# Patient Record
Sex: Female | Born: 1950 | Race: Black or African American | Hispanic: No | Marital: Married | Smoking: Former smoker
Health system: Southern US, Community
[De-identification: ages and names within clinical notes are randomized; demographics above are authoritative.]

## PROBLEM LIST (undated history)

## (undated) DIAGNOSIS — T7840XA Allergy, unspecified, initial encounter: Secondary | ICD-10-CM

## (undated) DIAGNOSIS — R7309 Other abnormal glucose: Secondary | ICD-10-CM

## (undated) DIAGNOSIS — R112 Nausea with vomiting, unspecified: Secondary | ICD-10-CM

## (undated) DIAGNOSIS — C801 Malignant (primary) neoplasm, unspecified: Secondary | ICD-10-CM

## (undated) DIAGNOSIS — J069 Acute upper respiratory infection, unspecified: Secondary | ICD-10-CM

## (undated) DIAGNOSIS — Z8041 Family history of malignant neoplasm of ovary: Secondary | ICD-10-CM

## (undated) DIAGNOSIS — D649 Anemia, unspecified: Secondary | ICD-10-CM

## (undated) DIAGNOSIS — I10 Essential (primary) hypertension: Secondary | ICD-10-CM

## (undated) DIAGNOSIS — M8580 Other specified disorders of bone density and structure, unspecified site: Secondary | ICD-10-CM

## (undated) DIAGNOSIS — Z9889 Other specified postprocedural states: Secondary | ICD-10-CM

## (undated) DIAGNOSIS — M1990 Unspecified osteoarthritis, unspecified site: Secondary | ICD-10-CM

## (undated) DIAGNOSIS — Z803 Family history of malignant neoplasm of breast: Secondary | ICD-10-CM

## (undated) DIAGNOSIS — M25559 Pain in unspecified hip: Secondary | ICD-10-CM

## (undated) HISTORY — DX: Other abnormal glucose: R73.09

## (undated) HISTORY — PX: COLONOSCOPY: SHX174

## (undated) HISTORY — DX: Other specified disorders of bone density and structure, unspecified site: M85.80

## (undated) HISTORY — DX: Essential (primary) hypertension: I10

## (undated) HISTORY — PX: COLONOSCOPY: SHX5424

## (undated) HISTORY — DX: Anemia, unspecified: D64.9

## (undated) HISTORY — DX: Family history of malignant neoplasm of breast: Z80.3

## (undated) HISTORY — DX: Other specified postprocedural states: R11.2

## (undated) HISTORY — DX: Family history of malignant neoplasm of ovary: Z80.41

## (undated) HISTORY — DX: Acute upper respiratory infection, unspecified: J06.9

## (undated) HISTORY — DX: Unspecified osteoarthritis, unspecified site: M19.90

## (undated) HISTORY — DX: Pain in unspecified hip: M25.559

## (undated) HISTORY — PX: WISDOM TOOTH EXTRACTION: SHX21

## (undated) HISTORY — DX: Allergy, unspecified, initial encounter: T78.40XA

## (undated) HISTORY — DX: Other specified postprocedural states: Z98.890

## (undated) HISTORY — PX: POLYPECTOMY: SHX149

---

## 1999-12-03 ENCOUNTER — Emergency Department (HOSPITAL_COMMUNITY): Admission: EM | Admit: 1999-12-03 | Discharge: 1999-12-04 | Payer: Self-pay | Admitting: Emergency Medicine

## 1999-12-04 ENCOUNTER — Encounter: Payer: Self-pay | Admitting: Emergency Medicine

## 2000-01-14 ENCOUNTER — Other Ambulatory Visit: Admission: RE | Admit: 2000-01-14 | Discharge: 2000-01-14 | Payer: Self-pay | Admitting: Internal Medicine

## 2002-12-17 ENCOUNTER — Other Ambulatory Visit: Admission: RE | Admit: 2002-12-17 | Discharge: 2002-12-17 | Payer: Self-pay | Admitting: Internal Medicine

## 2002-12-27 ENCOUNTER — Encounter: Admission: RE | Admit: 2002-12-27 | Discharge: 2002-12-27 | Payer: Self-pay | Admitting: Internal Medicine

## 2002-12-31 ENCOUNTER — Encounter: Payer: Self-pay | Admitting: Gastroenterology

## 2004-09-22 ENCOUNTER — Ambulatory Visit: Payer: Self-pay | Admitting: Internal Medicine

## 2005-01-12 ENCOUNTER — Ambulatory Visit: Payer: Self-pay | Admitting: Internal Medicine

## 2005-07-13 ENCOUNTER — Ambulatory Visit: Payer: Self-pay | Admitting: Internal Medicine

## 2005-10-19 ENCOUNTER — Encounter: Admission: RE | Admit: 2005-10-19 | Discharge: 2005-10-19 | Payer: Self-pay | Admitting: Internal Medicine

## 2006-08-17 ENCOUNTER — Ambulatory Visit: Payer: Self-pay | Admitting: Internal Medicine

## 2006-08-17 LAB — CONVERTED CEMR LAB
ALT: 17 units/L (ref 0–40)
AST: 22 units/L (ref 0–37)
Albumin: 3.7 g/dL (ref 3.5–5.2)
Alkaline Phosphatase: 73 units/L (ref 39–117)
BUN: 12 mg/dL (ref 6–23)
Basophils Absolute: 0 10*3/uL (ref 0.0–0.1)
Basophils Relative: 0.7 % (ref 0.0–1.0)
Bilirubin Urine: NEGATIVE
Bilirubin, Direct: 0.1 mg/dL (ref 0.0–0.3)
CO2: 29 meq/L (ref 19–32)
Calcium: 9 mg/dL (ref 8.4–10.5)
Chloride: 111 meq/L (ref 96–112)
Cholesterol: 203 mg/dL (ref 0–200)
Creatinine, Ser: 0.8 mg/dL (ref 0.4–1.2)
Direct LDL: 85.1 mg/dL
Eosinophils Absolute: 0.1 10*3/uL (ref 0.0–0.6)
Eosinophils Relative: 2.4 % (ref 0.0–5.0)
GFR calc Af Amer: 96 mL/min
GFR calc non Af Amer: 79 mL/min
Glucose, Bld: 88 mg/dL (ref 70–99)
HCT: 34.9 % — ABNORMAL LOW (ref 36.0–46.0)
HDL: 87 mg/dL (ref >39.0)
Hemoglobin, Urine: NEGATIVE
Hemoglobin: 11.9 g/dL — ABNORMAL LOW (ref 12.0–15.0)
Ketones, ur: NEGATIVE mg/dL
Leukocytes, UA: NEGATIVE
Lymphocytes Relative: 32.1 % (ref 12.0–46.0)
MCHC: 34.1 g/dL (ref 30.0–36.0)
MCV: 83.9 fL (ref 78.0–100.0)
Monocytes Absolute: 0.4 10*3/uL (ref 0.2–0.7)
Monocytes Relative: 7.4 % (ref 3.0–11.0)
Neutro Abs: 3 10*3/uL (ref 1.4–7.7)
Neutrophils Relative %: 57.4 % (ref 43.0–77.0)
Nitrite: NEGATIVE
Platelets: 270 10*3/uL (ref 150–400)
Potassium: 4.1 meq/L (ref 3.5–5.1)
RBC: 4.16 M/uL (ref 3.87–5.11)
RDW: 12.9 % (ref 11.5–14.6)
Sodium: 141 meq/L (ref 135–145)
Specific Gravity, Urine: >=1.03 (ref 1.000–1.03)
TSH: 1.75 microintl units/mL (ref 0.35–5.50)
Total Bilirubin: 0.2 mg/dL — ABNORMAL LOW (ref 0.3–1.2)
Total CHOL/HDL Ratio: 2.3
Total Protein, Urine: NEGATIVE mg/dL
Total Protein: 6.6 g/dL (ref 6.0–8.3)
Triglycerides: 47 mg/dL (ref 0–149)
Urine Glucose: NEGATIVE mg/dL
Urobilinogen, UA: 0.2 (ref 0.0–1.0)
VLDL: 9 mg/dL (ref 0–40)
WBC: 5.2 10*3/uL (ref 4.5–10.5)
pH: 5 (ref 5.0–8.0)

## 2006-08-23 ENCOUNTER — Ambulatory Visit: Payer: Self-pay | Admitting: Internal Medicine

## 2007-10-31 ENCOUNTER — Ambulatory Visit: Payer: Self-pay | Admitting: Internal Medicine

## 2007-10-31 LAB — CONVERTED CEMR LAB
ALT: 19 units/L (ref 0–35)
AST: 22 units/L (ref 0–37)
Albumin: 3.5 g/dL (ref 3.5–5.2)
Alkaline Phosphatase: 83 units/L (ref 39–117)
BUN: 12 mg/dL (ref 6–23)
Basophils Absolute: 0 10*3/uL (ref 0.0–0.1)
Basophils Relative: 0.9 % (ref 0.0–3.0)
Bilirubin Urine: NEGATIVE
Bilirubin, Direct: 0.1 mg/dL (ref 0.0–0.3)
CO2: 32 meq/L (ref 19–32)
Calcium: 8.9 mg/dL (ref 8.4–10.5)
Chloride: 110 meq/L (ref 96–112)
Cholesterol: 182 mg/dL (ref 0–200)
Creatinine, Ser: 0.7 mg/dL (ref 0.4–1.2)
Eosinophils Absolute: 0.3 10*3/uL (ref 0.0–0.7)
Eosinophils Relative: 5.4 % — ABNORMAL HIGH (ref 0.0–5.0)
GFR calc Af Amer: 111 mL/min
GFR calc non Af Amer: 92 mL/min
Glucose, Bld: 100 mg/dL — ABNORMAL HIGH (ref 70–99)
HCT: 35 % — ABNORMAL LOW (ref 36.0–46.0)
HDL: 60 mg/dL (ref >39.0)
Hemoglobin: 11.9 g/dL — ABNORMAL LOW (ref 12.0–15.0)
Ketones, ur: NEGATIVE mg/dL
LDL Cholesterol: 114 mg/dL — ABNORMAL HIGH (ref 0–99)
Leukocytes, UA: NEGATIVE
Lymphocytes Relative: 29.2 % (ref 12.0–46.0)
MCHC: 34.1 g/dL (ref 30.0–36.0)
MCV: 85.5 fL (ref 78.0–100.0)
Monocytes Absolute: 0.4 10*3/uL (ref 0.1–1.0)
Monocytes Relative: 8.5 % (ref 3.0–12.0)
Neutro Abs: 2.6 10*3/uL (ref 1.4–7.7)
Neutrophils Relative %: 56 % (ref 43.0–77.0)
Nitrite: NEGATIVE
Platelets: 286 10*3/uL (ref 150–400)
Potassium: 4.4 meq/L (ref 3.5–5.1)
RBC: 4.09 M/uL (ref 3.87–5.11)
RDW: 13.6 % (ref 11.5–14.6)
Sodium: 143 meq/L (ref 135–145)
Specific Gravity, Urine: 1.025 (ref 1.000–1.03)
TSH: 1.82 microintl units/mL (ref 0.35–5.50)
Total Bilirubin: 0.6 mg/dL (ref 0.3–1.2)
Total CHOL/HDL Ratio: 3
Total Protein, Urine: NEGATIVE mg/dL
Total Protein: 6.4 g/dL (ref 6.0–8.3)
Triglycerides: 39 mg/dL (ref 0–149)
Urine Glucose: NEGATIVE mg/dL
Urobilinogen, UA: 0.2 (ref 0.0–1.0)
VLDL: 8 mg/dL (ref 0–40)
WBC: 4.7 10*3/uL (ref 4.5–10.5)
pH: 6 (ref 5.0–8.0)

## 2007-11-07 ENCOUNTER — Ambulatory Visit: Payer: Self-pay | Admitting: Internal Medicine

## 2007-11-07 DIAGNOSIS — I10 Essential (primary) hypertension: Secondary | ICD-10-CM | POA: Insufficient documentation

## 2007-11-07 DIAGNOSIS — M1990 Unspecified osteoarthritis, unspecified site: Secondary | ICD-10-CM

## 2007-11-07 DIAGNOSIS — D649 Anemia, unspecified: Secondary | ICD-10-CM

## 2007-11-07 HISTORY — DX: Anemia, unspecified: D64.9

## 2007-11-07 HISTORY — DX: Essential (primary) hypertension: I10

## 2007-11-07 HISTORY — DX: Unspecified osteoarthritis, unspecified site: M19.90

## 2008-01-02 ENCOUNTER — Ambulatory Visit: Payer: Self-pay | Admitting: Gastroenterology

## 2008-01-08 ENCOUNTER — Ambulatory Visit: Payer: Self-pay | Admitting: Gastroenterology

## 2008-01-08 LAB — HM COLONOSCOPY

## 2008-03-12 HISTORY — PX: VAGINAL HYSTERECTOMY: SUR661

## 2008-03-19 ENCOUNTER — Ambulatory Visit (HOSPITAL_COMMUNITY): Admission: RE | Admit: 2008-03-19 | Discharge: 2008-03-20 | Payer: Self-pay | Admitting: Obstetrics and Gynecology

## 2008-03-19 ENCOUNTER — Encounter (INDEPENDENT_AMBULATORY_CARE_PROVIDER_SITE_OTHER): Payer: Self-pay | Admitting: Obstetrics and Gynecology

## 2008-12-24 ENCOUNTER — Ambulatory Visit: Payer: Self-pay | Admitting: Internal Medicine

## 2008-12-24 LAB — CONVERTED CEMR LAB
ALT: 14 units/L (ref 0–35)
AST: 18 units/L (ref 0–37)
Albumin: 3.9 g/dL (ref 3.5–5.2)
Alkaline Phosphatase: 104 units/L (ref 39–117)
BUN: 20 mg/dL (ref 6–23)
Basophils Absolute: 0 10*3/uL (ref 0.0–0.1)
Basophils Relative: 0 % (ref 0.0–3.0)
Bilirubin Urine: NEGATIVE
Bilirubin, Direct: 0.1 mg/dL (ref 0.0–0.3)
CO2: 28 meq/L (ref 19–32)
Calcium: 8.9 mg/dL (ref 8.4–10.5)
Chloride: 107 meq/L (ref 96–112)
Cholesterol: 186 mg/dL (ref 0–200)
Creatinine, Ser: 0.3 mg/dL — ABNORMAL LOW (ref 0.4–1.2)
Eosinophils Absolute: 0.3 10*3/uL (ref 0.0–0.7)
Eosinophils Relative: 4.5 % (ref 0.0–5.0)
GFR calc non Af Amer: 294.17 mL/min (ref >60)
Glucose, Bld: 87 mg/dL (ref 70–99)
HCT: 35.6 % — ABNORMAL LOW (ref 36.0–46.0)
HDL: 79.4 mg/dL (ref >39.00)
Hemoglobin: 11.9 g/dL — ABNORMAL LOW (ref 12.0–15.0)
Ketones, ur: NEGATIVE mg/dL
LDL Cholesterol: 100 mg/dL — ABNORMAL HIGH (ref 0–99)
Leukocytes, UA: NEGATIVE
Lymphocytes Relative: 28.9 % (ref 12.0–46.0)
Lymphs Abs: 1.8 10*3/uL (ref 0.7–4.0)
MCHC: 33.4 g/dL (ref 30.0–36.0)
MCV: 85.7 fL (ref 78.0–100.0)
Monocytes Absolute: 0.7 10*3/uL (ref 0.1–1.0)
Monocytes Relative: 10.5 % (ref 3.0–12.0)
Neutro Abs: 3.5 10*3/uL (ref 1.4–7.7)
Neutrophils Relative %: 56.1 % (ref 43.0–77.0)
Nitrite: NEGATIVE
Platelets: 243 10*3/uL (ref 150.0–400.0)
Potassium: 4 meq/L (ref 3.5–5.1)
RBC: 4.15 M/uL (ref 3.87–5.11)
RDW: 13.1 % (ref 11.5–14.6)
Sodium: 143 meq/L (ref 135–145)
Specific Gravity, Urine: >=1.03 (ref 1.000–1.030)
TSH: 1.31 microintl units/mL (ref 0.35–5.50)
Total Bilirubin: 0.6 mg/dL (ref 0.3–1.2)
Total CHOL/HDL Ratio: 2
Total Protein, Urine: NEGATIVE mg/dL
Total Protein: 6.8 g/dL (ref 6.0–8.3)
Triglycerides: 34 mg/dL (ref 0.0–149.0)
Urine Glucose: NEGATIVE mg/dL
Urobilinogen, UA: 0.2 (ref 0.0–1.0)
VLDL: 6.8 mg/dL (ref 0.0–40.0)
WBC: 6.3 10*3/uL (ref 4.5–10.5)
pH: 5 (ref 5.0–8.0)

## 2008-12-30 ENCOUNTER — Ambulatory Visit: Payer: Self-pay | Admitting: Internal Medicine

## 2009-04-01 LAB — HM MAMMOGRAPHY: HM Mammogram: NORMAL

## 2009-08-08 ENCOUNTER — Ambulatory Visit: Payer: Self-pay | Admitting: Endocrinology

## 2009-08-08 DIAGNOSIS — J069 Acute upper respiratory infection, unspecified: Secondary | ICD-10-CM | POA: Insufficient documentation

## 2009-08-08 HISTORY — DX: Acute upper respiratory infection, unspecified: J06.9

## 2010-02-02 ENCOUNTER — Ambulatory Visit: Payer: Self-pay | Admitting: Internal Medicine

## 2010-02-02 LAB — CONVERTED CEMR LAB
ALT: 14 units/L (ref 0–35)
AST: 17 units/L (ref 0–37)
Albumin: 4 g/dL (ref 3.5–5.2)
Alkaline Phosphatase: 102 units/L (ref 39–117)
BUN: 14 mg/dL (ref 6–23)
Basophils Absolute: 0 10*3/uL (ref 0.0–0.1)
Basophils Relative: 0.5 % (ref 0.0–3.0)
Bilirubin Urine: NEGATIVE
Bilirubin, Direct: 0.1 mg/dL (ref 0.0–0.3)
CO2: 30 meq/L (ref 19–32)
Calcium: 9.4 mg/dL (ref 8.4–10.5)
Chloride: 100 meq/L (ref 96–112)
Cholesterol: 199 mg/dL (ref 0–200)
Creatinine, Ser: 0.7 mg/dL (ref 0.4–1.2)
Eosinophils Absolute: 0.2 10*3/uL (ref 0.0–0.7)
Eosinophils Relative: 2.3 % (ref 0.0–5.0)
GFR calc non Af Amer: 108.44 mL/min (ref >30.00)
Glucose, Bld: 133 mg/dL — ABNORMAL HIGH (ref 70–99)
HCT: 35.8 % — ABNORMAL LOW (ref 36.0–46.0)
HDL: 83.1 mg/dL (ref >39.00)
Hemoglobin: 12.1 g/dL (ref 12.0–15.0)
Ketones, ur: NEGATIVE mg/dL
LDL Cholesterol: 105 mg/dL — ABNORMAL HIGH (ref 0–99)
Leukocytes, UA: NEGATIVE
Lymphocytes Relative: 26.3 % (ref 12.0–46.0)
Lymphs Abs: 1.7 10*3/uL (ref 0.7–4.0)
MCHC: 33.9 g/dL (ref 30.0–36.0)
MCV: 84.2 fL (ref 78.0–100.0)
Monocytes Absolute: 0.6 10*3/uL (ref 0.1–1.0)
Monocytes Relative: 8.6 % (ref 3.0–12.0)
Neutro Abs: 4.1 10*3/uL (ref 1.4–7.7)
Neutrophils Relative %: 62.3 % (ref 43.0–77.0)
Nitrite: NEGATIVE
Platelets: 265 10*3/uL (ref 150.0–400.0)
Potassium: 4 meq/L (ref 3.5–5.1)
RBC: 4.25 M/uL (ref 3.87–5.11)
RDW: 14 % (ref 11.5–14.6)
Sodium: 138 meq/L (ref 135–145)
Specific Gravity, Urine: 1.02 (ref 1.000–1.030)
TSH: 1.37 microintl units/mL (ref 0.35–5.50)
Total Bilirubin: 0.6 mg/dL (ref 0.3–1.2)
Total CHOL/HDL Ratio: 2
Total Protein, Urine: NEGATIVE mg/dL
Total Protein: 6.6 g/dL (ref 6.0–8.3)
Triglycerides: 55 mg/dL (ref 0.0–149.0)
Urine Glucose: NEGATIVE mg/dL
Urobilinogen, UA: 0.2 (ref 0.0–1.0)
VLDL: 11 mg/dL (ref 0.0–40.0)
WBC: 6.6 10*3/uL (ref 4.5–10.5)
pH: 6 (ref 5.0–8.0)

## 2010-02-06 ENCOUNTER — Encounter: Payer: Self-pay | Admitting: Internal Medicine

## 2010-02-06 ENCOUNTER — Ambulatory Visit: Payer: Self-pay | Admitting: Internal Medicine

## 2010-02-06 DIAGNOSIS — M25559 Pain in unspecified hip: Secondary | ICD-10-CM

## 2010-02-06 DIAGNOSIS — R7309 Other abnormal glucose: Secondary | ICD-10-CM

## 2010-02-06 HISTORY — DX: Pain in unspecified hip: M25.559

## 2010-02-06 HISTORY — DX: Other abnormal glucose: R73.09

## 2010-03-31 NOTE — Assessment & Plan Note (Signed)
Summary: DR PLOTNIKOV PT/NO SLOT COUGH- SORE THROAT STC   Vital Signs:  Patient profile:   60 year old female Height:      64 inches Weight:      137.50 pounds BMI:     23.69 O2 Sat:      96 % on Room air Temp:     99.1 degrees F oral Pulse rate:   100 / minute BP sitting:   124 / 86  (left arm) Cuff size:   regular  Vitals Entered By: Lucious Groves (August 08, 2009 4:08 PM)  O2 Flow:  Room air CC: C/O cold/sore throat x6 days./kb Is Patient Diabetic? No Pain Assessment Patient in pain? no      Comments Patient notes that she has been having fever, and yellow mucous producing cough./kb   CC:  C/O cold/sore throat x6 days./kb.  History of Present Illness: pt states 1 week of headache, chills, sore throat, prod cough, chills, nasal congestion.  fever is resolved.  Current Medications (verified): 1)  Norvasc 2.5 Mg  Tabs (Amlodipine Besylate) .... Once Daily 2)  Vitamin D3 1000 Unit  Tabs (Cholecalciferol) .Marland Kitchen.. 1 By Mouth Daily  Allergies (verified): No Known Drug Allergies  Past History:  Past Medical History: Last updated: 11/07/2007 Hypertension Osteoarthritis, hips B troch bursitis GYN DR Huntley Dec GI Dr Russella Dar Anemia-NOS,mild very long time  Social History: Reviewed history from 11/07/2007 and no changes required. 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  Physical Exam  General:  normal appearance.   Head:  head: no deformity eyes: no periorbital swelling, no proptosis external nose and ears are normal mouth: no lesion seen Neck:  Supple without thyroid enlargement or tenderness.  Lungs:  Clear to auscultation bilaterally. Normal respiratory effort.    Impression & Recommendations:  Problem # 1:  URI (ICD-465.9) Assessment New  Medications Added to Medication List This Visit: 1)  Estrace 0.1 Mg/gm Crea (Estradiol) .... Twice a week 2)  Azithromycin 500  Mg Tabs (Azithromycin) .Marland Kitchen.. 1 once daily 3)  Promethazine-codeine 6.25-10 Mg/41ml Syrp (Promethazine-codeine) .Marland Kitchen.. 1-2 teaspoon every 4 hrs as needed for cough  Other Orders: Est. Patient Level III (24401)  Patient Instructions: 1)  azithromycin 500 mg once daily 2)  loratadine-d (non-prescription) as needed for congestion. 3)  promethazine-codeine syrup as needed for cough. Prescriptions: PROMETHAZINE-CODEINE 6.25-10 MG/5ML SYRP (PROMETHAZINE-CODEINE) 1-2 teaspoon every 4 hrs as needed for cough  #8 oz x 1   Entered and Authorized by:   Minus Breeding MD   Signed by:   Minus Breeding MD on 08/08/2009   Method used:   Print then Give to Patient   RxID:   [id number] AZITHROMYCIN 500 MG TABS (AZITHROMYCIN) 1 once daily  #6 x 0   Entered and Authorized by:   Minus Breeding MD   Signed by:   Minus Breeding MD on 08/08/2009   Method used:   Electronically to        [facility]  [address] [phone]* (retail)       [address]       [place], [place]  [id number]       Ph: [phone] or [phone]       Fax: [phone]   RxID:   [id number]

## 2010-04-02 NOTE — Assessment & Plan Note (Signed)
Summary: CPX-LB   Vital Signs:  Patient profile:   60 year old female Height:      64 inches Weight:      141 pounds Temp:     98.1 degrees F oral Pulse rate:   80 / minute Pulse rhythm:   regular Resp:     16 per minute BP sitting:   120 / 70  Vitals Entered By: Lanier Prude, CMA(AAMA) (February 06, 2010 1:59 PM)  History of Present Illness: The patient presents for a preventive health examination  B hips are sore  Allergies: No Known Drug Allergies  Past History:  Past Medical History: Last updated: 11/07/2007 Hypertension Osteoarthritis, hips B troch bursitis GYN DR Huntley Dec GI Dr Russella Dar Anemia-NOS,mild very long time  Past Surgical History: Last updated: 12/30/2008  Hysterectomy, complete 2010 Dr Mirian Capuchin  Family History: Last updated: 11/07/2007 Family History Hypertension No CAD  Social History: Last updated: 11/07/2007 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  The patient denies anorexia, fever, weight loss, weight gain, vision loss, decreased hearing, hoarseness, chest pain, syncope, dyspnea on exertion, peripheral edema, prolonged cough, headaches, hemoptysis, abdominal pain, melena, hematochezia, severe indigestion/heartburn, hematuria, incontinence, genital sores, muscle weakness, suspicious skin lesions, transient blindness, depression, unusual weight change, abnormal bleeding, enlarged lymph nodes, angioedema, and breast masses.    Physical Exam  General:  Well-developed,well-nourished,in no acute distress; alert,appropriate and cooperative throughout examination Head:  Normocephalic and atraumatic without obvious abnormalities. No apparent alopecia or balding. Eyes:  No corneal or conjunctival inflammation noted. EOMI. Perrla. l. Ears:  External ear exam shows no significant lesions or deformities.  Otoscopic examination reveals  clear canals, tympanic membranes are intact bilaterally without bulging, retraction, inflammation or discharge. Hearing is grossly normal bilaterally. Nose:  External nasal examination shows no deformity or inflammation. Nasal mucosa are pink and moist without lesions or exudates. Mouth:  Oral mucosa and oropharynx without lesions or exudates.  Teeth in good repair. Neck:  No deformities, masses, or tenderness noted. Lungs:  Normal respiratory effort, chest expands symmetrically. Lungs are clear to auscultation, no crackles or wheezes. Heart:  Normal rate and regular rhythm. S1 and S2 normal without gallop, murmur, click, rub or other extra sounds. Abdomen:  Bowel sounds positive,abdomen soft and non-tender without masses, organomegaly or hernias noted. Msk:  No deformity or scoliosis noted of thoracic or lumbar spine.  B lat hips are tender Pulses:  R and L carotid,radial,femoral,dorsalis pedis and posterior tibial pulses are full and equal bilaterally Extremities:  No clubbing, cyanosis, edema, or deformity noted with normal full range of motion of all joints.   Neurologic:  No cranial nerve deficits noted. Station and gait are normal. Plantar reflexes are down-going bilaterally. DTRs are symmetrical throughout. Sensory, motor and coordinative functions appear intact. Skin:  Intact without suspicious lesions or rashes Cervical Nodes:  No lymphadenopathy noted Inguinal Nodes:  No significant adenopathy Psych:  Cognition and judgment appear intact. Alert and cooperative with normal attention span and concentration. No apparent delusions, illusions, hallucinations   Impression & Recommendations:  Problem # 1:  WELL ADULT EXAM (ICD-V70.0) Assessment New She had a BDS Orders: EKG w/ Interpretation (93000) ok The labs were reviewed with the patient.  Health and age related issues were discussed. Available screening tests and vaccinations were discussed as well. Healthy life style including good  diet and exercise was discussed.   Problem #  2:  HYPERTENSION (ICD-401.9) Assessment: Unchanged  Her updated medication list for this problem includes:    Norvasc 2.5 Mg Tabs (Amlodipine besylate) ..... Once daily  Problem # 3:  HIP PAIN (ICD-719.45) B - troch bursitis Assessment: Deteriorated Inject if worse See "Patient Instructions".  Her updated medication list for this problem includes:    Ibuprofen 600 Mg Tabs (Ibuprofen) .Marland Kitchen... 1 by mouth bid  pc x 1 wk then as needed for  pain    Tramadol Hcl 50 Mg Tabs (Tramadol hcl) .Marland Kitchen... 1-2 tabs by mouth two times a day as needed pain  Problem # 4:  HYPERGLYCEMIA (ICD-790.29)  Complete Medication List: 1)  Norvasc 2.5 Mg Tabs (Amlodipine besylate) .... Once daily 2)  Vitamin D3 1000 Unit Tabs (Cholecalciferol) .Marland Kitchen.. 1 by mouth daily 3)  Estrace 0.1 Mg/gm Crea (Estradiol) .... Twice a week 4)  Ibuprofen 600 Mg Tabs (Ibuprofen) .Marland Kitchen.. 1 by mouth bid  pc x 1 wk then as needed for  pain 5)  Tramadol Hcl 50 Mg Tabs (Tramadol hcl) .Marland Kitchen.. 1-2 tabs by mouth two times a day as needed pain  Patient Instructions: 1)  Please schedule a follow-up appointment in 6 months. 2)  BMP prior to visit, ICD-9: 3)  HbgA1C prior to visit, ICD-9:790.29 4)  Go on Youtube (www.youtube.com) and look up , "IT band stretch" and "gluteus stretch". See the anatomy and learn the symptoms.  .Do the stretches - it may help!  Prescriptions: TRAMADOL HCL 50 MG TABS (TRAMADOL HCL) 1-2 tabs by mouth two times a day as needed pain  #60 x 3   Entered and Authorized by:   Tresa Garter MD   Signed by:   Tresa Garter MD on 02/06/2010   Method used:   Electronically to        [facility] # [id number]* (retail)       [address]       [place], [place]  [address]       Ph: [phone]       Fax: [phone]   RxID:   [id number] IBUPROFEN 600 MG TABS (IBUPROFEN) 1 by mouth bid  pc x 1 wk then as needed for  pain  #60 x 3   Entered and Authorized by:    Tresa Garter MD   Signed by:   Tresa Garter MD on 02/06/2010   Method used:   Electronically to        [facility] # [id number]* (retail)       [address]       [place], [place]  [address]       Ph: [phone]       Fax: [phone]   RxID:   [id number] NORVASC 2.5 MG  TABS (AMLODIPINE BESYLATE) once daily  #30 Tablet x 11   Entered and Authorized by:   Tresa Garter MD   Signed by:   Tresa Garter MD on 02/06/2010   Method used:   Electronically to        [facility] # [id number]* (retail)       [address]       [place], [place]  [address]       Ph: [phone]       Fax: [phone]   RxID:   [id number]    Orders Added: 1)  EKG w/ Interpretation [93000] 2)  Est. Patient age 12-64 [14]   Immunization History:  Influenza Immunization History:  Influenza:  historical (12/10/2009)   Immunization History:  Influenza Immunization History:    Influenza:  Historical (12/10/2009)   Preventive Care Screening  Mammogram:    Date:  04/01/2009    Results:  normal   Pap Smear:    Date:  04/01/2009    Results:  normal

## 2010-06-15 LAB — COMPREHENSIVE METABOLIC PANEL
BUN: 15 mg/dL (ref 6–23)
CO2: 27 mEq/L (ref 19–32)
Calcium: 9.1 mg/dL (ref 8.4–10.5)
Creatinine, Ser: 0.61 mg/dL (ref 0.4–1.2)
GFR calc non Af Amer: >60 mL/min (ref >60)
Glucose, Bld: 95 mg/dL (ref 70–99)
Total Bilirubin: 0.5 mg/dL (ref 0.3–1.2)

## 2010-06-15 LAB — CBC
HCT: 31.1 % — ABNORMAL LOW (ref 36.0–46.0)
HCT: 37.9 % (ref 36.0–46.0)
Hemoglobin: 10.5 g/dL — ABNORMAL LOW (ref 12.0–15.0)
Hemoglobin: 12.6 g/dL (ref 12.0–15.0)
MCHC: 33.2 g/dL (ref 30.0–36.0)
MCV: 86.1 fL (ref 78.0–100.0)
RBC: 4.4 MIL/uL (ref 3.87–5.11)
RDW: 14.1 % (ref 11.5–15.5)
WBC: 8.2 10*3/uL (ref 4.0–10.5)

## 2010-07-14 NOTE — Op Note (Signed)
[name], [name]             ACCOUNT NO.:  [id number]   MEDICAL RECORD NO.:  [id number]          PATIENT TYPE:  OIB   LOCATION:  9309                          FACILITY:  [facility]   PHYSICIAN:  Guy Sandifer. Henderson Cloud, M.D. DATE OF BIRTH:  1950-06-24   DATE OF PROCEDURE:  03/19/2008  DATE OF DISCHARGE:                               OPERATIVE REPORT   PREOPERATIVE DIAGNOSIS:  Uterine leiomyomata.   POSTOPERATIVE DIAGNOSIS:  Uterine leiomyomata.   PROCEDURE:  Laparoscopically-assisted vaginal hysterectomy with  bilateral salpingo-oophorectomy.   SURGEON:  Guy Sandifer. Henderson Cloud, MD   ASSISTANT:  Freddy Finner, MD   ANESTHESIA:  General with endotracheal intubation.   SPECIMENS:  Uterus, bilateral tubes, and ovaries to pathology.   ESTIMATED BLOOD LOSS:  100 mL.   INDICATIONS AND CONSENT:  This patient is a 60 year old married black  female G4, P3, who has uterine leiomyomata.  There has been a suspicion  of growth.  After discussion of options, she is being admitted for  laparoscopically-assisted vaginal hysterectomy and bilateral salpingo-  oophorectomy.  Potential risks and complications were discussed with the  patient preoperatively including, but limited to infection, organ  damage, bleeding requiring transfusion of blood products with HIV and  hepatitis acquisition, DVT, PE, pneumonia, fistula formation,  laparotomy, and postoperative pain.  All questions have been answered  and consent was signed on the chart.   FINDINGS:  Upper abdomen is grossly normal.  Uterus is smooth in contour  with approximately 6-cm subserosal fibroid on the posterior uterine  wall.  Tubes and ovaries were normal bilaterally.  Anterior and  posterior cul-de-sacs were normal.   PROCEDURE IN DETAIL:  The patient was taken to the operating room where  she was identified, placed in dorsal supine position, and general  anesthesia was induced via endotracheal intubation.  She was then placed  in dorsal  lithotomy position where she was prepped abdominally and  vaginally, bladder straight catheterized.  Hulka tenaculum was placed to  the uterus as a manipulator and she was draped in a sterile fashion.  The infraumbilical and suprapubic areas were injected in the midline  with 1% plain Xylocaine.  A small infraumbilical incision was made and a  disposable Veress needle was placed on the first attempt without  difficulty.  A normal syringe and drop test are noted.  2 liters of gas  were then insufflated under low pressure with good tympany in the right  upper quadrant.  Veress needle was removed.  The disposable 12-mm trocar  sleeve was then placed using direct visualization with the diagnostic  laparoscope.  After placement, the operative laparoscope was used.  The  balloon on the trocar sleeve is insufflated.  A small suprapubic  incision is made in the midline and a 5-mm disposable trocar sleeve was  placed under direct visualization without difficulty.  The balloon on  this was also inflated out.  The above findings were noted.  Then, using  the EnSeal bipolar cautery cutting instrument, the right uterosacral  ligaments are taken down across the mesosalpinx across the round  ligament down to the level of  vesicouterine peritoneum.  Similar  procedure is carried out on the left.  Vesicouterine peritoneum was  taken down cephalad laterally.  Good hemostasis is maintained.  Instruments are removed and attention is turned to the vagina.  Posterior cul-de-sac was entered sharply and the cervix was  circumscribed with unipolar cautery.  Mucosa was advanced sharply and  bluntly.  Then, using the gyrus bipolar cautery instrument, the  uterosacral ligaments followed by the bladder pillars, round ligaments,  and uterine vessels were taken bilaterally.  The pedicles are  essentially completely taken from below.  Due to the large size of the  posterior fibroid, the fundus is delivered by rolling it  forward as  opposed to posteriorly.  This delivers the uterus.  The tubes and  ovaries were also attached.  The uterosacral ligaments were then  plicated with vaginal cuff bilaterally with separate sutures of 0  Monocryl.  All suture will be 0 Monocryl unless otherwise designated.  Uterosacral ligaments were then plicated in the midline with a third  suture.  Cuff was closed with figure-of-eights.  A reduce size Foley  catheter was used due to the narrow urethral meatus.  Clear urine is  noted.  Attention is returned to the abdomen.  Careful inspection and  irrigation reveal minor oozing at peritoneal edges, which is controlled  with bipolar cautery.  Inspection under reduced pneumoperitoneum reveals  continued hemostasis.  The excess fluid is removed.  10 mL of 1% plain  Xylocaine were then instilled into the peritoneal cavity.  Instruments  were removed after the pneumoperitoneum is reduced and the trocar  sleeves were removed.  The umbilical incision was closed first with a 0  Vicryl suture in the deeper underlying layers with good visualization.  Skin is closed with subcuticular 3-0 Vicryl suture on both incisions.  Dermabond was placed on both incisions as well as dressings.  All counts  were correct.  The patient was awakened and taken to recovery room in  stable condition.      Guy Sandifer Henderson Cloud, M.D.  Electronically Signed     JET/MEDQ  D:  03/19/2008  T:  03/19/2008  Job:  [id number]

## 2010-07-14 NOTE — H&P (Signed)
[name], [name]             ACCOUNT NO.:  [id number]   MEDICAL RECORD NO.:  [id number]          PATIENT TYPE:  AMB   LOCATION:  SDC                           [facility]ILITY:  [facility]   PHYSICIAN:  Guy Sandifer. Henderson Cloud, M.D. DATE OF BIRTH:  Oct 30, 1950   DATE OF ADMISSION:  03/19/2008  DATE OF DISCHARGE:                              HISTORY & PHYSICAL   CHIEF COMPLAINT:  Fibroid.   HISTORY OF PRESENT ILLNESS:  The patient is a 60 year old married black  female G4, P3, who on exam and ultrasound has uterine leiomyomata.  The  leiomyomata was not palpated in 2008.  On December 26, 2007, ultrasound  revealed a uterus measuring 7.0 x 4.1 x 7.0 cm with a 6-cm subserosal  fibroid as well as smaller subserosal fibroids.  Ovaries appear normal.  Because of apparent growth of her uterine leiomyomata, she is being  admitted for laparoscopically-assisted vaginal hysterectomy and  bilateral salpingo-oophorectomy.  Potential risks and complications have  been reviewed preoperatively, all questions have been answered.   PAST MEDICAL HISTORY:  1. History of anemia.  2. Diverticulosis.  3. Arthritis.  4. Chronic hypertension.  5. Colon polyps.   PAST SURGICAL HISTORY:  Negative.   OBSTETRICAL HISTORY:  Vaginal delivery x4.   MEDICATIONS:  Amlodipine 1 tablet daily.   ALLERGIES:  No known drug allergies.   SOCIAL HISTORY:  Denies tobacco, alcohol, or drug abuse.   FAMILY HISTORY:  1. Heart disease.  2. Diabetes.  3. Cancer.   REVIEW OF SYSTEMS:  NEURO:  Denies headache.  CARDIO:  Denies chest  pain.  PULMONARY:  Denies shortness of breath.   PHYSICAL EXAMINATION:  VITAL SIGNS:  Height 5 feet 4-1/2 inches, weight  136 pounds, blood pressure 118/74.  HEENT: Without thyromegaly.  LUNGS:  Clear to auscultation.  Her regular rate and rhythm  BACK:  No CVA tenderness.  ABDOMEN: Soft, nontender without masses.  PELVIC EXAM:  Vulva, vagina, cervix without lesion.  Uterus is acutely  retroflexed  with a mass effect toward the right adnexa.  Adnexal exam  compromised.  EXTREMITIES:  Grossly within normal limits.  NEUROLOGICAL:  Exam grossly within normal limits.   ASSESSMENT:  Uterine leiomyomata.   PLAN:  Laparoscopically-assisted vaginal hysterectomy with bilateral  salpingo-oophorectomy.      Guy Sandifer Henderson Cloud, M.D.  Electronically Signed     JET/MEDQ  D:  03/18/2008  T:  03/18/2008  Job:  [id number]

## 2010-07-14 NOTE — Assessment & Plan Note (Signed)
[facility]                           PRIMARY CARE OFFICE NOTE   NAME:[name], Vanschaick                      MRN:          [id number]  DATE:08/23/2006                            DOB:          [birth year]    The patient is 60 year old female who presents for wellness examination.   ALLERGIES:  None.   Past medical history, family history, and social history per December 17, 2002.   CURRENT MEDICATIONS:  Reviewed.   REVIEW OF SYSTEMS:  No chest pain. No shortness of breath. Left leg pain  for a number of years mostly in the hip area. No lung symptoms. No  cough. The rest of the 18-point review of systems is negative.   PHYSICAL EXAMINATION:  Blood pressure 141/91, pulse 72, temperature  97.6, weight 132 pounds. She looks well.  HEENT: Moist mucosa.  NECK: Supple without thyromegaly or bruits.  LUNGS: Clear to auscultation and percussion. No wheezing or rales.  HEART: S1, S2. No murmur. No gallop.  ABDOMEN: Soft, nontender. No organomegaly felt .  LOWER EXTREMITIES: Without edema. Left hip tender to palpation over left  trochanteric  bursa, otherwise full range of motion.  SKIN: Clear.  She is alert, oriented, and cooperative. Denies being depressed.   LABORATORIES:  On August 18, 2006, CBC normal, CMET normal, EKG normal,  Cholesterol 203, LDL 85, HDL 87, TSH normal, urinanalysis normal.  Colonoscopy was done in 2004, reviewed.   ASSESSMENT/PLAN:  1. Normal wellness examination, age/health issuesdiscussed, lifestyle      discussed. Her EKG is normal today. Her gynecological appointment      is pending in July . Obtain bone density scan, calcium and vitamin      D. . Other health issues discussed.  2. 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 She will use ice, course of ibuprofen 600 p.o. b.i.d. We      will inject if no improvement in a month. Obtain x-      ray.  3. History of hypertension, she is on  Norvasc 2.5 mg daily. I will see      her back in a couple of months.     Georgina Quint. Plotnikov, MD  Electronically Signed    AVP/MedQ  DD: 08/25/2006  DT: 08/25/2006  Job #: [id number]

## 2010-07-14 NOTE — Discharge Summary (Signed)
NAMEMANAL, KREUTZER             ACCOUNT NO.:  [id number]   MEDICAL RECORD NO.:  [id number]          PATIENT TYPE:  OIB   LOCATION:  9309                          [facility]ILITY:  [facility]   PHYSICIAN:  Guy Sandifer. Henderson Cloud, M.D. DATE OF BIRTH:  1950/04/15   DATE OF ADMISSION:  03/19/2008  DATE OF DISCHARGE:  03/20/2008                               DISCHARGE SUMMARY   PROCEDURE:  On March 19, 2008, laparoscopically-assisted vaginal  hysterectomy with bilateral salpingo-oophorectomy.   REASON FOR ADMISSION:  This patient is a 60 year old married black  female G4, P3 with leiomyomata.  Details are dictated in the history and  physical.  She was admitted for surgical management.   HOSPITAL COURSE:  The patient was admitted to the hospital to undergo  the above procedure.  Estimated blood loss was 100 mL.  On the evening  of surgery, she had some nausea.  Vital signs were stable.  She was  afebrile with clear urine output.  She was ambulating well.  On the day  of discharge, she was feeling much better, tolerating regular diet, and  ambulating well.  She is voiding.  Vital signs are stable.  She is  afebrile.  Hemoglobin is 10.5.  Abdomen is soft with good bowel sounds.   CONDITION ON DISCHARGE:  Good.   DIET:  Regular as tolerated.   ACTIVITY:  No lifting.  No operation of automobiles.  No vaginal entry.  She is to call the office for problems including, not limited to  temperature 101 degrees, increasing pain, persistent nausea, vomiting,  or heavy bleeding.   MEDICATIONS:  1. Ibuprofen 600 mg q.6 h. p.r.n.  2. Vicodin #30 one to two p.o. q.6 h. p.r.n.  3. Multivitamin daily.   Followup is in the office in 2 weeks.      Guy Sandifer Henderson Cloud, M.D.  Electronically Signed     JET/MEDQ  D:  03/20/2008  T:  03/21/2008  Job:  [id number]

## 2011-01-13 ENCOUNTER — Telehealth: Payer: Self-pay | Admitting: Internal Medicine

## 2011-01-13 NOTE — Telephone Encounter (Signed)
The pt called and is requesting a refill on her blood pressure medicine.  She did not know the name.  She also did not know which pharmacy.  Here is her contact number - [phone]  Thanks!

## 2011-01-13 NOTE — Telephone Encounter (Signed)
Pt's last OV was 01/2010 with AVP. He wanted her back in 6 mo. There are 3 different pharmacies in old EMR chart. I called pt to see which dru and pharmacy is correct and advised her she needs OV with PCP for his next available opening as she is past due for f/u.

## 2011-01-14 MED ORDER — AMLODIPINE BESYLATE 2.5 MG PO TABS: 2.5000 mg | ORAL_TABLET | Freq: Every day | ORAL | Status: DC

## 2011-01-14 NOTE — Telephone Encounter (Signed)
Pt left vm stating she needs Rf on Norvasc 2.5mg . She wants R[facility] sent to [facility] [address].

## 2011-03-09 ENCOUNTER — Telehealth: Payer: Self-pay | Admitting: *Deleted

## 2011-03-09 DIAGNOSIS — Z0000 Encounter for general adult medical examination without abnormal findings: Secondary | ICD-10-CM

## 2011-03-09 NOTE — Telephone Encounter (Signed)
CPE labs entered.  

## 2011-03-09 NOTE — Telephone Encounter (Signed)
Message copied by Merrilyn Puma on Tue Mar 09, 2011  1:42 PM ------      Message from: Newell Coral      Created: Caleen Essex Mar 05, 2011  1:09 PM      Regarding: cpe sche       This pt scheduled their cpe for the 16th and is hoping to get labs done before.  Thanks

## 2011-03-10 ENCOUNTER — Other Ambulatory Visit (INDEPENDENT_AMBULATORY_CARE_PROVIDER_SITE_OTHER): Payer: Self-pay

## 2011-03-10 ENCOUNTER — Other Ambulatory Visit: Payer: Self-pay | Admitting: Internal Medicine

## 2011-03-10 DIAGNOSIS — Z0000 Encounter for general adult medical examination without abnormal findings: Secondary | ICD-10-CM

## 2011-03-10 LAB — LIPID PANEL
HDL: 89.6 mg/dL (ref >39.00)
Total CHOL/HDL Ratio: 2
Triglycerides: 45 mg/dL (ref 0.0–149.0)

## 2011-03-10 LAB — CBC WITH DIFFERENTIAL/PLATELET
Basophils Relative: 0.5 % (ref 0.0–3.0)
Eosinophils Absolute: 0.3 10*3/uL (ref 0.0–0.7)
Eosinophils Relative: 4.4 % (ref 0.0–5.0)
Hemoglobin: 12.1 g/dL (ref 12.0–15.0)
Lymphocytes Relative: 33.9 % (ref 12.0–46.0)
MCHC: 33.5 g/dL (ref 30.0–36.0)
MCV: 84.6 fl (ref 78.0–100.0)
Monocytes Absolute: 0.4 10*3/uL (ref 0.1–1.0)
Neutro Abs: 3.1 10*3/uL (ref 1.4–7.7)
RBC: 4.28 Mil/uL (ref 3.87–5.11)
WBC: 5.7 10*3/uL (ref 4.5–10.5)

## 2011-03-10 LAB — BASIC METABOLIC PANEL
Calcium: 9.2 mg/dL (ref 8.4–10.5)
GFR: 92.79 mL/min (ref >60.00)
Glucose, Bld: 77 mg/dL (ref 70–99)
Potassium: 4.3 mEq/L (ref 3.5–5.1)
Sodium: 142 mEq/L (ref 135–145)

## 2011-03-10 LAB — URINALYSIS, ROUTINE W REFLEX MICROSCOPIC
Bilirubin Urine: NEGATIVE
Ketones, ur: NEGATIVE
Leukocytes, UA: NEGATIVE
Specific Gravity, Urine: >=1.03 (ref 1.000–1.030)
Total Protein, Urine: NEGATIVE
Urine Glucose: NEGATIVE
pH: 5.5 (ref 5.0–8.0)

## 2011-03-10 LAB — HEPATIC FUNCTION PANEL
AST: 20 U/L (ref 0–37)
Albumin: 4.1 g/dL (ref 3.5–5.2)
Alkaline Phosphatase: 98 U/L (ref 39–117)
Bilirubin, Direct: 0.1 mg/dL (ref 0.0–0.3)
Total Bilirubin: 0.7 mg/dL (ref 0.3–1.2)

## 2011-03-10 LAB — LDL CHOLESTEROL, DIRECT: Direct LDL: 97.4 mg/dL

## 2011-03-15 ENCOUNTER — Encounter: Payer: Self-pay | Admitting: Internal Medicine

## 2011-03-17 ENCOUNTER — Encounter: Payer: Self-pay | Admitting: Internal Medicine

## 2011-03-17 ENCOUNTER — Ambulatory Visit (INDEPENDENT_AMBULATORY_CARE_PROVIDER_SITE_OTHER): Payer: Federal, State, Local not specified - PPO | Admitting: Internal Medicine

## 2011-03-17 DIAGNOSIS — M25559 Pain in unspecified hip: Secondary | ICD-10-CM

## 2011-03-17 DIAGNOSIS — Z0000 Encounter for general adult medical examination without abnormal findings: Secondary | ICD-10-CM

## 2011-03-17 DIAGNOSIS — I10 Essential (primary) hypertension: Secondary | ICD-10-CM

## 2011-03-17 MED ORDER — AMLODIPINE BESYLATE 2.5 MG PO TABS: 2.5000 mg | ORAL_TABLET | Freq: Every day | ORAL | Status: DC

## 2011-03-17 NOTE — Assessment & Plan Note (Signed)
[facility] chronic Start yoga PT suggested Naproxen prn

## 2011-03-17 NOTE — Assessment & Plan Note (Signed)
Continue with current prescription therapy as reflected on the Med list.  

## 2011-03-17 NOTE — Progress Notes (Signed)
  Subjective:    Patient ID: [name], female    DOB: 1951/01/08, 61 y.o.   MRN: [id number]  HPI  The patient is here for a wellness exam. The patient has been doing well overall without major physical or psychological issues going on lately. C/o B hip pain and stiffness... The patient needs to address  chronic hypertension  Review of Systems  Constitutional: Negative.  Negative for fever, chills, diaphoresis, activity change, appetite change, fatigue and unexpected weight change.  HENT: Negative for hearing loss, ear pain, nosebleeds, congestion, sore throat, facial swelling, rhinorrhea, sneezing, mouth sores, trouble swallowing, neck pain, neck stiffness, postnasal drip, sinus pressure and tinnitus.   Eyes: Negative for pain, discharge, redness, itching and visual disturbance.  Respiratory: Negative for cough, chest tightness, shortness of breath, wheezing and stridor.   Cardiovascular: Negative for chest pain, palpitations and leg swelling.  Gastrointestinal: Negative for nausea, diarrhea, constipation, blood in stool, abdominal distention, anal bleeding and rectal pain.  Genitourinary: Negative for dysuria, urgency, frequency, hematuria, flank pain, vaginal bleeding, vaginal discharge, difficulty urinating, genital sores and pelvic pain.  Musculoskeletal: Positive for arthralgias (hips). Negative for back pain, joint swelling and gait problem.  Skin: Negative.  Negative for rash.  Neurological: Negative for dizziness, tremors, seizures, syncope, speech difficulty, weakness, numbness and headaches.  Hematological: Negative for adenopathy. Does not bruise/bleed easily.  Psychiatric/Behavioral: Negative for suicidal ideas, behavioral problems, sleep disturbance, dysphoric mood and decreased concentration. The patient is not nervous/anxious.        Objective:   Physical Exam  Constitutional: She appears well-developed. No distress.  HENT:  Head: Normocephalic.  Right Ear: External  ear normal.  Left Ear: External ear normal.  Nose: Nose normal.  Mouth/Throat: Oropharynx is clear and moist.  Eyes: Conjunctivae are normal. Pupils are equal, round, and reactive to light. Right eye exhibits no discharge. Left eye exhibits no discharge.  Neck: Normal range of motion. Neck supple. No JVD present. No tracheal deviation present. No thyromegaly present.  Cardiovascular: Normal rate, regular rhythm and normal heart sounds.   Pulmonary/Chest: No stridor. No respiratory distress. She has no wheezes.  Abdominal: Soft. Bowel sounds are normal. She exhibits no distension and no mass. There is no tenderness. There is no rebound and no guarding.  Musculoskeletal: She exhibits tenderness (B hips are stiff and tender in the groin and in troch bursa areas). She exhibits no edema.       LS is ok  Lymphadenopathy:    She has no cervical adenopathy.  Neurological: She displays normal reflexes. No cranial nerve deficit. She exhibits normal muscle tone. Coordination normal.  Skin: No rash noted. No erythema.  Psychiatric: She has a normal mood and affect. Her behavior is normal. Judgment and thought content normal.    Lab Results  Component Value Date   WBC 5.7 03/10/2011   HGB 12.1 03/10/2011   HCT 36.3 03/10/2011   PLT 265.0 03/10/2011   GLUCOSE 77 03/10/2011   CHOL 208* 03/10/2011   TRIG 45.0 03/10/2011   HDL 89.60 03/10/2011   LDLDIRECT 97.4 03/10/2011   LDLCALC 105* 02/02/2010   ALT 18 03/10/2011   AST 20 03/10/2011   NA 142 03/10/2011   K 4.3 03/10/2011   CL 103 03/10/2011   CREATININE 0.8 03/10/2011   BUN 13 03/10/2011   CO2 30 03/10/2011   TSH 1.17 03/10/2011         Assessment & Plan:

## 2011-03-17 NOTE — Assessment & Plan Note (Signed)
We discussed age appropriate health related issues, including available/recomended screening tests and vaccinations. We discussed a need for adhering to healthy diet and exercise. Labs/EKG were reviewed/ordered. All questions were answered.   

## 2012-03-22 ENCOUNTER — Other Ambulatory Visit (INDEPENDENT_AMBULATORY_CARE_PROVIDER_SITE_OTHER): Payer: Federal, State, Local not specified - PPO

## 2012-03-22 ENCOUNTER — Other Ambulatory Visit: Payer: Self-pay | Admitting: *Deleted

## 2012-03-22 DIAGNOSIS — Z0000 Encounter for general adult medical examination without abnormal findings: Secondary | ICD-10-CM

## 2012-03-22 LAB — CBC WITH DIFFERENTIAL/PLATELET
Basophils Absolute: 0.1 10*3/uL (ref 0.0–0.1)
Basophils Relative: 0.9 % (ref 0.0–3.0)
Eosinophils Absolute: 0.2 10*3/uL (ref 0.0–0.7)
Lymphocytes Relative: 34.9 % (ref 12.0–46.0)
MCHC: 32.6 g/dL (ref 30.0–36.0)
MCV: 84.1 fl (ref 78.0–100.0)
Monocytes Absolute: 0.4 10*3/uL (ref 0.1–1.0)
Neutrophils Relative %: 54 % (ref 43.0–77.0)
RBC: 4.54 Mil/uL (ref 3.87–5.11)
RDW: 14.2 % (ref 11.5–14.6)

## 2012-03-22 LAB — HEPATIC FUNCTION PANEL
AST: 20 U/L (ref 0–37)
Albumin: 4.1 g/dL (ref 3.5–5.2)
Alkaline Phosphatase: 97 U/L (ref 39–117)
Total Protein: 7.4 g/dL (ref 6.0–8.3)

## 2012-03-22 LAB — BASIC METABOLIC PANEL
BUN: 16 mg/dL (ref 6–23)
CO2: 30 mEq/L (ref 19–32)
Calcium: 9.6 mg/dL (ref 8.4–10.5)
Glucose, Bld: 76 mg/dL (ref 70–99)
Potassium: 4.2 mEq/L (ref 3.5–5.1)
Sodium: 140 mEq/L (ref 135–145)

## 2012-03-22 LAB — LIPID PANEL
HDL: 88.5 mg/dL (ref >39.00)
Triglycerides: 46 mg/dL (ref 0.0–149.0)

## 2012-03-22 LAB — URINALYSIS, ROUTINE W REFLEX MICROSCOPIC
Bilirubin Urine: NEGATIVE
Ketones, ur: NEGATIVE
Leukocytes, UA: NEGATIVE
Nitrite: NEGATIVE
Urobilinogen, UA: 0.2 (ref 0.0–1.0)

## 2012-03-22 LAB — LDL CHOLESTEROL, DIRECT: Direct LDL: 90.1 mg/dL

## 2012-03-29 ENCOUNTER — Other Ambulatory Visit (INDEPENDENT_AMBULATORY_CARE_PROVIDER_SITE_OTHER): Payer: Federal, State, Local not specified - PPO

## 2012-03-29 ENCOUNTER — Encounter: Payer: Self-pay | Admitting: Internal Medicine

## 2012-03-29 ENCOUNTER — Ambulatory Visit (INDEPENDENT_AMBULATORY_CARE_PROVIDER_SITE_OTHER): Payer: Federal, State, Local not specified - PPO | Admitting: Internal Medicine

## 2012-03-29 ENCOUNTER — Telehealth: Payer: Self-pay | Admitting: Internal Medicine

## 2012-03-29 ENCOUNTER — Ambulatory Visit (INDEPENDENT_AMBULATORY_CARE_PROVIDER_SITE_OTHER)
Admission: RE | Admit: 2012-03-29 | Discharge: 2012-03-29 | Disposition: A | Discharge status: Home / Self Care | Payer: Federal, State, Local not specified - PPO | Source: Ambulatory Visit | Attending: Internal Medicine | Admitting: Internal Medicine

## 2012-03-29 VITALS — BP 138/90 | HR 72 | Temp 98.0°F | Resp 16 | Ht 64.0 in | Wt 136.0 lb

## 2012-03-29 DIAGNOSIS — Z23 Encounter for immunization: Secondary | ICD-10-CM

## 2012-03-29 DIAGNOSIS — M797 Fibromyalgia: Secondary | ICD-10-CM | POA: Insufficient documentation

## 2012-03-29 DIAGNOSIS — M79643 Pain in unspecified hand: Secondary | ICD-10-CM | POA: Insufficient documentation

## 2012-03-29 DIAGNOSIS — Z2911 Encounter for prophylactic immunotherapy for respiratory syncytial virus (RSV): Secondary | ICD-10-CM

## 2012-03-29 DIAGNOSIS — M79609 Pain in unspecified limb: Secondary | ICD-10-CM

## 2012-03-29 DIAGNOSIS — I10 Essential (primary) hypertension: Secondary | ICD-10-CM

## 2012-03-29 DIAGNOSIS — IMO0001 Reserved for inherently not codable concepts without codable children: Secondary | ICD-10-CM

## 2012-03-29 DIAGNOSIS — Z0000 Encounter for general adult medical examination without abnormal findings: Secondary | ICD-10-CM

## 2012-03-29 LAB — SEDIMENTATION RATE: Sed Rate: 20 mm/hr (ref 0–22)

## 2012-03-29 MED ORDER — PREGABALIN 50 MG PO CAPS: 50.0000 mg | ORAL_CAPSULE | Freq: Three times a day (TID) | ORAL | Status: DC

## 2012-03-29 MED ORDER — AMLODIPINE BESYLATE 2.5 MG PO TABS: 2.5000 mg | ORAL_TABLET | Freq: Every day | ORAL | Status: DC

## 2012-03-29 MED ORDER — IBUPROFEN 600 MG PO TABS: 600.0000 mg | ORAL_TABLET | Freq: Three times a day (TID) | ORAL | Status: DC | PRN

## 2012-03-29 MED ORDER — TRAMADOL HCL 50 MG PO TABS: 50.0000 mg | ORAL_TABLET | Freq: Two times a day (BID) | ORAL | Status: DC | PRN

## 2012-03-29 NOTE — Assessment & Plan Note (Addendum)
Start yoga Labs Tramadol intolerant Will try Lyrica Yoga adviced PT offered

## 2012-03-29 NOTE — Assessment & Plan Note (Signed)
Continue with current prescription therapy as reflected on the Med list.  

## 2012-03-29 NOTE — Progress Notes (Signed)
Subjective:    HPI  The patient is here for a wellness exam. The patient has been doing well overall without major physical or psychological issues going on lately. C/o B hip pain and stiffness x years;  8/10 pain at times... The patient needs to address  chronic hypertension. C/o R hand pain x weeks. C/o fatigue  Wt Readings from Last 3 Encounters:  03/29/12 136 lb (61.689 kg)  03/17/11 143 lb (64.864 kg)  02/06/10 141 lb (63.957 kg)   BP Readings from Last 3 Encounters:  03/29/12 138/90  03/17/11 130/90  02/06/10 120/70     Review of Systems  Constitutional: Negative.  Negative for fever, chills, diaphoresis, activity change, appetite change, fatigue and unexpected weight change.  HENT: Negative for hearing loss, ear pain, nosebleeds, congestion, sore throat, facial swelling, rhinorrhea, sneezing, mouth sores, trouble swallowing, neck pain, neck stiffness, postnasal drip, sinus pressure and tinnitus.   Eyes: Negative for pain, discharge, redness, itching and visual disturbance.  Respiratory: Negative for cough, chest tightness, shortness of breath, wheezing and stridor.   Cardiovascular: Negative for chest pain, palpitations and leg swelling.  Gastrointestinal: Negative for nausea, diarrhea, constipation, blood in stool, abdominal distention, anal bleeding and rectal pain.  Genitourinary: Negative for dysuria, urgency, frequency, hematuria, flank pain, vaginal bleeding, vaginal discharge, difficulty urinating, genital sores and pelvic pain.  Musculoskeletal: Positive for arthralgias (hips). Negative for back pain, joint swelling and gait problem.  Skin: Negative.  Negative for rash.  Neurological: Negative for dizziness, tremors, seizures, syncope, speech difficulty, weakness, numbness and headaches.  Hematological: Negative for adenopathy. Does not bruise/bleed easily.  Psychiatric/Behavioral: Negative for suicidal ideas, behavioral problems, sleep disturbance, dysphoric mood  and decreased concentration. The patient is not nervous/anxious.        Objective:   Physical Exam  Constitutional: She appears well-developed. No distress.  HENT:  Head: Normocephalic.  Right Ear: External ear normal.  Left Ear: External ear normal.  Nose: Nose normal.  Mouth/Throat: Oropharynx is clear and moist.  Eyes: Conjunctivae normal are normal. Pupils are equal, round, and reactive to light. Right eye exhibits no discharge. Left eye exhibits no discharge.  Neck: Normal range of motion. Neck supple. No JVD present. No tracheal deviation present. No thyromegaly present.  Cardiovascular: Normal rate, regular rhythm and normal heart sounds.   Pulmonary/Chest: No stridor. No respiratory distress. She has no wheezes.  Abdominal: Soft. Bowel sounds are normal. She exhibits no distension and no mass. There is no tenderness. There is no rebound and no guarding.  Musculoskeletal: She exhibits tenderness (B hips are stiff and tender in the groin and in troch bursa areas). She exhibits no edema.       LS is ok  Lymphadenopathy:    She has no cervical adenopathy.  Neurological: She displays normal reflexes. No cranial nerve deficit. She exhibits normal muscle tone. Coordination normal.  Skin: No rash noted. No erythema.  Psychiatric: She has a normal mood and affect. Her behavior is normal. Judgment and thought content normal.    Lab Results  Component Value Date   WBC 6.5 03/22/2012   HGB 12.5 03/22/2012   HCT 38.2 03/22/2012   PLT 269.0 03/22/2012   GLUCOSE 76 03/22/2012   CHOL 201* 03/22/2012   TRIG 46.0 03/22/2012   HDL 88.50 03/22/2012   LDLDIRECT 90.1 03/22/2012   LDLCALC 105* 02/02/2010   ALT 19 03/22/2012   AST 20 03/22/2012   NA 140 03/22/2012   K 4.2 03/22/2012   CL 103  03/22/2012   CREATININE 0.7 03/22/2012   BUN 16 03/22/2012   CO2 30 03/22/2012   TSH 1.26 03/22/2012         Assessment & Plan:

## 2012-03-29 NOTE — Telephone Encounter (Signed)
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

## 2012-03-29 NOTE — Assessment & Plan Note (Signed)
X ray Labs 

## 2012-03-29 NOTE — Assessment & Plan Note (Signed)
We discussed age appropriate health related issues, including available/recomended screening tests and vaccinations. We discussed a need for adhering to healthy diet and exercise. Labs/EKG were reviewed/ordered. All questions were answered.   

## 2012-03-30 NOTE — Telephone Encounter (Signed)
Pt informed

## 2012-03-31 LAB — ANA: Anti Nuclear Antibody(ANA): NEGATIVE

## 2012-08-29 ENCOUNTER — Telehealth: Payer: Self-pay | Admitting: *Deleted

## 2012-08-29 NOTE — Telephone Encounter (Signed)
Pt called states she is having continual leg pain and is requesting an Xray.  Please advise

## 2012-08-30 NOTE — Telephone Encounter (Signed)
pls shc OV  Thx

## 2012-08-30 NOTE — Telephone Encounter (Signed)
Left message on pt's VM to call to call office to schedule appoint for leg pain.

## 2012-10-19 ENCOUNTER — Encounter: Payer: Self-pay | Admitting: Gastroenterology

## 2012-11-29 LAB — HM MAMMOGRAPHY

## 2013-02-14 ENCOUNTER — Encounter: Payer: Self-pay | Admitting: Gastroenterology

## 2013-04-04 ENCOUNTER — Ambulatory Visit (INDEPENDENT_AMBULATORY_CARE_PROVIDER_SITE_OTHER): Payer: Federal, State, Local not specified - PPO | Admitting: Internal Medicine

## 2013-04-04 ENCOUNTER — Other Ambulatory Visit (INDEPENDENT_AMBULATORY_CARE_PROVIDER_SITE_OTHER): Payer: Federal, State, Local not specified - PPO

## 2013-04-04 ENCOUNTER — Ambulatory Visit (AMBULATORY_SURGERY_CENTER): Payer: Self-pay | Admitting: *Deleted

## 2013-04-04 ENCOUNTER — Encounter: Payer: Self-pay | Admitting: Internal Medicine

## 2013-04-04 VITALS — Ht 64.5 in | Wt 139.8 lb

## 2013-04-04 VITALS — BP 150/98 | HR 76 | Temp 97.8°F | Resp 16 | Ht 64.5 in | Wt 139.0 lb

## 2013-04-04 DIAGNOSIS — Z0000 Encounter for general adult medical examination without abnormal findings: Secondary | ICD-10-CM

## 2013-04-04 DIAGNOSIS — I10 Essential (primary) hypertension: Secondary | ICD-10-CM

## 2013-04-04 DIAGNOSIS — Z860100 Personal history of colon polyps, unspecified: Secondary | ICD-10-CM

## 2013-04-04 LAB — CBC WITH DIFFERENTIAL/PLATELET
BASOS PCT: 0.5 % (ref 0.0–3.0)
Basophils Absolute: 0 10*3/uL (ref 0.0–0.1)
Eosinophils Absolute: 0.3 10*3/uL (ref 0.0–0.7)
Eosinophils Relative: 3.8 % (ref 0.0–5.0)
HCT: 40 % (ref 36.0–46.0)
HEMOGLOBIN: 12.9 g/dL (ref 12.0–15.0)
Lymphocytes Relative: 29.3 % (ref 12.0–46.0)
Lymphs Abs: 2.2 10*3/uL (ref 0.7–4.0)
MCHC: 32.2 g/dL (ref 30.0–36.0)
MCV: 85.4 fl (ref 78.0–100.0)
Monocytes Absolute: 0.6 10*3/uL (ref 0.1–1.0)
Monocytes Relative: 7.9 % (ref 3.0–12.0)
NEUTROS ABS: 4.4 10*3/uL (ref 1.4–7.7)
NEUTROS PCT: 58.5 % (ref 43.0–77.0)
Platelets: 320 10*3/uL (ref 150.0–400.0)
RBC: 4.69 Mil/uL (ref 3.87–5.11)
RDW: 14.3 % (ref 11.5–14.6)
WBC: 7.5 10*3/uL (ref 4.5–10.5)

## 2013-04-04 LAB — URINALYSIS
Bilirubin Urine: NEGATIVE
KETONES UR: 15 — AB
Leukocytes, UA: NEGATIVE
NITRITE: NEGATIVE
Specific Gravity, Urine: 1.025 (ref 1.000–1.030)
Total Protein, Urine: NEGATIVE
UROBILINOGEN UA: 0.2 (ref 0.0–1.0)
Urine Glucose: NEGATIVE
pH: 6 (ref 5.0–8.0)

## 2013-04-04 MED ORDER — AMLODIPINE BESYLATE 2.5 MG PO TABS: 2.5000 mg | ORAL_TABLET | Freq: Every day | ORAL | Status: DC

## 2013-04-04 MED ORDER — MOVIPREP 100 G PO SOLR: ORAL | Status: DC

## 2013-04-04 NOTE — Progress Notes (Signed)
Subjective:    HPI  The patient is here for a wellness exam. The patient has been doing well overall without major physical or psychological issues going on lately. C/o B hip pain and stiffness x years;  8/10 pain at times... C/o fatigue BP is nl at home  Wt Readings from Last 3 Encounters:  04/04/13 139 lb (63.05 kg)  04/04/13 139 lb 12.8 oz (63.413 kg)  03/29/12 136 lb (61.689 kg)   BP Readings from Last 3 Encounters:  04/04/13 150/98  03/29/12 138/90  03/17/11 130/90     Review of Systems  Constitutional: Negative.  Negative for fever, chills, diaphoresis, activity change, appetite change, fatigue and unexpected weight change.  HENT: Negative for congestion, ear pain, facial swelling, hearing loss, mouth sores, nosebleeds, postnasal drip, rhinorrhea, sinus pressure, sneezing, sore throat, tinnitus and trouble swallowing.   Eyes: Negative for pain, discharge, redness, itching and visual disturbance.  Respiratory: Negative for cough, chest tightness, shortness of breath, wheezing and stridor.   Cardiovascular: Negative for chest pain, palpitations and leg swelling.  Gastrointestinal: Negative for nausea, diarrhea, constipation, blood in stool, abdominal distention, anal bleeding and rectal pain.  Genitourinary: Negative for dysuria, urgency, frequency, hematuria, flank pain, vaginal bleeding, vaginal discharge, difficulty urinating, genital sores and pelvic pain.  Musculoskeletal: Positive for arthralgias (hips). Negative for back pain, gait problem, joint swelling, neck pain and neck stiffness.  Skin: Negative.  Negative for rash.  Neurological: Negative for dizziness, tremors, seizures, syncope, speech difficulty, weakness, numbness and headaches.  Hematological: Negative for adenopathy. Does not bruise/bleed easily.  Psychiatric/Behavioral: Negative for suicidal ideas, behavioral problems, sleep disturbance, dysphoric mood and decreased concentration. The patient is not  nervous/anxious.        Objective:   Physical Exam  Constitutional: She appears well-developed. No distress.  HENT:  Head: Normocephalic.  Right Ear: External ear normal.  Left Ear: External ear normal.  Nose: Nose normal.  Mouth/Throat: Oropharynx is clear and moist.  Eyes: Conjunctivae are normal. Pupils are equal, round, and reactive to light. Right eye exhibits no discharge. Left eye exhibits no discharge.  Neck: Normal range of motion. Neck supple. No JVD present. No tracheal deviation present. No thyromegaly present.  Cardiovascular: Normal rate, regular rhythm and normal heart sounds.   Pulmonary/Chest: No stridor. No respiratory distress. She has no wheezes.  Abdominal: Soft. Bowel sounds are normal. She exhibits no distension and no mass. There is no tenderness. There is no rebound and no guarding.  Musculoskeletal: She exhibits tenderness (B hips are stiff and tender in the groin and in troch bursa areas). She exhibits no edema.  LS is ok  Lymphadenopathy:    She has no cervical adenopathy.  Neurological: She displays normal reflexes. No cranial nerve deficit. She exhibits normal muscle tone. Coordination normal.  Skin: No rash noted. No erythema.  Psychiatric: She has a normal mood and affect. Her behavior is normal. Judgment and thought content normal.    Lab Results  Component Value Date   WBC 6.5 03/22/2012   HGB 12.5 03/22/2012   HCT 38.2 03/22/2012   PLT 269.0 03/22/2012   GLUCOSE 76 03/22/2012   CHOL 201* 03/22/2012   TRIG 46.0 03/22/2012   HDL 88.50 03/22/2012   LDLDIRECT 90.1 03/22/2012   LDLCALC 105* 02/02/2010   ALT 19 03/22/2012   AST 20 03/22/2012   NA 140 03/22/2012   K 4.2 03/22/2012   CL 103 03/22/2012   CREATININE 0.7 03/22/2012   BUN 16 03/22/2012  CO2 30 03/22/2012   TSH 1.26 03/22/2012         Assessment & Plan:

## 2013-04-04 NOTE — Assessment & Plan Note (Signed)
Continue with current prescription therapy as reflected on the Med list.  

## 2013-04-04 NOTE — Progress Notes (Signed)
Pre visit review using our clinic review tool, if applicable. No additional management support is needed unless otherwise documented below in the visit note.,h  

## 2013-04-04 NOTE — Progress Notes (Signed)
No allergies to eggs or soy. No problems with anesthesia.  

## 2013-04-04 NOTE — Assessment & Plan Note (Signed)
We discussed age appropriate health related issues, including available/recomended screening tests and vaccinations. We discussed a need for adhering to healthy diet and exercise. Labs/EKG were reviewed/ordered. All questions were answered. Colon is pending

## 2013-04-05 LAB — HEPATIC FUNCTION PANEL
ALBUMIN: 4.2 g/dL (ref 3.5–5.2)
ALK PHOS: 104 U/L (ref 39–117)
ALT: 19 U/L (ref 0–35)
AST: 20 U/L (ref 0–37)
Bilirubin, Direct: 0.1 mg/dL (ref 0.0–0.3)
TOTAL PROTEIN: 7.5 g/dL (ref 6.0–8.3)
Total Bilirubin: 0.9 mg/dL (ref 0.3–1.2)

## 2013-04-05 LAB — TSH: TSH: 2.54 u[IU]/mL (ref 0.35–5.50)

## 2013-04-05 LAB — BASIC METABOLIC PANEL
BUN: 12 mg/dL (ref 6–23)
CALCIUM: 9.4 mg/dL (ref 8.4–10.5)
CHLORIDE: 99 meq/L (ref 96–112)
CO2: 28 mEq/L (ref 19–32)
Creatinine, Ser: 0.7 mg/dL (ref 0.4–1.2)
GFR: 110.88 mL/min (ref >60.00)
Glucose, Bld: 91 mg/dL (ref 70–99)
Potassium: 4.1 mEq/L (ref 3.5–5.1)
SODIUM: 137 meq/L (ref 135–145)

## 2013-04-10 ENCOUNTER — Telehealth: Payer: Self-pay | Admitting: *Deleted

## 2013-04-10 NOTE — Telephone Encounter (Signed)
Patient stated she was returning Stacey's phone call regarding her labs.

## 2013-04-17 ENCOUNTER — Encounter: Payer: Self-pay | Admitting: Gastroenterology

## 2013-04-17 ENCOUNTER — Encounter: Payer: Federal, State, Local not specified - PPO | Admitting: Gastroenterology

## 2013-04-18 NOTE — Telephone Encounter (Signed)
Left mess for patient to call back.  

## 2013-04-18 NOTE — Telephone Encounter (Signed)
Spoke with pt she states she has already been given results by Erline Levine.

## 2013-04-28 ENCOUNTER — Encounter: Payer: Self-pay | Admitting: Internal Medicine

## 2013-06-21 ENCOUNTER — Ambulatory Visit (AMBULATORY_SURGERY_CENTER): Payer: Federal, State, Local not specified - PPO | Admitting: Gastroenterology

## 2013-06-21 ENCOUNTER — Encounter: Payer: Self-pay | Admitting: Gastroenterology

## 2013-06-21 VITALS — BP 138/78 | HR 73 | Temp 98.1°F | Resp 23 | Ht 64.0 in | Wt 139.0 lb

## 2013-06-21 DIAGNOSIS — Z86010 Personal history of colonic polyps: Secondary | ICD-10-CM

## 2013-06-21 MED ORDER — SODIUM CHLORIDE 0.9 % IV SOLN: 500.0000 mL | INTRAVENOUS | Status: DC

## 2013-06-21 NOTE — Progress Notes (Signed)
Report to pacu rn, vss, bbs=clear 

## 2013-06-21 NOTE — Op Note (Signed)
[facility]  [address] [place], [id number]   COLONOSCOPY PROCEDURE REPORT  PATIENT: Julie, Murray  MR#: [id number] BIRTHDATE: Feb 28, 1951 , 11  yrs. old GENDER: Female ENDOSCOPIST: Ladene Artist, MD, [facility] PROCEDURE DATE:  06/21/2013 PROCEDURE:   Colonoscopy, surveillance First Screening Colonoscopy - Avg.  risk and is 50 yrs.  old or older - No.  Prior Negative Screening - Now for repeat screening. N/A  History of Adenoma - Now for follow-up colonoscopy & has been > or = to 3 yrs.  Yes hx of adenoma.  Has been 3 or more years since last colonoscopy.  Polyps Removed Today? No.  Recommend repeat exam, <10 yrs? Yes.  High risk (family or personal hx). ASA CLASS:   Class II INDICATIONS:Patient's personal history of adenomatous colon polyps.  MEDICATIONS: MAC sedation, administered by CRNA and propofol (Diprivan) 250mg  IV DESCRIPTION OF PROCEDURE:   After the risks benefits and alternatives of the procedure were thoroughly explained, informed consent was obtained.  A digital rectal exam revealed no abnormalities of the rectum.   The LB [id number]  endoscope was introduced through the anus and advanced to the cecum, which was identified by both the appendix and ileocecal valve. No adverse events experienced.   The quality of the prep was excellent, using MoviPrep  The instrument was then slowly withdrawn as the colon was fully examined.  COLON FINDINGS: Mild diverticulosis was noted in the sigmoid colon. The colon was otherwise normal.  There was no diverticulosis, inflammation, polyps or cancers unless previously stated. Retroflexed views revealed no abnormalities. The time to cecum=3 minutes 12 seconds.  Withdrawal time=9 minutes 07 seconds.  The scope was withdrawn and the procedure completed.  COMPLICATIONS: There were no complications.  ENDOSCOPIC IMPRESSION: 1.   Mild diverticulosis was noted in the sigmoid colon 2.   The colon was otherwise  normal  RECOMMENDATIONS: 1.  High fiber diet with liberal fluid intake. 2.  Repeat Colonoscopy in 5 years.  eSigned:  Ladene Artist, MD, [facility] 06/21/2013 9:46 AM

## 2013-06-21 NOTE — Patient Instructions (Signed)
YOU HAD AN ENDOSCOPIC PROCEDURE TODAY AT [facility]: Refer to the procedure report that was given to you for any specific questions about what was found during the examination.  If the procedure report does not answer your questions, please call your gastroenterologist to clarify.  If you requested that your care partner not be given the details of your procedure findings, then the procedure report has been included in a sealed envelope for you to review at your convenience later.  YOU SHOULD EXPECT: Some feelings of bloating in the abdomen. Passage of more gas than usual.  Walking can help get rid of the air that was put into your GI tract during the procedure and reduce the bloating. If you had a lower endoscopy (such as a colonoscopy or flexible sigmoidoscopy) you may notice spotting of blood in your stool or on the toilet paper. If you underwent a bowel prep for your procedure, then you may not have a normal bowel movement for a few days.  DIET: Your first meal following the procedure should be a light meal and then it is ok to progress to your normal diet.  A half-sandwich or bowl of soup is an example of a good first meal.  Heavy or fried foods are harder to digest and may make you feel nauseous or bloated.  Likewise meals heavy in dairy and vegetables can cause extra gas to form and this can also increase the bloating.  Drink plenty of fluids but you should avoid alcoholic beverages for 24 hours.  ACTIVITY: Your care partner should take you home directly after the procedure.  You should plan to take it easy, moving slowly for the rest of the day.  You can resume normal activity the day after the procedure however you should NOT DRIVE or use heavy machinery for 24 hours (because of the sedation medicines used during the test).    SYMPTOMS TO REPORT IMMEDIATELY: A gastroenterologist can be reached at any hour.  During normal business hours, 8:30 AM to 5:00 PM Monday through Friday,  call [phone].  After hours and on weekends, please call the GI answering service at [phone] who will take a message and have the physician on call contact you.   Following lower endoscopy (colonoscopy or flexible sigmoidoscopy):  Excessive amounts of blood in the stool  Significant tenderness or worsening of abdominal pains  Swelling of the abdomen that is new, acute  Fever of 100F or higher   FOLLOW UP:  Our staff will call the home number listed on your records the next business day following your procedure to check on you and address any questions or concerns that you may have at that time regarding the information given to you following your procedure. This is a courtesy call and so if there is no answer at the home number and we have not heard from you through the emergency physician on call, we will assume that you have returned to your regular daily activities without incident.  SIGNATURES/CONFIDENTIALITY: You and/or your care partner have signed paperwork which will be entered into your electronic medical record.  These signatures attest to the fact that that the information above on your After Visit Summary has been reviewed and is understood.  Full responsibility of the confidentiality of this discharge information lies with you and/or your care-partner.  Please read over handouts about diverticulosis and high fiber diets  Next colonoscopy-5 years  Continue your normal medications

## 2013-06-21 NOTE — Progress Notes (Signed)
Patient denies any allergies to eggs or soy. Patient denies any problems with anesthesia. No diet/wt loss pills. No oxygen use at home per patient.

## 2013-06-22 ENCOUNTER — Telehealth: Payer: Self-pay | Admitting: *Deleted

## 2013-06-22 NOTE — Telephone Encounter (Signed)
Name identifier, left message, follow-up 

## 2014-01-10 ENCOUNTER — Other Ambulatory Visit: Payer: Self-pay | Admitting: Obstetrics and Gynecology

## 2014-01-11 LAB — CYTOLOGY - PAP

## 2014-01-22 IMAGING — CR DG HAND COMPLETE 3+V*R*
3 series · 3 of 3 positions shown · non-contrast
Comparison: None.

CLINICAL DATA: Pain, swelling

RIGHT HAND - COMPLETE 3+ VIEW

[view not recorded (1 of 3)]
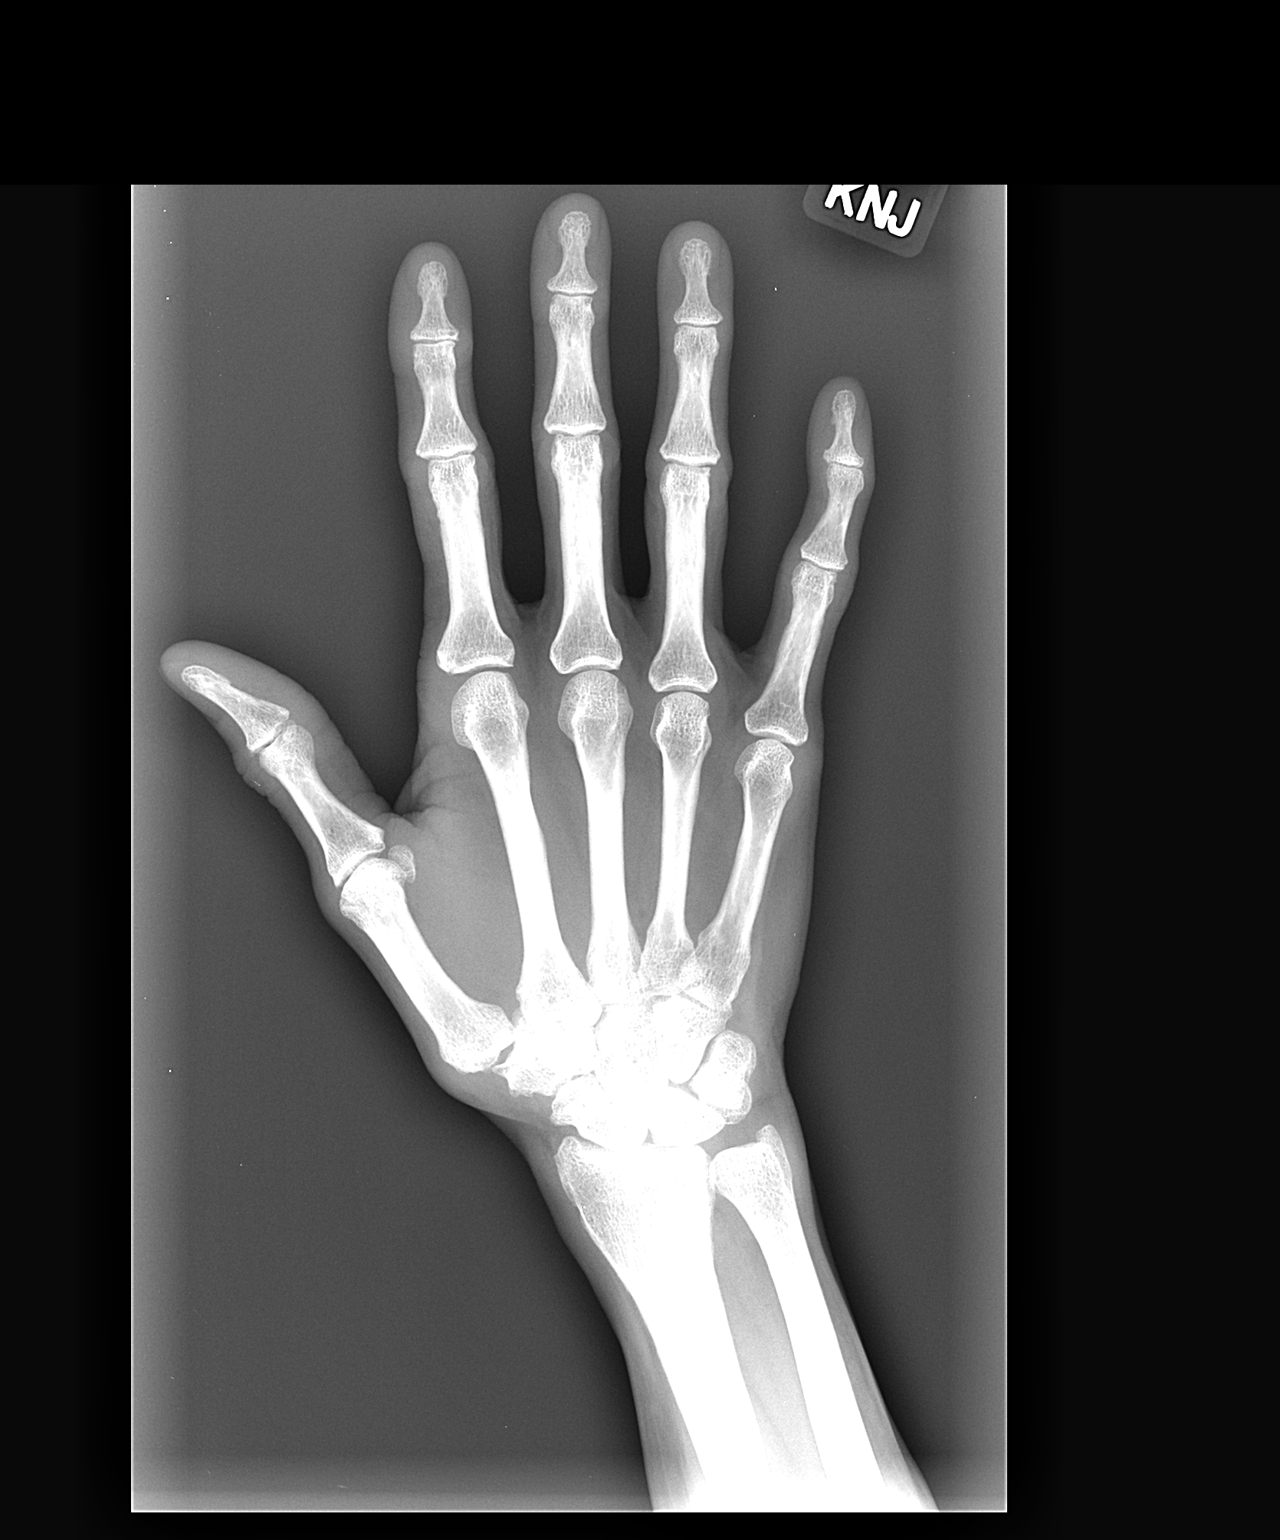

[view not recorded (2 of 3)]
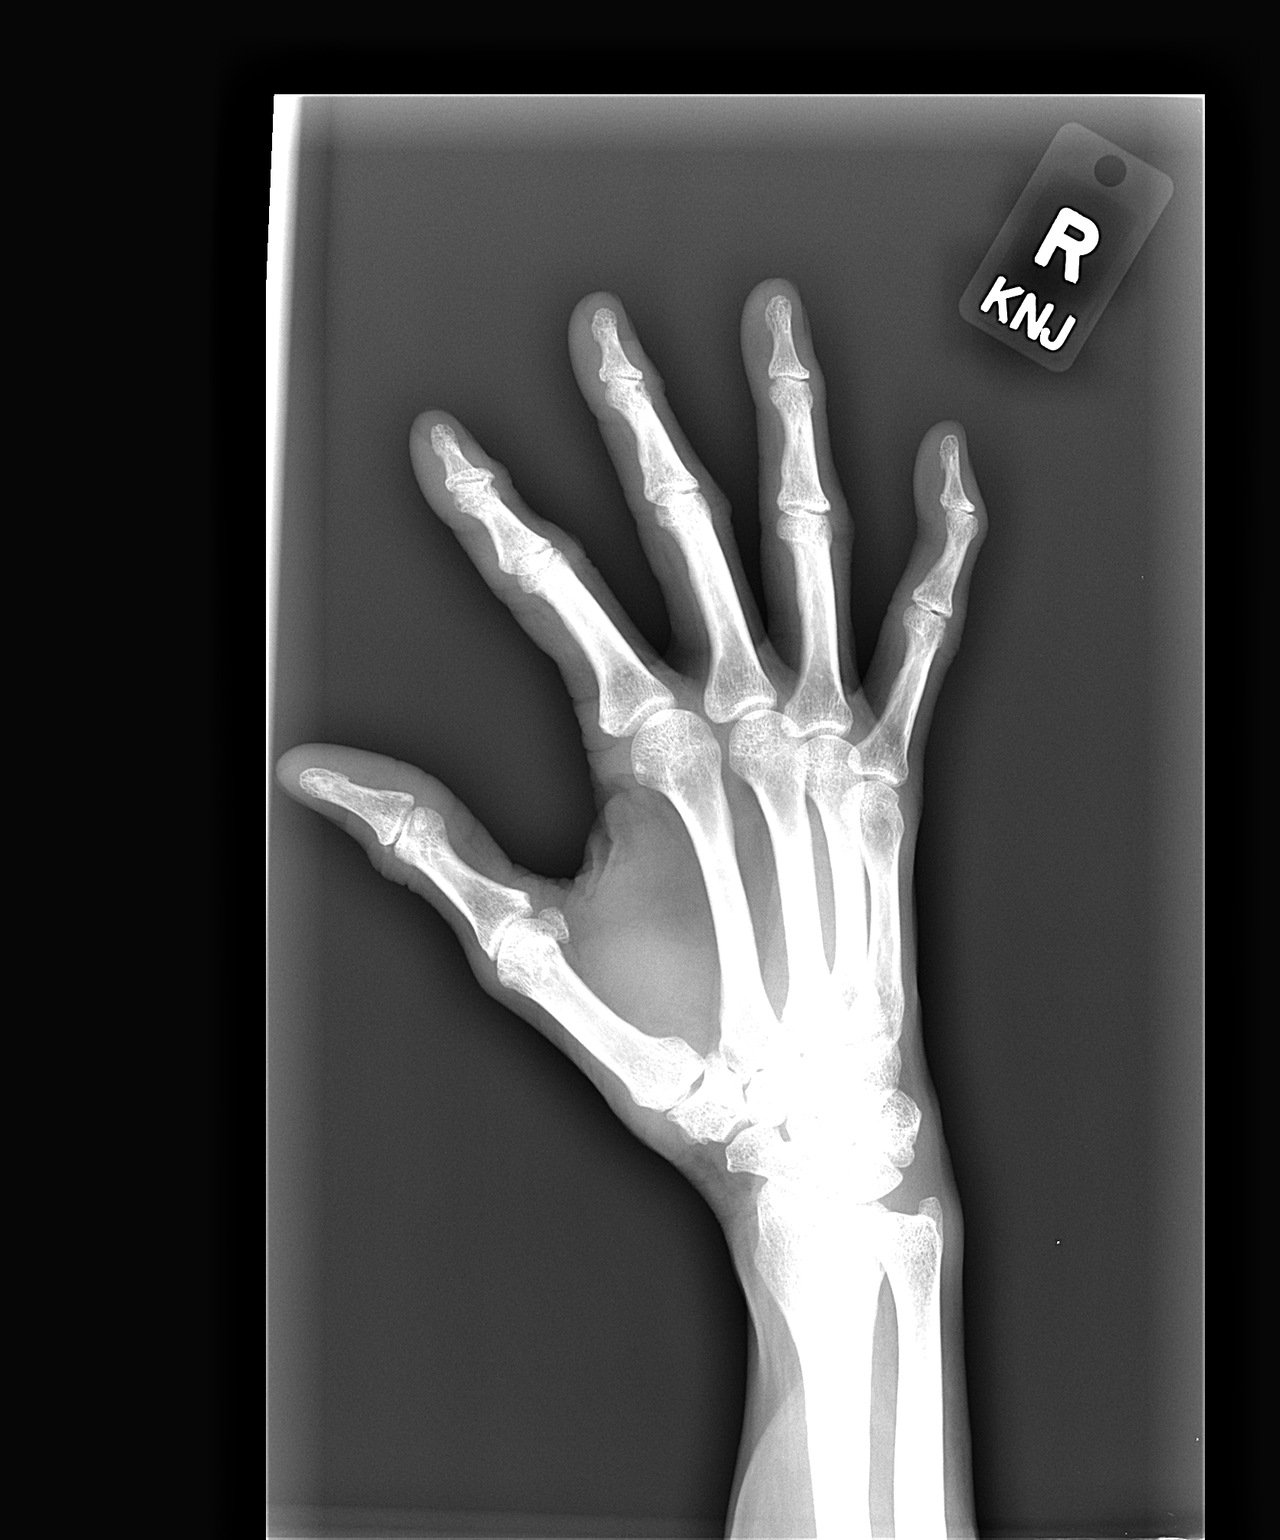

[view not recorded (3 of 3)]
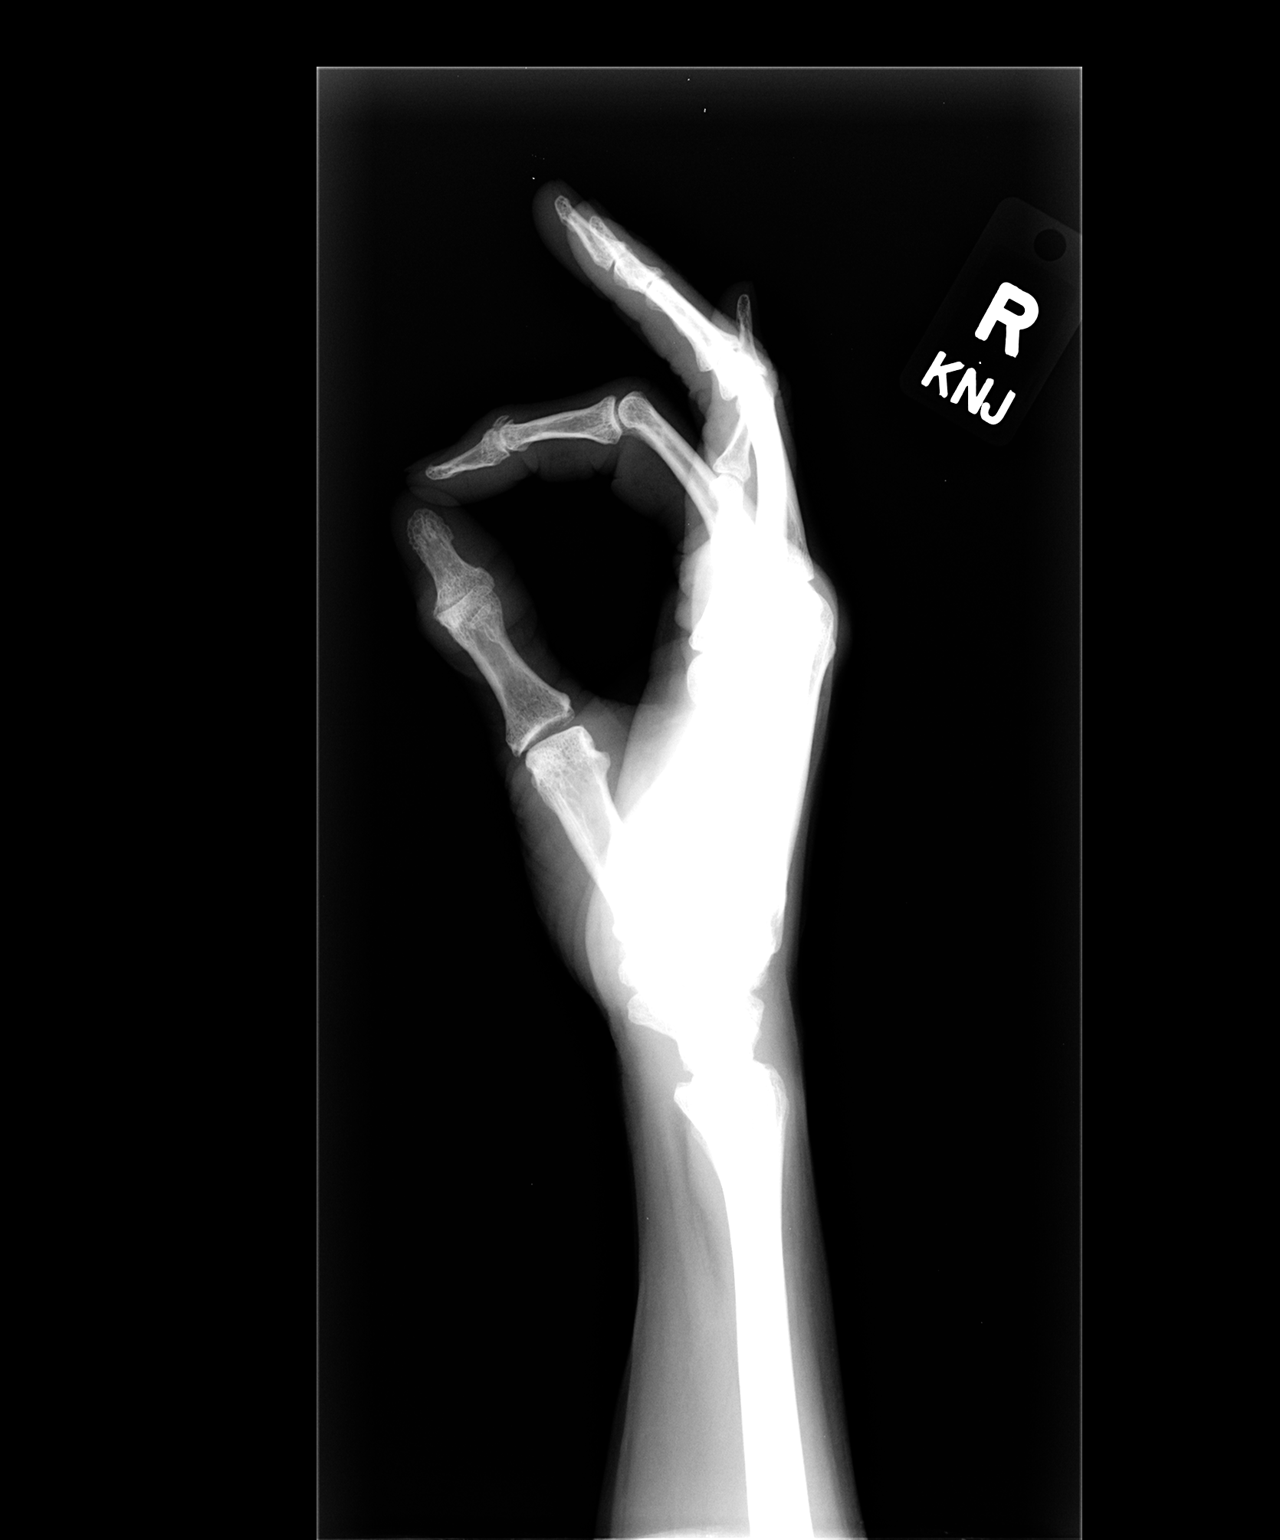

[3 of 3 positions shown; findings below may reference images not displayed]

FINDINGS: Three views of the right hand submitted.  No acute
fracture or subluxation.  Mild degenerative changes distal
interphalangeal joints second finger.
IMPRESSION: No acute fracture or subluxation.  Mild degenerative changes distal
interphalangeal joint second finger.

## 2014-03-19 ENCOUNTER — Telehealth: Payer: Self-pay | Admitting: Internal Medicine

## 2014-03-19 NOTE — Telephone Encounter (Signed)
Pt called and wanted to know if Dr Camila Li could call in enough pills to make it till her 2/5 CPE appt.  She dont want to come in twice and pay 2 different copays.  Explain to her she would have to come to the cpe appt to get any further refills .

## 2014-03-19 NOTE — Telephone Encounter (Signed)
I'm sorry Edwena Blow, what med was it?

## 2014-03-20 NOTE — Telephone Encounter (Signed)
It is the amLODipine (NORVASC) 2.5 MG tablet [[id number].   Sorry Marzetta Board!!

## 2014-03-21 ENCOUNTER — Other Ambulatory Visit: Payer: Self-pay | Admitting: Internal Medicine

## 2014-03-21 MED ORDER — AMLODIPINE BESYLATE 2.5 MG PO TABS: 2.5000 mg | ORAL_TABLET | Freq: Every day | ORAL | Status: DC

## 2014-03-21 NOTE — Telephone Encounter (Signed)
Called pt no answer LMOM will senf 30 day into [employer] until appt...[name]

## 2014-03-25 ENCOUNTER — Ambulatory Visit: Payer: Federal, State, Local not specified - PPO | Admitting: Internal Medicine

## 2014-04-05 ENCOUNTER — Ambulatory Visit (INDEPENDENT_AMBULATORY_CARE_PROVIDER_SITE_OTHER): Payer: Federal, State, Local not specified - PPO | Admitting: Internal Medicine

## 2014-04-05 ENCOUNTER — Other Ambulatory Visit (INDEPENDENT_AMBULATORY_CARE_PROVIDER_SITE_OTHER): Payer: Federal, State, Local not specified - PPO

## 2014-04-05 ENCOUNTER — Encounter: Payer: Self-pay | Admitting: Internal Medicine

## 2014-04-05 VITALS — BP 148/100 | HR 71 | Temp 98.3°F | Ht 64.0 in | Wt 143.0 lb

## 2014-04-05 DIAGNOSIS — Z0000 Encounter for general adult medical examination without abnormal findings: Secondary | ICD-10-CM

## 2014-04-05 DIAGNOSIS — M25559 Pain in unspecified hip: Secondary | ICD-10-CM

## 2014-04-05 DIAGNOSIS — M25569 Pain in unspecified knee: Secondary | ICD-10-CM

## 2014-04-05 LAB — CBC WITH DIFFERENTIAL/PLATELET
BASOS ABS: 0.1 10*3/uL (ref 0.0–0.1)
Basophils Relative: 1.1 % (ref 0.0–3.0)
EOS ABS: 0.3 10*3/uL (ref 0.0–0.7)
Eosinophils Relative: 4 % (ref 0.0–5.0)
HEMATOCRIT: 38.1 % (ref 36.0–46.0)
Hemoglobin: 12.6 g/dL (ref 12.0–15.0)
Lymphocytes Relative: 25.5 % (ref 12.0–46.0)
Lymphs Abs: 1.9 10*3/uL (ref 0.7–4.0)
MCHC: 33 g/dL (ref 30.0–36.0)
MCV: 82.2 fl (ref 78.0–100.0)
MONO ABS: 0.5 10*3/uL (ref 0.1–1.0)
Monocytes Relative: 7.1 % (ref 3.0–12.0)
NEUTROS ABS: 4.6 10*3/uL (ref 1.4–7.7)
Neutrophils Relative %: 62.3 % (ref 43.0–77.0)
PLATELETS: 283 10*3/uL (ref 150.0–400.0)
RBC: 4.64 Mil/uL (ref 3.87–5.11)
RDW: 13.9 % (ref 11.5–15.5)
WBC: 7.4 10*3/uL (ref 4.0–10.5)

## 2014-04-05 LAB — URINALYSIS
Bilirubin Urine: NEGATIVE
Leukocytes, UA: NEGATIVE
Nitrite: NEGATIVE
SPECIFIC GRAVITY, URINE: 1.025 (ref 1.000–1.030)
Total Protein, Urine: NEGATIVE
URINE GLUCOSE: NEGATIVE
UROBILINOGEN UA: 0.2 (ref 0.0–1.0)
pH: 5.5 (ref 5.0–8.0)

## 2014-04-05 LAB — HEPATIC FUNCTION PANEL
ALT: 13 U/L (ref 0–35)
AST: 16 U/L (ref 0–37)
Albumin: 4 g/dL (ref 3.5–5.2)
Alkaline Phosphatase: 111 U/L (ref 39–117)
BILIRUBIN DIRECT: 0.1 mg/dL (ref 0.0–0.3)
BILIRUBIN TOTAL: 0.5 mg/dL (ref 0.2–1.2)
Total Protein: 7 g/dL (ref 6.0–8.3)

## 2014-04-05 LAB — BASIC METABOLIC PANEL
BUN: 12 mg/dL (ref 6–23)
CO2: 29 mEq/L (ref 19–32)
Calcium: 9.5 mg/dL (ref 8.4–10.5)
Chloride: 103 mEq/L (ref 96–112)
Creatinine, Ser: 0.72 mg/dL (ref 0.40–1.20)
GFR: 105.23 mL/min (ref >60.00)
GLUCOSE: 90 mg/dL (ref 70–99)
Potassium: 3.9 mEq/L (ref 3.5–5.1)
SODIUM: 138 meq/L (ref 135–145)

## 2014-04-05 LAB — LIPID PANEL
CHOLESTEROL: 203 mg/dL — AB (ref 0–200)
HDL: 78.3 mg/dL (ref >39.00)
LDL Cholesterol: 110 mg/dL — ABNORMAL HIGH (ref 0–99)
NonHDL: 124.7
Total CHOL/HDL Ratio: 3
Triglycerides: 72 mg/dL (ref 0.0–149.0)
VLDL: 14.4 mg/dL (ref 0.0–40.0)

## 2014-04-05 LAB — TSH: TSH: 1.6 u[IU]/mL (ref 0.35–4.50)

## 2014-04-05 MED ORDER — MELOXICAM 15 MG PO TABS: 15.0000 mg | ORAL_TABLET | Freq: Every day | ORAL | Status: DC | PRN

## 2014-04-05 MED ORDER — AMLODIPINE BESYLATE 2.5 MG PO TABS: 2.5000 mg | ORAL_TABLET | Freq: Every day | ORAL | Status: DC

## 2014-04-05 MED ORDER — VITAMIN D 1000 UNITS PO TABS: 1000.0000 [IU] | ORAL_TABLET | Freq: Every day | ORAL | Status: DC

## 2014-04-05 MED ORDER — METHOCARBAMOL 500 MG PO TABS: 500.0000 mg | ORAL_TABLET | Freq: Three times a day (TID) | ORAL | Status: DC | PRN

## 2014-04-05 NOTE — Assessment & Plan Note (Signed)
We discussed age appropriate health related issues, including available/recomended screening tests and vaccinations. We discussed a need for adhering to healthy diet and exercise. Labs/EKG were reviewed/ordered. All questions were answered.   

## 2014-04-05 NOTE — Assessment & Plan Note (Signed)
MSK chronic B IT band pain  IT band stretch Meloxicam prn

## 2014-04-05 NOTE — Assessment & Plan Note (Signed)
2/16 B IT band pain Meloxicam prn Muscle relaxant prn Stretching

## 2014-04-05 NOTE — Progress Notes (Signed)
Subjective:    HPI  The patient is here for a wellness exam. The patient has been doing well overall without major physical or psychological issues going on lately. C/o B hip pain and stiffness x years;bad pain at times... C/o fatigue BP is nl at home  Wt Readings from Last 3 Encounters:  04/05/14 143 lb (64.864 kg)  06/21/13 139 lb (63.05 kg)  04/04/13 139 lb (63.05 kg)   BP Readings from Last 3 Encounters:  04/05/14 148/100  06/21/13 138/78  04/04/13 150/98     Review of Systems  Constitutional: Negative.  Negative for fever, chills, diaphoresis, activity change, appetite change, fatigue and unexpected weight change.  HENT: Negative for congestion, ear pain, facial swelling, hearing loss, mouth sores, nosebleeds, postnasal drip, rhinorrhea, sinus pressure, sneezing, sore throat, tinnitus and trouble swallowing.   Eyes: Negative for pain, discharge, redness, itching and visual disturbance.  Respiratory: Negative for cough, chest tightness, shortness of breath, wheezing and stridor.   Cardiovascular: Negative for chest pain, palpitations and leg swelling.  Gastrointestinal: Negative for nausea, diarrhea, constipation, blood in stool, abdominal distention, anal bleeding and rectal pain.  Genitourinary: Negative for dysuria, urgency, frequency, hematuria, flank pain, vaginal bleeding, vaginal discharge, difficulty urinating, genital sores and pelvic pain.  Musculoskeletal: Positive for arthralgias (hips). Negative for back pain, joint swelling, gait problem, neck pain and neck stiffness.  Skin: Negative.  Negative for rash.  Neurological: Negative for dizziness, tremors, seizures, syncope, speech difficulty, weakness, numbness and headaches.  Hematological: Negative for adenopathy. Does not bruise/bleed easily.  Psychiatric/Behavioral: Negative for suicidal ideas, behavioral problems, sleep disturbance, dysphoric mood and decreased concentration. The patient is not nervous/anxious.         Objective:   Physical Exam  Constitutional: She appears well-developed. No distress.  HENT:  Head: Normocephalic.  Right Ear: External ear normal.  Left Ear: External ear normal.  Nose: Nose normal.  Mouth/Throat: Oropharynx is clear and moist.  Eyes: Conjunctivae are normal. Pupils are equal, round, and reactive to light. Right eye exhibits no discharge. Left eye exhibits no discharge.  Neck: Normal range of motion. Neck supple. No JVD present. No tracheal deviation present. No thyromegaly present.  Cardiovascular: Normal rate, regular rhythm and normal heart sounds.   Pulmonary/Chest: No stridor. No respiratory distress. She has no wheezes.  Abdominal: Soft. Bowel sounds are normal. She exhibits no distension and no mass. There is no tenderness. There is no rebound and no guarding.  Musculoskeletal: She exhibits tenderness (B hips are stiff and tender in the groin and in troch bursa areas). She exhibits no edema.  LS is ok  Lymphadenopathy:    She has no cervical adenopathy.  Neurological: She displays normal reflexes. No cranial nerve deficit. She exhibits normal muscle tone. Coordination normal.  Skin: No rash noted. No erythema.  Psychiatric: She has a normal mood and affect. Her behavior is normal. Judgment and thought content normal.  IT bands are tender B  Lab Results  Component Value Date   WBC 7.5 04/04/2013   HGB 12.9 04/04/2013   HCT 40.0 04/04/2013   PLT 320.0 04/04/2013   GLUCOSE 91 04/04/2013   CHOL 201* 03/22/2012   TRIG 46.0 03/22/2012   HDL 88.50 03/22/2012   LDLDIRECT 90.1 03/22/2012   LDLCALC 105* 02/02/2010   ALT 19 04/04/2013   AST 20 04/04/2013   NA 137 04/04/2013   K 4.1 04/04/2013   CL 99 04/04/2013   CREATININE 0.7 04/04/2013   BUN 12 04/04/2013  CO2 28 04/04/2013   TSH 2.54 04/04/2013         Assessment & Plan:  Patient ID: [name], female   DOB: Jun 30, 1950, 64 y.o.   MRN: [id number]

## 2014-04-05 NOTE — Patient Instructions (Signed)
[email or web address]: IT band stretches

## 2014-04-05 NOTE — Progress Notes (Signed)
Pre visit review using our clinic review tool, if applicable. No additional management support is needed unless otherwise documented below in the visit note. 

## 2014-07-10 ENCOUNTER — Other Ambulatory Visit: Payer: Self-pay | Admitting: Internal Medicine

## 2014-07-12 ENCOUNTER — Encounter: Payer: Self-pay | Admitting: Gastroenterology

## 2014-12-31 LAB — HM MAMMOGRAPHY

## 2015-03-18 ENCOUNTER — Ambulatory Visit (INDEPENDENT_AMBULATORY_CARE_PROVIDER_SITE_OTHER): Payer: Federal, State, Local not specified - PPO | Admitting: Internal Medicine

## 2015-03-18 ENCOUNTER — Encounter: Payer: Self-pay | Admitting: Internal Medicine

## 2015-03-18 VITALS — BP 148/100 | HR 85 | Wt 139.0 lb

## 2015-03-18 DIAGNOSIS — S7012XA Contusion of left thigh, initial encounter: Secondary | ICD-10-CM | POA: Diagnosis not present

## 2015-03-18 MED ORDER — IBUPROFEN 600 MG PO TABS: ORAL_TABLET | ORAL | Status: DC

## 2015-03-18 NOTE — Progress Notes (Signed)
Subjective:  Patient ID: [name], female    DOB: 09/06/1950  Age: 65 y.o. MRN: [id number]  CC: No chief complaint on file.   HPI [name] presents for a swelling on L thigh. Pt fell off a two-step ladder on 02/12/15. Pt has developed swelling right away. She was bruised.  Now - no pain. No fever. Swelling is no smaller.  Outpatient Prescriptions Prior to Visit  Medication Sig Dispense Refill  . amLODipine (NORVASC) 2.5 MG tablet Take 1 tablet (2.5 mg total) by mouth daily. 90 tablet 3  . cholecalciferol (VITAMIN D) 1000 UNITS tablet Take 1 tablet (1,000 Units total) by mouth daily. 100 tablet 3  . meloxicam (MOBIC) 15 MG tablet TAKE ONE TABLET BY MOUTH DAILY AS NEEDED 90 tablet 3  . methocarbamol (ROBAXIN) 500 MG tablet Take 1 tablet (500 mg total) by mouth every 8 (eight) hours as needed for muscle spasms. 60 tablet 1  . Multiple Vitamin (MULTIVITAMIN) tablet Take 1 tablet by mouth daily.     No facility-administered medications prior to visit.    ROS Review of Systems  Constitutional: Negative for fever, chills, activity change, appetite change, fatigue and unexpected weight change.  HENT: Negative for congestion, mouth sores and sinus pressure.   Eyes: Negative for visual disturbance.  Respiratory: Negative for cough and chest tightness.   Gastrointestinal: Negative for nausea and abdominal pain.  Genitourinary: Negative for frequency, difficulty urinating and vaginal pain.  Musculoskeletal: Negative for back pain and gait problem.  Skin: Negative for pallor and rash.  Neurological: Negative for dizziness, tremors, weakness, numbness and headaches.  Psychiatric/Behavioral: Negative for confusion and sleep disturbance.    Objective:  BP 148/100 mmHg  Pulse 85  Wt 139 lb (63.05 kg)  SpO2 97%  BP Readings from Last 3 Encounters:  03/18/15 148/100  04/05/14 148/100  06/21/13 138/78    Wt Readings from Last 3 Encounters:  03/18/15 139 lb (63.05 kg)    04/05/14 143 lb (64.864 kg)  06/21/13 139 lb (63.05 kg)    Physical Exam  Constitutional: She appears well-developed and well-nourished.  Musculoskeletal: She exhibits no tenderness.   8x10 cm L lat mid-thigh oval mass - NT. No bruise   Procedure Note :    Procedure :   Point of care (POC) sonography examination   Indication: L thigh smelling   Equipment used: Sonosite M-Turbo with HFL38x/13-6 MHz transducer linear probe. The images were stored in the unit and later transferred in storage.  The patient was placed in a sitting position.  This study revealed a hypoechoic  8x10 cm lesion on L lat thigh w/heteroechoic debris   Impression: large L thigh lateral seroma and hematoma, post-traumatic      Lab Results  Component Value Date   WBC 7.4 04/05/2014   HGB 12.6 04/05/2014   HCT 38.1 04/05/2014   PLT 283.0 04/05/2014   GLUCOSE 90 04/05/2014   CHOL 203* 04/05/2014   TRIG 72.0 04/05/2014   HDL 78.30 04/05/2014   LDLDIRECT 90.1 03/22/2012   LDLCALC 110* 04/05/2014   ALT 13 04/05/2014   AST 16 04/05/2014   NA 138 04/05/2014   K 3.9 04/05/2014   CL 103 04/05/2014   CREATININE 0.72 04/05/2014   BUN 12 04/05/2014   CO2 29 04/05/2014   TSH 1.60 04/05/2014    Dg Hand Complete Right  03/29/2012  *RADIOLOGY REPORT* Clinical Data: Pain, swelling RIGHT HAND - COMPLETE 3+ VIEW Comparison: None. Findings: Three views of the right hand  submitted.  No acute fracture or subluxation.  Mild degenerative changes distal interphalangeal joints second finger. IMPRESSION: No acute fracture or subluxation.  Mild degenerative changes distal interphalangeal joint second finger. Original Report Authenticated By: Lahoma Crocker, M.D.    Assessment & Plan:   Diagnoses and all orders for this visit:  Hematoma of left thigh, initial encounter  Other orders -     ibuprofen (ADVIL,MOTRIN) 600 MG tablet; Take twice a day x 2 weeks, then prn pain  I am having Ms. Obst start on ibuprofen.  I am also having her maintain her multivitamin, amLODipine, methocarbamol, cholecalciferol, meloxicam, and oxybutynin.  Meds ordered this encounter  Medications  . oxybutynin (DITROPAN) 5 MG tablet    Sig: Take 1 tablet by mouth daily.  Marland Kitchen ibuprofen (ADVIL,MOTRIN) 600 MG tablet    Sig: Take twice a day x 2 weeks, then prn pain    Dispense:  60 tablet    Refill:  1     Follow-up: Return in about 3 weeks (around 04/08/2015) for a follow-up visit.  Walker Kehr, MD

## 2015-03-18 NOTE — Assessment & Plan Note (Addendum)
Heat US done Ibuprof 600 mg bid pc, ACE wrap RTC 3 weeks if not better for the procedure

## 2015-03-18 NOTE — Progress Notes (Signed)
Pre visit review using our clinic review tool, if applicable. No additional management support is needed unless otherwise documented below in the visit note. 

## 2015-05-23 ENCOUNTER — Encounter: Payer: Self-pay | Admitting: Internal Medicine

## 2015-05-23 ENCOUNTER — Ambulatory Visit (INDEPENDENT_AMBULATORY_CARE_PROVIDER_SITE_OTHER): Payer: Federal, State, Local not specified - PPO | Admitting: Internal Medicine

## 2015-05-23 VITALS — BP 150/90 | HR 86 | Ht 64.0 in | Wt 137.0 lb

## 2015-05-23 DIAGNOSIS — Z0000 Encounter for general adult medical examination without abnormal findings: Secondary | ICD-10-CM | POA: Diagnosis not present

## 2015-05-23 DIAGNOSIS — S7012XS Contusion of left thigh, sequela: Secondary | ICD-10-CM

## 2015-05-23 DIAGNOSIS — I10 Essential (primary) hypertension: Secondary | ICD-10-CM

## 2015-05-23 MED ORDER — MELOXICAM 15 MG PO TABS: 15.0000 mg | ORAL_TABLET | Freq: Every day | ORAL | Status: DC | PRN

## 2015-05-23 MED ORDER — AMLODIPINE BESYLATE 2.5 MG PO TABS: 2.5000 mg | ORAL_TABLET | Freq: Every day | ORAL | Status: DC

## 2015-05-23 NOTE — Assessment & Plan Note (Signed)
We discussed age appropriate health related issues, including available/recomended screening tests and vaccinations. We discussed a need for adhering to healthy diet and exercise. Labs/EKG were reviewed/ordered. All questions were answered.   

## 2015-05-23 NOTE — Progress Notes (Signed)
Pre visit review using our clinic review tool, if applicable. No additional management support is needed unless otherwise documented below in the visit note. 

## 2015-05-23 NOTE — Assessment & Plan Note (Signed)
Better Yoga pants for compression

## 2015-05-23 NOTE — Assessment & Plan Note (Addendum)
BP Readings from Last 3 Encounters:  05/23/15 150/90  03/18/15 148/100  04/05/14 148/100  Elevated BP - Check BP at home - report if elevated please Norvasc

## 2015-05-23 NOTE — Progress Notes (Signed)
Subjective:  Patient ID: [name], female    DOB: 1951-02-02  Age: 65 y.o. MRN: [id number]  CC: Annual Exam   HPI [name] presents for a well exam C/o L thigh swelling   Outpatient Prescriptions Prior to Visit  Medication Sig Dispense Refill  . cholecalciferol (VITAMIN D) 1000 UNITS tablet Take 1 tablet (1,000 Units total) by mouth daily. 100 tablet 3  . methocarbamol (ROBAXIN) 500 MG tablet Take 1 tablet (500 mg total) by mouth every 8 (eight) hours as needed for muscle spasms. 60 tablet 1  . Multiple Vitamin (MULTIVITAMIN) tablet Take 1 tablet by mouth daily.    Marland Kitchen oxybutynin (DITROPAN) 5 MG tablet Take 1 tablet by mouth daily.    Marland Kitchen amLODipine (NORVASC) 2.5 MG tablet Take 1 tablet (2.5 mg total) by mouth daily. 90 tablet 3  . meloxicam (MOBIC) 15 MG tablet TAKE ONE TABLET BY MOUTH DAILY AS NEEDED 90 tablet 3  . ibuprofen (ADVIL,MOTRIN) 600 MG tablet Take twice a day x 2 weeks, then prn pain (Patient not taking: Reported on 05/23/2015) 60 tablet 1   No facility-administered medications prior to visit.    ROS Review of Systems  Constitutional: Negative for chills, activity change, appetite change, fatigue and unexpected weight change.  HENT: Negative for congestion, mouth sores and sinus pressure.   Eyes: Negative for visual disturbance.  Respiratory: Negative for cough and chest tightness.   Gastrointestinal: Negative for nausea and abdominal pain.  Genitourinary: Negative for frequency, difficulty urinating and vaginal pain.  Musculoskeletal: Positive for arthralgias. Negative for back pain and gait problem.  Skin: Negative for pallor and rash.  Neurological: Negative for dizziness, tremors, weakness, numbness and headaches.  Psychiatric/Behavioral: Negative for confusion and sleep disturbance.    Objective:  BP 150/90 mmHg  Pulse 86  Ht 5\' 4"  (1.626 m)  Wt 137 lb (62.143 kg)  BMI 23.50 kg/m2  SpO2 97%  BP Readings from Last 3 Encounters:  05/23/15  150/90  03/18/15 148/100  04/05/14 148/100    Wt Readings from Last 3 Encounters:  05/23/15 137 lb (62.143 kg)  03/18/15 139 lb (63.05 kg)  04/05/14 143 lb (64.864 kg)    Physical Exam  Constitutional: She appears well-developed. No distress.  HENT:  Head: Normocephalic.  Right Ear: External ear normal.  Left Ear: External ear normal.  Nose: Nose normal.  Mouth/Throat: Oropharynx is clear and moist.  Eyes: Conjunctivae are normal. Pupils are equal, round, and reactive to light. Right eye exhibits no discharge. Left eye exhibits no discharge.  Neck: Normal range of motion. Neck supple. No JVD present. No tracheal deviation present. No thyromegaly present.  Cardiovascular: Normal rate, regular rhythm and normal heart sounds.   Pulmonary/Chest: No stridor. No respiratory distress. She has no wheezes.  Abdominal: Soft. Bowel sounds are normal. She exhibits no distension and no mass. There is no tenderness. There is no rebound and no guarding.  Musculoskeletal: She exhibits edema. She exhibits no tenderness.  Lymphadenopathy:    She has no cervical adenopathy.  Neurological: She displays normal reflexes. No cranial nerve deficit. She exhibits normal muscle tone. Coordination normal.  Skin: No rash noted. No erythema.  Psychiatric: She has a normal mood and affect. Her behavior is normal. Judgment and thought content normal.  L lat thigh swelling  Lab Results  Component Value Date   WBC 7.4 04/05/2014   HGB 12.6 04/05/2014   HCT 38.1 04/05/2014   PLT 283.0 04/05/2014   GLUCOSE 90 04/05/2014  CHOL 203* 04/05/2014   TRIG 72.0 04/05/2014   HDL 78.30 04/05/2014   LDLDIRECT 90.1 03/22/2012   LDLCALC 110* 04/05/2014   ALT 13 04/05/2014   AST 16 04/05/2014   NA 138 04/05/2014   K 3.9 04/05/2014   CL 103 04/05/2014   CREATININE 0.72 04/05/2014   BUN 12 04/05/2014   CO2 29 04/05/2014   TSH 1.60 04/05/2014    Dg Hand Complete Right  03/29/2012  *RADIOLOGY REPORT* Clinical  Data: Pain, swelling RIGHT HAND - COMPLETE 3+ VIEW Comparison: None. Findings: Three views of the right hand submitted.  No acute fracture or subluxation.  Mild degenerative changes distal interphalangeal joints second finger. IMPRESSION: No acute fracture or subluxation.  Mild degenerative changes distal interphalangeal joint second finger. Original Report Authenticated By: Lahoma Crocker, M.D.    Assessment & Plan:   [name] was seen today for annual exam.  Diagnoses and all orders for this visit:  Well adult exam -     Hepatitis C antibody; Future -     Basic metabolic panel; Future -     CBC with Differential/Platelet; Future -     Hepatic function panel; Future -     Lipid panel; Future -     TSH; Future -     Urinalysis; Future  Essential hypertension  Hematoma of left thigh, sequela  Other orders -     amLODipine (NORVASC) 2.5 MG tablet; Take 1 tablet (2.5 mg total) by mouth daily. -     meloxicam (MOBIC) 15 MG tablet; Take 1 tablet (15 mg total) by mouth daily as needed.  I have changed [name]'s meloxicam. I am also having her maintain her multivitamin, methocarbamol, cholecalciferol, oxybutynin, ibuprofen, and amLODipine.  Meds ordered this encounter  Medications  . amLODipine (NORVASC) 2.5 MG tablet    Sig: Take 1 tablet (2.5 mg total) by mouth daily.    Dispense:  90 tablet    Refill:  3  . meloxicam (MOBIC) 15 MG tablet    Sig: Take 1 tablet (15 mg total) by mouth daily as needed.    Dispense:  90 tablet    Refill:  1     Follow-up: Return in about 6 months (around 11/23/2015) for a follow-up visit.  Walker Kehr, MD

## 2015-05-23 NOTE — Patient Instructions (Addendum)
Yoga pants Meloxicam x 2 weeks Check BP at home - report if elevated please

## 2015-05-28 ENCOUNTER — Other Ambulatory Visit (INDEPENDENT_AMBULATORY_CARE_PROVIDER_SITE_OTHER): Payer: Federal, State, Local not specified - PPO

## 2015-05-28 DIAGNOSIS — M797 Fibromyalgia: Secondary | ICD-10-CM

## 2015-05-28 DIAGNOSIS — I10 Essential (primary) hypertension: Secondary | ICD-10-CM

## 2015-05-28 DIAGNOSIS — M79643 Pain in unspecified hand: Secondary | ICD-10-CM

## 2015-05-28 DIAGNOSIS — Z0000 Encounter for general adult medical examination without abnormal findings: Secondary | ICD-10-CM | POA: Diagnosis not present

## 2015-05-28 LAB — LIPID PANEL
CHOL/HDL RATIO: 2
Cholesterol: 200 mg/dL (ref 0–200)
HDL: 82.7 mg/dL (ref >39.00)
LDL Cholesterol: 104 mg/dL — ABNORMAL HIGH (ref 0–99)
NONHDL: 117.06
Triglycerides: 67 mg/dL (ref 0.0–149.0)
VLDL: 13.4 mg/dL (ref 0.0–40.0)

## 2015-05-28 LAB — HEPATITIS C ANTIBODY: HCV Ab: NEGATIVE

## 2015-05-28 LAB — CBC WITH DIFFERENTIAL/PLATELET
BASOS ABS: 0 10*3/uL (ref 0.0–0.1)
BASOS PCT: 0.7 % (ref 0.0–3.0)
EOS ABS: 0.3 10*3/uL (ref 0.0–0.7)
Eosinophils Relative: 4.2 % (ref 0.0–5.0)
HCT: 36.8 % (ref 36.0–46.0)
Hemoglobin: 12.2 g/dL (ref 12.0–15.0)
LYMPHS ABS: 2.1 10*3/uL (ref 0.7–4.0)
Lymphocytes Relative: 33 % (ref 12.0–46.0)
MCHC: 33.1 g/dL (ref 30.0–36.0)
MCV: 82.9 fl (ref 78.0–100.0)
Monocytes Absolute: 0.5 10*3/uL (ref 0.1–1.0)
Monocytes Relative: 8.1 % (ref 3.0–12.0)
NEUTROS ABS: 3.4 10*3/uL (ref 1.4–7.7)
NEUTROS PCT: 54 % (ref 43.0–77.0)
PLATELETS: 309 10*3/uL (ref 150.0–400.0)
RBC: 4.44 Mil/uL (ref 3.87–5.11)
RDW: 14.1 % (ref 11.5–15.5)
WBC: 6.3 10*3/uL (ref 4.0–10.5)

## 2015-05-28 LAB — BASIC METABOLIC PANEL
BUN: 15 mg/dL (ref 6–23)
CALCIUM: 9.6 mg/dL (ref 8.4–10.5)
CO2: 31 meq/L (ref 19–32)
CREATININE: 0.8 mg/dL (ref 0.40–1.20)
Chloride: 104 mEq/L (ref 96–112)
GFR: 92.84 mL/min (ref >60.00)
GLUCOSE: 92 mg/dL (ref 70–99)
Potassium: 4.2 mEq/L (ref 3.5–5.1)
Sodium: 141 mEq/L (ref 135–145)

## 2015-05-28 LAB — URINALYSIS
Bilirubin Urine: NEGATIVE
KETONES UR: NEGATIVE
LEUKOCYTES UA: NEGATIVE
Nitrite: NEGATIVE
PH: 5.5 (ref 5.0–8.0)
SPECIFIC GRAVITY, URINE: 1.02 (ref 1.000–1.030)
Total Protein, Urine: NEGATIVE
URINE GLUCOSE: NEGATIVE
Urobilinogen, UA: 0.2 (ref 0.0–1.0)

## 2015-05-28 LAB — HEPATIC FUNCTION PANEL
ALK PHOS: 109 U/L (ref 39–117)
ALT: 13 U/L (ref 0–35)
AST: 17 U/L (ref 0–37)
Albumin: 4.2 g/dL (ref 3.5–5.2)
BILIRUBIN DIRECT: 0.1 mg/dL (ref 0.0–0.3)
Total Bilirubin: 0.5 mg/dL (ref 0.2–1.2)
Total Protein: 7.1 g/dL (ref 6.0–8.3)

## 2015-05-28 LAB — TSH: TSH: 1.73 u[IU]/mL (ref 0.35–4.50)

## 2015-05-29 LAB — VITAMIN D 25 HYDROXY (VIT D DEFICIENCY, FRACTURES): Vit D, 25-Hydroxy: 55 ng/mL (ref 30–100)

## 2015-06-07 ENCOUNTER — Other Ambulatory Visit: Payer: Self-pay | Admitting: Internal Medicine

## 2015-06-09 NOTE — Telephone Encounter (Signed)
erx sent on 05/23/15 - confirmation from pharmacy received. Denying refill.

## 2016-03-10 ENCOUNTER — Other Ambulatory Visit: Payer: Self-pay | Admitting: Internal Medicine

## 2016-04-08 ENCOUNTER — Other Ambulatory Visit: Payer: Self-pay | Admitting: Internal Medicine

## 2016-06-02 ENCOUNTER — Encounter: Payer: Self-pay | Admitting: Internal Medicine

## 2016-06-02 ENCOUNTER — Ambulatory Visit (INDEPENDENT_AMBULATORY_CARE_PROVIDER_SITE_OTHER): Payer: Federal, State, Local not specified - PPO | Admitting: Internal Medicine

## 2016-06-02 VITALS — BP 136/82 | HR 72 | Temp 97.6°F | Ht 64.0 in | Wt 138.0 lb

## 2016-06-02 DIAGNOSIS — Z0000 Encounter for general adult medical examination without abnormal findings: Secondary | ICD-10-CM | POA: Diagnosis not present

## 2016-06-02 DIAGNOSIS — Z23 Encounter for immunization: Secondary | ICD-10-CM | POA: Diagnosis not present

## 2016-06-02 DIAGNOSIS — I10 Essential (primary) hypertension: Secondary | ICD-10-CM

## 2016-06-02 MED ORDER — PNEUMOCOCCAL 13-VAL CONJ VACC IM SUSP: 0.5000 mL | INTRAMUSCULAR | Status: DC

## 2016-06-02 MED ORDER — PNEUMOCOCCAL 13-VAL CONJ VACC IM SUSP
0.5000 mL | Freq: Once | INTRAMUSCULAR | Status: AC
  Administered 2016-06-02: 0.5 mL via INTRAMUSCULAR

## 2016-06-02 MED ORDER — MELOXICAM 15 MG PO TABS: 15.0000 mg | ORAL_TABLET | Freq: Every day | ORAL | 0 refills | Status: DC | PRN

## 2016-06-02 MED ORDER — AMLODIPINE BESYLATE 2.5 MG PO TABS: 2.5000 mg | ORAL_TABLET | Freq: Every day | ORAL | 3 refills | Status: DC

## 2016-06-02 NOTE — Assessment & Plan Note (Signed)
BP is good at home 

## 2016-06-02 NOTE — Progress Notes (Signed)
Subjective:  Patient ID: [name], female    DOB: 06-08-1950  Age: 66 y.o. MRN: [id number]  CC: No chief complaint on file.   HPI [name] presents for a well exam. Planning to retire in 2 wks  Outpatient Medications Prior to Visit  Medication Sig Dispense Refill  . amLODipine (NORVASC) 2.5 MG tablet Take 1 tablet (2.5 mg total) by mouth daily. 90 tablet 3  . cholecalciferol (VITAMIN D) 1000 UNITS tablet Take 1 tablet (1,000 Units total) by mouth daily. 100 tablet 3  . ibuprofen (ADVIL,MOTRIN) 600 MG tablet Take twice a day x 2 weeks, then prn pain (Patient not taking: Reported on 05/23/2015) 60 tablet 1  . meloxicam (MOBIC) 15 MG tablet Take 1 tablet (15 mg total) by mouth daily as needed. Yearly physical w/labs due in march must see MD for refills 30 tablet 0  . Multiple Vitamin (MULTIVITAMIN) tablet Take 1 tablet by mouth daily.    . methocarbamol (ROBAXIN) 500 MG tablet Take 1 tablet (500 mg total) by mouth every 8 (eight) hours as needed for muscle spasms. 60 tablet 1  . oxybutynin (DITROPAN) 5 MG tablet Take 1 tablet by mouth daily.     No facility-administered medications prior to visit.     ROS Review of Systems  Constitutional: Negative for activity change, appetite change, chills, fatigue and unexpected weight change.  HENT: Negative for congestion, mouth sores and sinus pressure.   Eyes: Negative for visual disturbance.  Respiratory: Negative for cough and chest tightness.   Gastrointestinal: Negative for abdominal pain and nausea.  Genitourinary: Negative for difficulty urinating, frequency and vaginal pain.  Musculoskeletal: Negative for back pain and gait problem.  Skin: Negative for pallor and rash.  Neurological: Negative for dizziness, tremors, weakness, numbness and headaches.  Psychiatric/Behavioral: Negative for confusion, sleep disturbance and suicidal ideas.    Objective:  BP 136/82   Pulse 72   Temp 97.6 F (36.4 C)   Ht 5\' 4"  (1.626 m)    Wt 138 lb (62.6 kg)   SpO2 99%   BMI 23.69 kg/m   BP Readings from Last 3 Encounters:  06/02/16 136/82  05/23/15 (!) 150/90  03/18/15 (!) 148/100    Wt Readings from Last 3 Encounters:  06/02/16 138 lb (62.6 kg)  05/23/15 137 lb (62.1 kg)  03/18/15 139 lb (63 kg)    Physical Exam  Constitutional: She appears well-developed. No distress.  HENT:  Head: Normocephalic.  Right Ear: External ear normal.  Left Ear: External ear normal.  Nose: Nose normal.  Mouth/Throat: Oropharynx is clear and moist.  Eyes: Conjunctivae are normal. Pupils are equal, round, and reactive to light. Right eye exhibits no discharge. Left eye exhibits no discharge.  Neck: Normal range of motion. Neck supple. No JVD present. No tracheal deviation present. No thyromegaly present.  Cardiovascular: Normal rate, regular rhythm and normal heart sounds.   Pulmonary/Chest: No stridor. No respiratory distress. She has no wheezes.  Abdominal: Soft. Bowel sounds are normal. She exhibits no distension and no mass. There is no tenderness. There is no rebound and no guarding.  Musculoskeletal: She exhibits no edema or tenderness.  Lymphadenopathy:    She has no cervical adenopathy.  Neurological: She displays normal reflexes. No cranial nerve deficit. She exhibits normal muscle tone. Coordination normal.  Skin: No rash noted. No erythema.  Psychiatric: She has a normal mood and affect. Her behavior is normal. Judgment and thought content normal.    Lab Results  Component Value  Date   WBC 6.3 05/28/2015   HGB 12.2 05/28/2015   HCT 36.8 05/28/2015   PLT 309.0 05/28/2015   GLUCOSE 92 05/28/2015   CHOL 200 05/28/2015   TRIG 67.0 05/28/2015   HDL 82.70 05/28/2015   LDLDIRECT 90.1 03/22/2012   LDLCALC 104 (H) 05/28/2015   ALT 13 05/28/2015   AST 17 05/28/2015   NA 141 05/28/2015   K 4.2 05/28/2015   CL 104 05/28/2015   CREATININE 0.80 05/28/2015   BUN 15 05/28/2015   CO2 31 05/28/2015   TSH 1.73 05/28/2015     Dg Hand Complete Right  Result Date: 03/29/2012 *RADIOLOGY REPORT* Clinical Data: Pain, swelling RIGHT HAND - COMPLETE 3+ VIEW Comparison: None. Findings: Three views of the right hand submitted.  No acute fracture or subluxation.  Mild degenerative changes distal interphalangeal joints second finger. IMPRESSION: No acute fracture or subluxation.  Mild degenerative changes distal interphalangeal joint second finger. Original Report Authenticated By: Lahoma Crocker, M.D.    Assessment & Plan:   There are no diagnoses linked to this encounter. I have discontinued Ms. Gust's methocarbamol and oxybutynin. I am also having her maintain her multivitamin, cholecalciferol, ibuprofen, amLODipine, and meloxicam.  No orders of the defined types were placed in this encounter.    Follow-up: No Follow-up on file.  Walker Kehr, MD

## 2016-06-02 NOTE — Assessment & Plan Note (Addendum)
We discussed age appropriate health related issues, including available/recomended screening tests and vaccinations. We discussed a need for adhering to healthy diet and exercise. Labs/EKG were reviewed/ordered. All questions were answered.  Prevnar PAP/mammo/DEXA - Dr Gertie Fey

## 2016-06-02 NOTE — Addendum Note (Signed)
Addended by: Elta Guadeloupe on: 06/02/2016 03:00 PM   Modules accepted: Orders

## 2016-06-04 ENCOUNTER — Other Ambulatory Visit (INDEPENDENT_AMBULATORY_CARE_PROVIDER_SITE_OTHER): Payer: Federal, State, Local not specified - PPO

## 2016-06-04 DIAGNOSIS — Z0000 Encounter for general adult medical examination without abnormal findings: Secondary | ICD-10-CM

## 2016-06-04 LAB — CBC WITH DIFFERENTIAL/PLATELET
BASOS ABS: 0.1 10*3/uL (ref 0.0–0.1)
Basophils Relative: 1.5 % (ref 0.0–3.0)
EOS ABS: 0.2 10*3/uL (ref 0.0–0.7)
Eosinophils Relative: 2.5 % (ref 0.0–5.0)
HCT: 39 % (ref 36.0–46.0)
HEMOGLOBIN: 12.7 g/dL (ref 12.0–15.0)
LYMPHS PCT: 31.4 % (ref 12.0–46.0)
Lymphs Abs: 2 10*3/uL (ref 0.7–4.0)
MCHC: 32.5 g/dL (ref 30.0–36.0)
MCV: 85.5 fl (ref 78.0–100.0)
MONO ABS: 0.5 10*3/uL (ref 0.1–1.0)
Monocytes Relative: 8.5 % (ref 3.0–12.0)
Neutro Abs: 3.5 10*3/uL (ref 1.4–7.7)
Neutrophils Relative %: 56.1 % (ref 43.0–77.0)
Platelets: 285 10*3/uL (ref 150.0–400.0)
RBC: 4.56 Mil/uL (ref 3.87–5.11)
RDW: 13.7 % (ref 11.5–15.5)
WBC: 6.3 10*3/uL (ref 4.0–10.5)

## 2016-06-04 LAB — LIPID PANEL
Cholesterol: 206 mg/dL — ABNORMAL HIGH (ref 0–200)
HDL: 82 mg/dL (ref >39.00)
LDL CALC: 113 mg/dL — AB (ref 0–99)
NONHDL: 123.93
TRIGLYCERIDES: 57 mg/dL (ref 0.0–149.0)
Total CHOL/HDL Ratio: 3
VLDL: 11.4 mg/dL (ref 0.0–40.0)

## 2016-06-04 LAB — URINALYSIS
Ketones, ur: NEGATIVE
Leukocytes, UA: NEGATIVE
Nitrite: NEGATIVE
PH: 5.5 (ref 5.0–8.0)
TOTAL PROTEIN, URINE-UPE24: NEGATIVE
Urine Glucose: NEGATIVE
Urobilinogen, UA: 0.2 (ref 0.0–1.0)

## 2016-06-04 LAB — HEPATIC FUNCTION PANEL
ALK PHOS: 99 U/L (ref 39–117)
ALT: 13 U/L (ref 0–35)
AST: 15 U/L (ref 0–37)
Albumin: 4.2 g/dL (ref 3.5–5.2)
BILIRUBIN DIRECT: 0.1 mg/dL (ref 0.0–0.3)
BILIRUBIN TOTAL: 0.5 mg/dL (ref 0.2–1.2)
TOTAL PROTEIN: 7 g/dL (ref 6.0–8.3)

## 2016-06-04 LAB — BASIC METABOLIC PANEL
BUN: 16 mg/dL (ref 6–23)
CALCIUM: 9.4 mg/dL (ref 8.4–10.5)
CO2: 30 mEq/L (ref 19–32)
CREATININE: 0.82 mg/dL (ref 0.40–1.20)
Chloride: 105 mEq/L (ref 96–112)
GFR: 89.94 mL/min (ref >60.00)
Glucose, Bld: 90 mg/dL (ref 70–99)
Potassium: 4.4 mEq/L (ref 3.5–5.1)
Sodium: 141 mEq/L (ref 135–145)

## 2016-06-04 LAB — TSH: TSH: 1.28 u[IU]/mL (ref 0.35–4.50)

## 2016-06-07 ENCOUNTER — Other Ambulatory Visit: Payer: Self-pay | Admitting: Internal Medicine

## 2016-08-30 ENCOUNTER — Encounter: Payer: Self-pay | Admitting: Internal Medicine

## 2016-08-31 ENCOUNTER — Other Ambulatory Visit: Payer: Self-pay | Admitting: Internal Medicine

## 2017-02-09 ENCOUNTER — Ambulatory Visit (INDEPENDENT_AMBULATORY_CARE_PROVIDER_SITE_OTHER): Payer: Federal, State, Local not specified - PPO | Admitting: *Deleted

## 2017-02-09 DIAGNOSIS — Z23 Encounter for immunization: Secondary | ICD-10-CM | POA: Diagnosis not present

## 2017-06-20 ENCOUNTER — Encounter: Payer: Federal, State, Local not specified - PPO | Admitting: Internal Medicine

## 2017-06-20 ENCOUNTER — Other Ambulatory Visit (INDEPENDENT_AMBULATORY_CARE_PROVIDER_SITE_OTHER): Payer: Federal, State, Local not specified - PPO

## 2017-06-20 ENCOUNTER — Other Ambulatory Visit: Payer: Federal, State, Local not specified - PPO

## 2017-06-20 ENCOUNTER — Ambulatory Visit (INDEPENDENT_AMBULATORY_CARE_PROVIDER_SITE_OTHER): Payer: Federal, State, Local not specified - PPO | Admitting: Internal Medicine

## 2017-06-20 ENCOUNTER — Encounter: Payer: Self-pay | Admitting: Internal Medicine

## 2017-06-20 VITALS — BP 134/82 | HR 68 | Temp 98.4°F | Ht 64.0 in | Wt 140.0 lb

## 2017-06-20 DIAGNOSIS — Z0000 Encounter for general adult medical examination without abnormal findings: Secondary | ICD-10-CM

## 2017-06-20 DIAGNOSIS — S7012XS Contusion of left thigh, sequela: Secondary | ICD-10-CM | POA: Diagnosis not present

## 2017-06-20 DIAGNOSIS — I10 Essential (primary) hypertension: Secondary | ICD-10-CM

## 2017-06-20 DIAGNOSIS — M797 Fibromyalgia: Secondary | ICD-10-CM

## 2017-06-20 DIAGNOSIS — Z23 Encounter for immunization: Secondary | ICD-10-CM | POA: Diagnosis not present

## 2017-06-20 LAB — HEPATIC FUNCTION PANEL
ALT: 13 U/L (ref 0–35)
AST: 16 U/L (ref 0–37)
Albumin: 4.4 g/dL (ref 3.5–5.2)
Alkaline Phosphatase: 97 U/L (ref 39–117)
BILIRUBIN DIRECT: 0.1 mg/dL (ref 0.0–0.3)
TOTAL PROTEIN: 7.2 g/dL (ref 6.0–8.3)
Total Bilirubin: 0.5 mg/dL (ref 0.2–1.2)

## 2017-06-20 LAB — CBC WITH DIFFERENTIAL/PLATELET
BASOS PCT: 1 % (ref 0.0–3.0)
Basophils Absolute: 0.1 10*3/uL (ref 0.0–0.1)
EOS ABS: 0.2 10*3/uL (ref 0.0–0.7)
Eosinophils Relative: 3.7 % (ref 0.0–5.0)
HCT: 40 % (ref 36.0–46.0)
Hemoglobin: 13.2 g/dL (ref 12.0–15.0)
Lymphocytes Relative: 30 % (ref 12.0–46.0)
Lymphs Abs: 1.6 10*3/uL (ref 0.7–4.0)
MCHC: 33.1 g/dL (ref 30.0–36.0)
MCV: 84 fl (ref 78.0–100.0)
MONO ABS: 0.5 10*3/uL (ref 0.1–1.0)
Monocytes Relative: 9.5 % (ref 3.0–12.0)
NEUTROS ABS: 3 10*3/uL (ref 1.4–7.7)
NEUTROS PCT: 55.8 % (ref 43.0–77.0)
PLATELETS: 304 10*3/uL (ref 150.0–400.0)
RBC: 4.76 Mil/uL (ref 3.87–5.11)
RDW: 14.1 % (ref 11.5–15.5)
WBC: 5.3 10*3/uL (ref 4.0–10.5)

## 2017-06-20 LAB — URINALYSIS
BILIRUBIN URINE: NEGATIVE
HGB URINE DIPSTICK: NEGATIVE
KETONES UR: NEGATIVE
Leukocytes, UA: NEGATIVE
Nitrite: NEGATIVE
Specific Gravity, Urine: 1.02 (ref 1.000–1.030)
TOTAL PROTEIN, URINE-UPE24: NEGATIVE
URINE GLUCOSE: NEGATIVE
Urobilinogen, UA: 0.2 (ref 0.0–1.0)
pH: 5.5 (ref 5.0–8.0)

## 2017-06-20 LAB — BASIC METABOLIC PANEL
BUN: 10 mg/dL (ref 6–23)
CO2: 29 mEq/L (ref 19–32)
Calcium: 9.5 mg/dL (ref 8.4–10.5)
Chloride: 103 mEq/L (ref 96–112)
Creatinine, Ser: 0.8 mg/dL (ref 0.40–1.20)
GFR: 92.24 mL/min (ref >60.00)
Glucose, Bld: 99 mg/dL (ref 70–99)
POTASSIUM: 3.7 meq/L (ref 3.5–5.1)
SODIUM: 143 meq/L (ref 135–145)

## 2017-06-20 LAB — LIPID PANEL
CHOL/HDL RATIO: 3
Cholesterol: 207 mg/dL — ABNORMAL HIGH (ref 0–200)
HDL: 81.8 mg/dL (ref >39.00)
LDL CALC: 110 mg/dL — AB (ref 0–99)
NonHDL: 124.79
TRIGLYCERIDES: 73 mg/dL (ref 0.0–149.0)
VLDL: 14.6 mg/dL (ref 0.0–40.0)

## 2017-06-20 LAB — TSH: TSH: 1.54 u[IU]/mL (ref 0.35–4.50)

## 2017-06-20 MED ORDER — MELOXICAM 15 MG PO TABS: 15.0000 mg | ORAL_TABLET | Freq: Every day | ORAL | 1 refills | Status: DC

## 2017-06-20 MED ORDER — AMLODIPINE BESYLATE 2.5 MG PO TABS: 2.5000 mg | ORAL_TABLET | Freq: Every day | ORAL | 3 refills | Status: DC

## 2017-06-20 NOTE — Assessment & Plan Note (Signed)
Amlodipine.

## 2017-06-20 NOTE — Progress Notes (Signed)
Subjective:  Patient ID: [name], female    DOB: Jul 14, 1950  Age: 67 y.o. MRN: [id number]  CC: No chief complaint on file.   HPI Jlyn Cerros presents for a well exam. C/o residual L thigh swelling w/o pain  Outpatient Medications Prior to Visit  Medication Sig Dispense Refill  . amLODipine (NORVASC) 2.5 MG tablet Take 1 tablet (2.5 mg total) by mouth daily. 90 tablet 3  . amLODipine (NORVASC) 2.5 MG tablet TAKE 1 TABLET (2.5 MG TOTAL) BY MOUTH DAILY. 90 tablet 3  . cholecalciferol (VITAMIN D) 1000 UNITS tablet Take 1 tablet (1,000 Units total) by mouth daily. 100 tablet 3  . meloxicam (MOBIC) 15 MG tablet Take 1 tablet (15 mg total) by mouth daily. 90 tablet 1  . Multiple Vitamin (MULTIVITAMIN) tablet Take 1 tablet by mouth daily.     No facility-administered medications prior to visit.     ROS Review of Systems  Constitutional: Negative for activity change, appetite change, chills, fatigue and unexpected weight change.  HENT: Negative for congestion, mouth sores and sinus pressure.   Eyes: Negative for visual disturbance.  Respiratory: Negative for cough and chest tightness.   Gastrointestinal: Negative for abdominal pain and nausea.  Genitourinary: Negative for difficulty urinating, frequency and vaginal pain.  Musculoskeletal: Positive for arthralgias. Negative for back pain and gait problem.  Skin: Negative for pallor and rash.  Neurological: Negative for dizziness, tremors, weakness, numbness and headaches.  Psychiatric/Behavioral: Negative for confusion, sleep disturbance and suicidal ideas.    Objective:  BP 134/82 (BP Location: Left Arm, Patient Position: Sitting, Cuff Size: Normal)   Pulse 68   Temp 98.4 F (36.9 C) (Oral)   Ht 5\' 4"  (1.626 m)   Wt 140 lb (63.5 kg)   SpO2 97%   BMI 24.03 kg/m   BP Readings from Last 3 Encounters:  06/20/17 134/82  06/02/16 136/82  05/23/15 (!) 150/90    Wt Readings from Last 3 Encounters:  06/20/17 140 lb  (63.5 kg)  06/02/16 138 lb (62.6 kg)  05/23/15 137 lb (62.1 kg)    Physical Exam  Constitutional: She appears well-developed. No distress.  HENT:  Head: Normocephalic.  Right Ear: External ear normal.  Left Ear: External ear normal.  Nose: Nose normal.  Mouth/Throat: Oropharynx is clear and moist.  Eyes: Pupils are equal, round, and reactive to light. Conjunctivae are normal. Right eye exhibits no discharge. Left eye exhibits no discharge.  Neck: Normal range of motion. Neck supple. No JVD present. No tracheal deviation present. No thyromegaly present.  Cardiovascular: Normal rate, regular rhythm and normal heart sounds.  Pulmonary/Chest: No stridor. No respiratory distress. She has no wheezes.  Abdominal: Soft. Bowel sounds are normal. She exhibits no distension and no mass. There is no tenderness. There is no rebound and no guarding.  Musculoskeletal: She exhibits no edema or tenderness.  Lymphadenopathy:    She has no cervical adenopathy.  Neurological: She displays normal reflexes. No cranial nerve deficit. She exhibits normal muscle tone. Coordination normal.  Skin: No rash noted. No erythema.  Psychiatric: She has a normal mood and affect. Her behavior is normal. Judgment and thought content normal.  residual swelling L lat thigh - smaller than before  Lab Results  Component Value Date   WBC 6.3 06/04/2016   HGB 12.7 06/04/2016   HCT 39.0 06/04/2016   PLT 285.0 06/04/2016   GLUCOSE 90 06/04/2016   CHOL 206 (H) 06/04/2016   TRIG 57.0 06/04/2016   HDL 82.00  06/04/2016   LDLDIRECT 90.1 03/22/2012   LDLCALC 113 (H) 06/04/2016   ALT 13 06/04/2016   AST 15 06/04/2016   NA 141 06/04/2016   K 4.4 06/04/2016   CL 105 06/04/2016   CREATININE 0.82 06/04/2016   BUN 16 06/04/2016   CO2 30 06/04/2016   TSH 1.28 06/04/2016    Dg Hand Complete Right  Result Date: 03/29/2012 *RADIOLOGY REPORT* Clinical Data: Pain, swelling RIGHT HAND - COMPLETE 3+ VIEW Comparison: None.  Findings: Three views of the right hand submitted.  No acute fracture or subluxation.  Mild degenerative changes distal interphalangeal joints second finger. IMPRESSION: No acute fracture or subluxation.  Mild degenerative changes distal interphalangeal joint second finger. Original Report Authenticated By: Lahoma Crocker, M.D.    Assessment & Plan:   There are no diagnoses linked to this encounter. I am having [name] maintain her multivitamin, cholecalciferol, amLODipine, amLODipine, and meloxicam.  No orders of the defined types were placed in this encounter.    Follow-up: No follow-ups on file.  Walker Kehr, MD

## 2017-06-20 NOTE — Assessment & Plan Note (Signed)
meloxicam prn 

## 2017-06-20 NOTE — Assessment & Plan Note (Signed)
Residual swelling Sports med ref

## 2017-07-13 NOTE — Progress Notes (Signed)
[facility] [address], [place] [address] Phone: [phone] Subjective:    I'm seeing this patient by the request  of:  Plotnikov, Evie Lacks, MD   CC: Left thigh mass  [id number]  Oneita Allmon is a 67 y.o. female coming in with complaint of left leg pain. She had had a hematoma for two years. Patient fell off a ladder. She said that the size of the hematoma changes occasionally. No pain. She is concerned that it may cause other problems.   Onset- 2 years ago Location- left leg Duration- no pain Character- no pain Aggravating factors- standing long periods increases swelling Reliving factors- intermittent standing  Severity-6 out of 10  due to appearance but not secondary to pain    Past Medical History:  Diagnosis Date  . ANEMIA-NOS 11/07/2007  . HIP PAIN 02/06/2010  . HYPERGLYCEMIA 02/06/2010  . HYPERTENSION 11/07/2007  . OSTEOARTHRITIS 11/07/2007  . URI 08/08/2009   Past Surgical History:  Procedure Laterality Date  . ABDOMINAL HYSTERECTOMY  2010   Social History   Socioeconomic History  . Marital status: Married    Spouse name: Not on file  . Number of children: Not on file  . Years of education: Not on file  . Highest education level: Not on file  Occupational History  . Not on file  Social Needs  . Financial resource strain: Not on file  . Food insecurity:    Worry: Not on file    Inability: Not on file  . Transportation needs:    Medical: Not on file    Non-medical: Not on file  Tobacco Use  . Smoking status: Former Smoker    Last attempt to quit: 03/01/1973    Years since quitting: 44.4  . Smokeless tobacco: Never Used  Substance and Sexual Activity  . Alcohol use: No  . Drug use: No  . Sexual activity: Yes  Lifestyle  . Physical activity:    Days per week: Not on file    Minutes per session: Not on file  . Stress: Not on file  Relationships  . Social connections:    Talks on phone: Not on file    Gets  together: Not on file    Attends religious service: Not on file    Active member of club or organization: Not on file    Attends meetings of clubs or organizations: Not on file    Relationship status: Not on file  Other Topics Concern  . Not on file  Social History Narrative  . Not on file   Allergies  Allergen Reactions  . Lyrica [Pregabalin]     Extreme dry mouth, dizziness  . Tramadol     nausea   Family History  Problem Relation Age of Onset  . Cancer Father        mesothelioma  . Hypertension Other   . Colon cancer Neg Hx      Past medical history, social, surgical and family history all reviewed in electronic medical record.  No pertanent information unless stated regarding to the chief complaint.   Review of Systems:Review of systems updated and as accurate as of 07/14/17  No headache, visual changes, nausea, vomiting, diarrhea, constipation, dizziness, abdominal pain, skin rash, fevers, chills, night sweats, weight loss, swollen lymph nodes, body aches, joint swelling, muscle aches, chest pain, shortness of breath, mood changes.   Objective  Blood pressure 132/84, pulse 74, height 5\' 4"  (1.626 m), weight 138 lb (  62.6 kg), SpO2 98 %. Systems examined below as of 07/14/17   General: No apparent distress alert and oriented x3 mood and affect normal, dressed appropriately.  HEENT: Pupils equal, extraocular movements intact  Respiratory: Patient's speak in full sentences and does not appear short of breath  Cardiovascular: No lower extremity edema, non tender, no erythema  Skin: Warm dry intact with no signs of infection or rash on extremities or on axial skeleton.  Abdomen: Soft nontender  Neuro: Cranial nerves II through XII are intact, neurovascularly intact in all extremities with 2+ DTRs and 2+ pulses.  Lymph: No lymphadenopathy of posterior or anterior cervical chain or axillae bilaterally.  Gait normal with good balance and coordination.  MSK:  Non tender with  full range of motion and good stability and symmetric strength and tone of shoulders, elbows, wrist, hip, knee and ankles bilaterally.  Left leg area shows the patient is on the lateral aspect have a small mass noted.  Movable.  Somewhat fluctuant.  No erythema.  Seems to be more fatty tissue to ensure fluid.   Limited musculoskeletal ultrasound was performed and interpreted by Lyndal Pulley  Limited ultrasound shows some fatty tissue noted.  Appears to be more of a lipoma.  Possibly some fluctuance noted with some possible heterotropic changes.  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    Impression and Recommendations:     This case required medical decision making of moderate complexity.      Note: This dictation was prepared with Dragon dictation along with smaller phrase technology. Any transcriptional errors that result from this process are unintentional.

## 2017-07-14 ENCOUNTER — Ambulatory Visit: Payer: Self-pay

## 2017-07-14 ENCOUNTER — Ambulatory Visit: Payer: Federal, State, Local not specified - PPO | Admitting: Family Medicine

## 2017-07-14 ENCOUNTER — Encounter: Payer: Self-pay | Admitting: Family Medicine

## 2017-07-14 VITALS — BP 132/84 | HR 74 | Ht 64.0 in | Wt 138.0 lb

## 2017-07-14 DIAGNOSIS — M79605 Pain in left leg: Secondary | ICD-10-CM | POA: Diagnosis not present

## 2017-07-14 DIAGNOSIS — R2242 Localized swelling, mass and lump, left lower limb: Secondary | ICD-10-CM | POA: Diagnosis not present

## 2017-07-14 NOTE — Assessment & Plan Note (Signed)
It appears to be more of a lipoma.  Differential does include a coagulated hematoma.  We discussed the possibility of attempted aspiration which patient declined.  We will try compression as well as heat and massage.  See if she can break up any coagulation if there is any there.  No significant calcific findings and no vascularity dynamics been concerning for any type of other pathology.  Patient will follow-up with me again in 4 weeks.  If continuing to be concerned that aspiration is a possibility

## 2017-07-14 NOTE — Patient Instructions (Signed)
Good to see you  Thigh compression sleeve when working out or a lot of walking.  Something even at [facility] Heat for 10 minutes and massage the area to see  I do think most of it is a lipoma but no abnormal characteristics See me again in 4 weeks and we can look at it again and then decide if we want to drain it.

## 2018-01-24 ENCOUNTER — Ambulatory Visit (INDEPENDENT_AMBULATORY_CARE_PROVIDER_SITE_OTHER): Payer: Federal, State, Local not specified - PPO

## 2018-01-24 DIAGNOSIS — Z23 Encounter for immunization: Secondary | ICD-10-CM

## 2018-04-03 ENCOUNTER — Other Ambulatory Visit: Payer: Self-pay | Admitting: Obstetrics and Gynecology

## 2018-04-03 DIAGNOSIS — R928 Other abnormal and inconclusive findings on diagnostic imaging of breast: Secondary | ICD-10-CM

## 2018-04-13 ENCOUNTER — Ambulatory Visit
Admission: RE | Admit: 2018-04-13 | Discharge: 2018-04-13 | Disposition: A | Discharge status: Home / Self Care | Payer: Federal, State, Local not specified - PPO | Source: Ambulatory Visit | Attending: Obstetrics and Gynecology | Admitting: Obstetrics and Gynecology

## 2018-04-13 ENCOUNTER — Other Ambulatory Visit: Payer: Self-pay | Admitting: Obstetrics and Gynecology

## 2018-04-13 DIAGNOSIS — N6310 Unspecified lump in the right breast, unspecified quadrant: Secondary | ICD-10-CM

## 2018-04-13 DIAGNOSIS — R928 Other abnormal and inconclusive findings on diagnostic imaging of breast: Secondary | ICD-10-CM

## 2018-04-14 ENCOUNTER — Other Ambulatory Visit: Payer: Self-pay | Admitting: Obstetrics and Gynecology

## 2018-04-14 ENCOUNTER — Ambulatory Visit
Admission: RE | Admit: 2018-04-14 | Discharge: 2018-04-14 | Disposition: A | Discharge status: Home / Self Care | Payer: Federal, State, Local not specified - PPO | Source: Ambulatory Visit | Attending: Obstetrics and Gynecology | Admitting: Obstetrics and Gynecology

## 2018-04-14 DIAGNOSIS — N6310 Unspecified lump in the right breast, unspecified quadrant: Secondary | ICD-10-CM

## 2018-04-14 DIAGNOSIS — R921 Mammographic calcification found on diagnostic imaging of breast: Secondary | ICD-10-CM

## 2018-04-18 DIAGNOSIS — C50919 Malignant neoplasm of unspecified site of unspecified female breast: Secondary | ICD-10-CM | POA: Insufficient documentation

## 2018-04-20 ENCOUNTER — Ambulatory Visit
Admission: RE | Admit: 2018-04-20 | Discharge: 2018-04-20 | Disposition: A | Discharge status: Home / Self Care | Payer: Federal, State, Local not specified - PPO | Source: Ambulatory Visit | Attending: Obstetrics and Gynecology | Admitting: Obstetrics and Gynecology

## 2018-04-20 ENCOUNTER — Other Ambulatory Visit: Payer: Self-pay | Admitting: Obstetrics and Gynecology

## 2018-04-20 DIAGNOSIS — R921 Mammographic calcification found on diagnostic imaging of breast: Secondary | ICD-10-CM

## 2018-04-21 ENCOUNTER — Encounter: Payer: Self-pay | Admitting: *Deleted

## 2018-04-21 ENCOUNTER — Telehealth: Payer: Self-pay | Admitting: Oncology

## 2018-04-21 DIAGNOSIS — D0511 Intraductal carcinoma in situ of right breast: Secondary | ICD-10-CM | POA: Insufficient documentation

## 2018-04-21 NOTE — Telephone Encounter (Signed)
Spoke to patient to confirm afternoon [facility] appointment for 2/26, packet already given to patient

## 2018-04-24 ENCOUNTER — Telehealth: Payer: Self-pay | Admitting: *Deleted

## 2018-04-24 NOTE — Telephone Encounter (Signed)
Left a voicemail for the patient to let her know I passed her questions regarding insurance on to our billing department and that she should expect a call from [place].  I also reached out to the breast navigators [place] and [place].  Will continue to follow as necessary.  Gloriajean Dell. Leonie Green, BSN

## 2018-04-25 ENCOUNTER — Encounter: Payer: Self-pay | Admitting: Oncology

## 2018-04-25 NOTE — Progress Notes (Signed)
[facility]  [phone]) [phone] Fax:(336) [phone]     ID: [name] DOB: 03/09/1950  MR#: [id number]  KGU#:[id number]  Patient Care Team: Cassandria Anger, MD as PCP - General Everlene Farrier, MD as Attending Physician (Obstetrics and Gynecology) Ladene Artist, MD (Gastroenterology) Mauro Kaufmann, RN as Oncology Nurse Navigator Rockwell Germany, RN as Oncology Nurse Navigator Erroll Luna, MD as Consulting Physician (General Surgery) Magrinat, Virgie Dad, MD as Consulting Physician (Oncology) Kyung Rudd, MD as Consulting Physician (Radiation Oncology) Chauncey Cruel, MD OTHER MD:  CHIEF COMPLAINT: Ductal carcinoma in situ, estrogen receptor negative  CURRENT TREATMENT: Awaiting definitive surgery   HISTORY OF CURRENT ILLNESS: Krisandra Bueno had routine screening mammography on 04/03/2018 showing a possible abnormality in the right breast. She underwent bilateral diagnostic mammography with tomography and right breast ultrasonography at [facility] on 04/13/2018 showing: a 5.6 cm group of suspicious pleomorphic calcifications in the upper-outer quadrant of the right breast; in this area sonographically, there are 2 small suspicious subcentimeter masses which lie 2 cm apart; no evidence of right axillary lymphadenopathy.   Accordingly on 04/14/2018 she proceeded to biopsy of the 10 o'clock and 11 o'clock regions of the right breast. 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 Prognostic indicators significant for: estrogen receptor, 0% negative and progesterone receptor, 0% negative.   Additional biopsies were performed on 04/20/2018 of the retroareolar region and upper-outer quadrant. Pathology [id number] from this procedure confirmed ductal carcinoma in situ, high grade, with calcifications and necrosis.  A prognostic panel was not repeated  The patient's subsequent  history is as detailed below.   INTERVAL HISTORY: Mylie was evaluated in the multidisciplinary breast cancer clinic on 04/25/2018 accompanied by her husband. Her case was also presented at the multidisciplinary breast cancer conference on the same day. At that time a preliminary plan was proposed: Definitive surgery, possibly mastectomy; if so, no adjuvant radiation or antiestrogens anticipated   REVIEW OF SYSTEMS: [name] reports doing well overall. On patient questionnaire, she reports night sweats, wearing glasses/contacts, sinus problems, and arthritis. There were no specific symptoms leading to the original mammogram, which was routinely scheduled. The patient denies unusual headaches, visual changes, nausea, vomiting, stiff neck, dizziness, or gait imbalance. There has been no cough, phlegm production, or pleurisy, no chest pain or pressure, and no change in bowel or bladder habits. The patient denies fever, rash, bleeding, unexplained fatigue or unexplained weight loss. A detailed review of systems was otherwise entirely negative.   PAST MEDICAL HISTORY: Past Medical History:  Diagnosis Date  . ANEMIA-NOS 11/07/2007  . HIP PAIN 02/06/2010  . HYPERGLYCEMIA 02/06/2010  . HYPERTENSION 11/07/2007  . OSTEOARTHRITIS 11/07/2007  . URI 08/08/2009  Seasonal allergies  PAST SURGICAL HISTORY: Past Surgical History:  Procedure Laterality Date  . ABDOMINAL HYSTERECTOMY  2010    FAMILY HISTORY Family History  Problem Relation Age of Onset  . Cancer Father        mesothelioma  . Diabetes Mother   . Hypertension Mother   . Hypertension Other   . Breast cancer Paternal Aunt 49  . Ovarian cancer Maternal Aunt        late 3s  . Lupus Daughter   . Colon cancer Neg Hx    Patient father was 20 years old when he died from mesothelioma. Patient mother is alive at age 59. Patient notes a family hx of breast and ovarian cancer.  She reports a paternal aunt with breast cancer at age 55. She  also reports a maternal aunt with ovarian cancer in her late 40s. She has 13 siblings, 8 brothers and 5 sisters.  GYNECOLOGIC HISTORY:  No LMP recorded. Patient has had a hysterectomy. Menarche: 68 years old Age at first live birth: 68 years old [place] P 3 LMP 11/2006 Contraceptive/Hormone shots: used from 1972-1975 HRT never used  Hysterectomy? Yes, 03/19/2008 BSO? 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 She is married. Husband [name] is retired from working with the post office. Daughter [name], age 35, lives in [place], [place] as a retired Nature conservation officer (as military diabled; she has lupus). Son [name], age 86, lives in [place], [place] as a Sports administrator. Son [name] also lives in [place], [place] as a Music therapist. She attends [employer]. She has 1 grandchild who is under a year old.     ADVANCED DIRECTIVES: Living will in place; husband [name] is HCPOA.   HEALTH MAINTENANCE: Social History   Tobacco Use  . Smoking status: Former Smoker    Last attempt to quit: 03/01/1973    Years since quitting: 45.1  . Smokeless tobacco: Never Used  Substance Use Topics  . Alcohol use: No  . Drug use: No     Colonoscopy: 06/21/2013, normal, Dr. Fuller Plan, repeat in 5 years  PAP: 03/2017, per patient  Bone density: 2018, osteopenia, per patient   Allergies  Allergen Reactions  . Lyrica [Pregabalin]     Extreme dry mouth, dizziness  . Tramadol Nausea And Vomiting and Other (See Comments)    Dizziness    Current Outpatient Medications  Medication Sig Dispense Refill  . amLODipine (NORVASC) 2.5 MG tablet Take 1 tablet (2.5 mg total) by mouth daily. 90 tablet 3  . cholecalciferol (VITAMIN D) 1000 UNITS tablet Take 1 tablet (1,000 Units total) by mouth daily. 100 tablet 3  . meloxicam (MOBIC) 15 MG tablet Take 1 tablet (15 mg total) by mouth daily. 90 tablet 1  . Multiple Vitamin (MULTIVITAMIN) tablet Take 1 tablet by mouth daily.     No current  facility-administered medications for this visit.     OBJECTIVE: Middle-aged African-American woman who appears well  Vitals:   04/26/18 1242  BP: (!) 161/86  Pulse: 82  Resp: 18  Temp: 98.2 F (36.8 C)  SpO2: 98%     Body mass index is 23.14 kg/m.   Wt Readings from Last 3 Encounters:  04/26/18 134 lb 12.8 oz (61.1 kg)  07/14/17 138 lb (62.6 kg)  06/20/17 140 lb (63.5 kg)      ECOG FS:0 - Asymptomatic  Ocular: Sclerae unicteric, pupils round and equal Ear-nose-throat: Oropharynx clear and moist Lymphatic: No cervical or supraclavicular adenopathy Lungs no rales or rhonchi Heart regular rate and rhythm Abd soft, nontender, positive bowel sounds MSK no focal spinal tenderness, no joint edema Neuro: non-focal, well-oriented, appropriate affect Breasts: The right breast is status post recent biopsy.  The left breast is benign.  Both axillae are benign.  LAB RESULTS:  CMP     Component Value Date/Time   NA 143 04/26/2018 1227   K 3.4 (L) 04/26/2018 1227   CL 105 04/26/2018 1227   CO2 28 04/26/2018 1227   GLUCOSE 116 (H) 04/26/2018 1227   BUN 13 04/26/2018 1227   CREATININE 0.83 04/26/2018 1227   CALCIUM 9.8 04/26/2018 1227   PROT 7.4 04/26/2018 1227   ALBUMIN  4.2 04/26/2018 1227   AST 17 04/26/2018 1227   ALT 13 04/26/2018 1227   ALKPHOS 120 04/26/2018 1227   BILITOT 0.5 04/26/2018 1227   GFRNONAA >60 04/26/2018 1227   GFRAA >60 04/26/2018 1227    No results found for: TOTALPROTELP, ALBUMINELP, A1GS, A2GS, BETS, BETA2SER, GAMS, MSPIKE, SPEI  No results found for: KPAFRELGTCHN, LAMBDASER, [facility] [employer]  Lab Results  Component Value Date   WBC 5.8 04/26/2018   NEUTROABS 3.7 04/26/2018   HGB 12.6 04/26/2018   HCT 39.4 04/26/2018   MCV 87.9 04/26/2018   PLT 317 04/26/2018    _0 @  No results found for: LABCA2  No components found for: HOZYYQ825  No results for input(s): INR in the last 168 hours.  No results found for: LABCA2  No  results found for: OIB704  No results found for: UGQ916  No results found for: XIH038  No results found for: CA2729  No components found for: HGQUANT  No results found for: CEA1 / No results found for: CEA1   No results found for: AFPTUMOR  No results found for: CHROMOGRNA  No results found for: PSA1  Appointment on 04/26/2018  Component Date Value Ref Range Status  . Sodium 04/26/2018 143  135 - 145 mmol/L Final  . Potassium 04/26/2018 3.4* 3.5 - 5.1 mmol/L Final  . Chloride 04/26/2018 105  98 - 111 mmol/L Final  . CO2 04/26/2018 28  22 - 32 mmol/L Final  . Glucose, Bld 04/26/2018 116* 70 - 99 mg/dL Final  . BUN 04/26/2018 13  8 - 23 mg/dL Final  . Creatinine 04/26/2018 0.83  0.44 - 1.00 mg/dL Final  . Calcium 04/26/2018 9.8  8.9 - 10.3 mg/dL Final  . Total Protein 04/26/2018 7.4  6.5 - 8.1 g/dL Final  . Albumin 04/26/2018 4.2  3.5 - 5.0 g/dL Final  . AST 04/26/2018 17  15 - 41 U/L Final  . ALT 04/26/2018 13  0 - 44 U/L Final  . Alkaline Phosphatase 04/26/2018 120  38 - 126 U/L Final  . Total Bilirubin 04/26/2018 0.5  0.3 - 1.2 mg/dL Final  . GFR, Est Non Af Am 04/26/2018 >60  >60 mL/min Final  . GFR, Est AFR Am 04/26/2018 >60  >60 mL/min Final  . Anion gap 04/26/2018 10  5 - 15 Final   Performed at [facility], [place] [address] [place], [place] [address]  . WBC Count 04/26/2018 5.8  4.0 - 10.5 K/uL Final  . RBC 04/26/2018 4.48  3.87 - 5.11 MIL/uL Final  . Hemoglobin 04/26/2018 12.6  12.0 - 15.0 g/dL Final  . HCT 04/26/2018 39.4  36.0 - 46.0 % Final  . MCV 04/26/2018 87.9  80.0 - 100.0 fL Final  . MCH 04/26/2018 28.1  26.0 - 34.0 pg Final  . MCHC 04/26/2018 32.0  30.0 - 36.0 g/dL Final  . RDW 04/26/2018 13.7  11.5 - 15.5 % Final  . Platelet Count 04/26/2018 317  150 - 400 K/uL Final  . nRBC 04/26/2018 0.0  0.0 - 0.2 % Final  . Neutrophils Relative % 04/26/2018 63  % Final  . Neutro Abs 04/26/2018 3.7  1.7 - 7.7 K/uL Final  . Lymphocytes  Relative 04/26/2018 27  % Final  . Lymphs Abs 04/26/2018 1.6  0.7 - 4.0 K/uL Final  . Monocytes Relative 04/26/2018 7  % Final  . Monocytes Absolute 04/26/2018 0.4  0.1 - 1.0 K/uL Final  . Eosinophils Relative 04/26/2018 2  % Final  .  Eosinophils Absolute 04/26/2018 0.1  0.0 - 0.5 K/uL Final  . Basophils Relative 04/26/2018 1  % Final  . Basophils Absolute 04/26/2018 0.1  0.0 - 0.1 K/uL Final  . Immature Granulocytes 04/26/2018 0  % Final  . Abs Immature Granulocytes 04/26/2018 0.02  0.00 - 0.07 K/uL Final   Performed at [facility], [address] [place], [place] [address]    (this displays the last labs from the last 3 days)  No results found for: TOTALPROTELP, ALBUMINELP, A1GS, A2GS, BETS, BETA2SER, GAMS, MSPIKE, SPEI (this displays SPEP labs)  No results found for: KPAFRELGTCHN, LAMBDASER, KAPLAMBRATIO (kappa/lambda light chains)  No results found for: HGBA, HGBA2QUANT, HGBFQUANT, HGBSQUAN (Hemoglobinopathy evaluation)   No results found for: LDH  No results found for: IRON, TIBC, IRONPCTSAT (Iron and TIBC)  No results found for: FERRITIN  Urinalysis    Component Value Date/Time   COLORURINE YELLOW 06/20/2017 [place] 06/20/2017 0903   LABSPEC 1.020 06/20/2017 0903   PHURINE 5.5 06/20/2017 0903   GLUCOSEU NEGATIVE 06/20/2017 0903   HGBUR NEGATIVE 06/20/2017 0903   BILIRUBINUR NEGATIVE 06/20/2017 0903   KETONESUR NEGATIVE 06/20/2017 0903   UROBILINOGEN 0.2 06/20/2017 0903   NITRITE NEGATIVE 06/20/2017 0903   LEUKOCYTESUR NEGATIVE 06/20/2017 1740     STUDIES: Mm Digital Diagnostic Unilat R  Result Date: 04/13/2018 CLINICAL DATA:  Screening recall for right breast calcifications. EXAM: DIGITAL DIAGNOSTIC RIGHT MAMMOGRAM WITH CAD ULTRASOUND RIGHT BREAST COMPARISON:  Previous exam(s). ACR Breast Density Category b: There are scattered areas of fibroglandular density. FINDINGS: In the upper-outer quadrant of the right  breast, anterior depth, there is a 5.6 cm group of pleomorphic calcifications in a segmental distribution. Mammographic images were processed with CAD. Ultrasound of the right breast at 11 o'clock, 3 cm from the nipple demonstrates an ill-defined irregular hypoechoic mass measuring 6 x 5 x 4 mm. Blood flow was seen within the mass on color Doppler imaging. An additional hypoechoic irregular mass, which may be within a duct, is seen at 10 o'clock, 4 cm from the nipple measuring 7 x 2 x 4 mm. Blood flow was also seen within this mass on color Doppler imaging. These 2 masses are approximately 2 cm apart. Ultrasound of the right axilla demonstrates multiple normal-appearing lymph nodes. IMPRESSION: 1. There is a 5.6 cm group of suspicious pleomorphic calcifications in the upper-outer quadrant of the right breast. 2. In this area sonographically, there are 2 small suspicious subcentimeter masses which lie at 2 cm apart. 3.  No evidence of right axillary lymphadenopathy. RECOMMENDATION: Ultrasound guided biopsy is recommended for the 2 small masses in the right breast at 10 o'clock and 11 o'clock. Consider specimen radiograph to ensure calcifications are within the biopsy samples. If no calcifications are seen, and the biopsies are benign, stereotactic biopsy of a region of calcifications is recommended. Stereotactic biopsy may also be indicated if demonstration of extent of disease is necessary. The procedure has been scheduled for 04/14/2018 at 2:45 p.m. I have discussed the findings and recommendations with the patient. Results were also provided in writing at the conclusion of the visit. If applicable, a reminder letter will be sent to the patient regarding the next appointment. BI-RADS CATEGORY  4: Suspicious. Electronically Signed   By: Ammie Ferrier M.D.   On: 04/13/2018 16:49   US Breast Ltd Uni Right Inc Axilla  Result Date: 04/13/2018 CLINICAL DATA:  Screening recall for right breast calcifications.  EXAM: DIGITAL DIAGNOSTIC RIGHT MAMMOGRAM  WITH CAD ULTRASOUND RIGHT BREAST COMPARISON:  Previous exam(s). ACR Breast Density Category b: There are scattered areas of fibroglandular density. FINDINGS: In the upper-outer quadrant of the right breast, anterior depth, there is a 5.6 cm group of pleomorphic calcifications in a segmental distribution. Mammographic images were processed with CAD. Ultrasound of the right breast at 11 o'clock, 3 cm from the nipple demonstrates an ill-defined irregular hypoechoic mass measuring 6 x 5 x 4 mm. Blood flow was seen within the mass on color Doppler imaging. An additional hypoechoic irregular mass, which may be within a duct, is seen at 10 o'clock, 4 cm from the nipple measuring 7 x 2 x 4 mm. Blood flow was also seen within this mass on color Doppler imaging. These 2 masses are approximately 2 cm apart. Ultrasound of the right axilla demonstrates multiple normal-appearing lymph nodes. IMPRESSION: 1. There is a 5.6 cm group of suspicious pleomorphic calcifications in the upper-outer quadrant of the right breast. 2. In this area sonographically, there are 2 small suspicious subcentimeter masses which lie at 2 cm apart. 3.  No evidence of right axillary lymphadenopathy. RECOMMENDATION: Ultrasound guided biopsy is recommended for the 2 small masses in the right breast at 10 o'clock and 11 o'clock. Consider specimen radiograph to ensure calcifications are within the biopsy samples. If no calcifications are seen, and the biopsies are benign, stereotactic biopsy of a region of calcifications is recommended. Stereotactic biopsy may also be indicated if demonstration of extent of disease is necessary. The procedure has been scheduled for 04/14/2018 at 2:45 p.m. I have discussed the findings and recommendations with the patient. Results were also provided in writing at the conclusion of the visit. If applicable, a reminder letter will be sent to the patient regarding the next appointment.  BI-RADS CATEGORY  4: Suspicious. Electronically Signed   By: Ammie Ferrier M.D.   On: 04/13/2018 16:49   Mm Clip Placement Right  Result Date: 04/20/2018 CLINICAL DATA:  Status post stereotactic core biopsies of right breast calcifications. EXAM: DIAGNOSTIC RIGHT MAMMOGRAM POST STEREOTACTIC BIOPSY COMPARISON:  Previous exam(s). FINDINGS: Mammographic images were obtained following stereotactic guided biopsy of calcifications in the retroareolar right breast and calcifications in the upper-outer quadrant right breast. Cc and lateral views of the right breast demonstrate ribbon biopsy clip in the expected position retroareolar right breast with associated hematoma. The images also demonstrate X biopsy clip in the expected position upper-outer quadrant right breast. IMPRESSION: Post biopsy mammogram demonstrating biopsy clips in the appropriate locations. Final Assessment: Post Procedure Mammograms for Marker Placement Electronically Signed   By: Abelardo Diesel M.D.   On: 04/20/2018 12:13   Mm Clip Placement Right  Result Date: 04/14/2018 CLINICAL DATA:  Post ultrasound-guided core needle biopsy of 2 right breast nodules, at 10 and 11 o'clock. EXAM: DIAGNOSTIC RIGHT MAMMOGRAM POST ULTRASOUND BIOPSY COMPARISON:  Previous exam(s). FINDINGS: Mammographic images were obtained following ultrasound guided biopsy of right breast. Two-view mammography demonstrates presence of ribbon shaped marker in coil shaped marker at 10 and 11 o'clock position respectively, in appropriate mammographic position. Per the diagnostic report accommodations, mammographic pictures of the specimens were taken looking for calcifications, however there were no calcifications in either of the specimens. Looking at the position of the clips, the sonographically demonstrated and sampled nodules are located more posteriorly in the breast than the group of calcifications identified mammographically. Therefore, stereotactic core needle biopsy  of the indeterminate calcifications in the right breast upper outer quadrant is still needed. I recommend sampling  2 sites, which are 3.6 cm apart, marked on the craniocaudal view, for determining the extent of the process. IMPRESSION: Successful placement of ribbon and coil shaped markers at the 10 and 11 o'clock right breast masses respectively. The patient is in need of 2 stereotactic core needle biopsies of the right breast calcifications, for which she will be scheduled. These findings and recommendations were discussed with the patient after the procedure. Final Assessment: Post Procedure Mammograms for Marker Placement Electronically Signed   By: Fidela Salisbury M.D.   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 This was found to be concordant by Dr. Abelardo Diesel. Pathology results were discussed with the patient by telephone. The patient reported doing well after the biopsy with tenderness at the site. Post biopsy instructions and care were reviewed and questions were answered. The patient was encouraged to call [facility] for any additional concerns. The patient has a recent diagnosis of right breast cancer and should follow her outlined treatment plan. The patient was referred to [facility] on April 26, 2018 Pathology results reported by Stacie Acres, RN on 04/21/2018. Electronically Signed   By: Abelardo Diesel M.D.   On: 04/21/2018 14:12   Result Date: 04/21/2018 CLINICAL DATA:  Patient has recent diagnosis ductal carcinoma in situ at 2 sites with ultrasound-guided core biopsies. Biopsies of anterior calcifications  of right breast were recommended to assess the extent of disease. EXAM: RIGHT BREAST STEREOTACTIC CORE NEEDLE BIOPSY COMPARISON:  Previous exams. FINDINGS: The patient and I discussed the procedure of stereotactic-guided biopsy including benefits and alternatives. We discussed the high likelihood of a successful procedure. We discussed the risks of the procedure including infection, bleeding, tissue injury, clip migration, and inadequate sampling. Informed written consent was given. The usual time out protocol was performed immediately prior to the procedure. Using sterile technique and 1% Lidocaine as local anesthetic, under stereotactic guidance, a 9 gauge vacuum assisted device was used to perform core needle biopsy of calcifications in the retroareolar right breast using a cranial approach. Specimen radiograph was performed showing inclusion a calcifications of concern. Specimens with calcifications are identified for pathology. Lesion quadrant: Retroareolar At the conclusion of the procedure, a ribbon tissue marker clip (the patient already has a ribbon clip more posteriorly located from the recent ultrasound-guided core biopsy but we only stock 3 biopsy clip shapes, therefore, we use another ribbon clip.) Was deployed into the biopsy cavity. Follow-up 2-view mammogram was performed and dictated separately. Using sterile technique and 1% Lidocaine as local anesthetic, under stereotactic guidance, a 9 gauge vacuum assisted device was used to perform core needle biopsy of calcifications in the upper-outer quadrant right breast using a cranial approach. Specimen radiograph was performed showing . Specimens with calcifications are identified for pathology. Lesion quadrant: Upper-outer quadrant At the conclusion of the procedure, a X tissue marker clip was deployed into the biopsy cavity. Follow-up 2-view mammogram was performed and dictated separately. IMPRESSION: Stereotactic-guided biopsiesof calcifications in  the right breast. No apparent complications. Electronically Signed: By: Abelardo Diesel M.D. On: 04/20/2018 12:12   Mm Rt Breast Bx W Loc Dev Ea Ad Lesion Img Bx Spec Stereo  Guide  Addendum Date: 04/21/2018   ADDENDUM REPORT: 04/21/2018 14:12 ADDENDUM: Pathology revealed HIGH GRADE DUCTAL CARCINOMA IN SITU WITH CALCIFICATIONS AND NECROSIS of the RIGHT breast, both locations, retroareolar and upper outer quadrant. This was found to be concordant by Dr. Abelardo Diesel. Pathology results were discussed with the patient by telephone. The patient reported doing well after the biopsy with tenderness at the site. Post biopsy instructions and care were reviewed and questions were answered. The patient was encouraged to call [facility] for any additional concerns. The patient has a recent diagnosis of right breast cancer and should follow her outlined treatment plan. The patient was referred to [facility] on April 26, 2018 Pathology results reported by Stacie Acres, RN on 04/21/2018. Electronically Signed   By: Abelardo Diesel M.D.   On: 04/21/2018 14:12   Result Date: 04/21/2018 CLINICAL DATA:  Patient has recent diagnosis ductal carcinoma in situ at 2 sites with ultrasound-guided core biopsies. Biopsies of anterior calcifications of right breast were recommended to assess the extent of disease. EXAM: RIGHT BREAST STEREOTACTIC CORE NEEDLE BIOPSY COMPARISON:  Previous exams. FINDINGS: The patient and I discussed the procedure of stereotactic-guided biopsy including benefits and alternatives. We discussed the high likelihood of a successful procedure. We discussed the risks of the procedure including infection, bleeding, tissue injury, clip migration, and inadequate sampling. Informed written consent was given. The usual time out protocol was performed immediately prior to the procedure. Using sterile technique and 1%  Lidocaine as local anesthetic, under stereotactic guidance, a 9 gauge vacuum assisted device was used to perform core needle biopsy of calcifications in the retroareolar right breast using a cranial approach. Specimen radiograph was performed showing inclusion a calcifications of concern. Specimens with calcifications are identified for pathology. Lesion quadrant: Retroareolar At the conclusion of the procedure, a ribbon tissue marker clip (the patient already has a ribbon clip more posteriorly located from the recent ultrasound-guided core biopsy but we only stock 3 biopsy clip shapes, therefore, we use another ribbon clip.) Was deployed into the biopsy cavity. Follow-up 2-view mammogram was performed and dictated separately. Using sterile technique and 1% Lidocaine as local anesthetic, under stereotactic guidance, a 9 gauge vacuum assisted device was used to perform core needle biopsy of calcifications in the upper-outer quadrant right breast using a cranial approach. Specimen radiograph was performed showing . Specimens with calcifications are identified for pathology. Lesion quadrant: Upper-outer quadrant At the conclusion of the procedure, a X tissue marker clip was deployed into the biopsy cavity. Follow-up 2-view mammogram was performed and dictated separately. IMPRESSION: Stereotactic-guided biopsiesof calcifications in the right breast. No apparent complications. Electronically Signed: By: Abelardo Diesel M.D. 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 This was found to be concordant by Dr. Fidela Salisbury. Pathology results were discussed with the patient by telephone. The patient reported doing well after the biopsies with tenderness at the sites. Post biopsy instructions and care  were reviewed and questions were answered. The patient was encouraged to call [facility] for any additional concerns. The patient was referred to [facility] on February, 26,  2020 She is scheduled for stereotactic biopsies of the RIGHT breast on April 20, 2018. Pathology results reported by Stacie Acres, RN on 04/18/2018. Electronically Signed   By: Fidela Salisbury M.D.   On: 04/18/2018 12:35   Result Date: 04/20/2018 CLINICAL DATA:  Right breast 10 o'clock and 11 o'clock small masses within an area of calcifications. EXAM: ULTRASOUND GUIDED RIGHT BREAST CORE NEEDLE BIOPSY COMPARISON:  Previous exam(s). FINDINGS: I met with the patient and we discussed the procedure of ultrasound-guided biopsy, including benefits and alternatives. We discussed the high likelihood of a successful procedure. We discussed the risks of the procedure, including infection, bleeding, tissue injury, clip migration, and inadequate sampling. Informed written consent was given. The usual time-out protocol was performed immediately prior to the procedure. Lesion quadrant: Upper outer quadrant Using sterile technique and 1% Lidocaine as local anesthetic, under direct ultrasound visualization, a 14 gauge spring-loaded device was used to perform biopsy of right breast 10 o'clock mass using a inferior approach. At the conclusion of the procedure a ribbon shaped tissue marker clip was deployed into the biopsy cavity. Next, using sterile technique and 1% Lidocaine as local anesthetic, under direct ultrasound visualization, a 14 gauge spring-loaded device was used to perform biopsy of right breast 11 o'clock mass using a inferior approach. At the conclusion of the procedure a coil shaped tissue marker clip was deployed into the biopsy cavity. Follow up 2 view mammogram was performed and dictated separately. IMPRESSION: Ultrasound guided  biopsy of the right breast. No apparent complications. Electronically Signed: By: Fidela Salisbury M.D. 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 This was found to be concordant by Dr. Fidela Salisbury. Pathology results were discussed with the patient by telephone. The patient reported doing well after the biopsies with tenderness at the sites. Post biopsy instructions and care were reviewed and questions were answered. The patient was encouraged to call [facility] for any additional concerns. The patient was referred to [facility] at [facility] on February, 26, 2020 She is scheduled for stereotactic biopsies of the RIGHT breast on April 20, 2018. Pathology results reported by Stacie Acres, RN on 04/18/2018. Electronically Signed   By: Fidela Salisbury M.D.   On: 04/18/2018 12:35   Result Date: 04/20/2018 CLINICAL DATA:  Right breast 10 o'clock and 11 o'clock small masses within an area of calcifications. EXAM: ULTRASOUND GUIDED RIGHT BREAST CORE NEEDLE BIOPSY COMPARISON:  Previous exam(s). FINDINGS: I met with the patient and we discussed the procedure of ultrasound-guided biopsy, including benefits and alternatives. We discussed the high likelihood of a successful procedure. We discussed the risks of the procedure, including infection, bleeding, tissue injury, clip migration, and inadequate sampling. Informed written consent was given. The usual time-out protocol was performed immediately prior to the procedure. Lesion quadrant: Upper outer quadrant Using sterile technique and 1% Lidocaine as local anesthetic, under direct ultrasound visualization, a 14 gauge spring-loaded device was  used to perform biopsy of right breast 10 o'clock mass using a inferior approach. At the conclusion of the procedure a ribbon shaped tissue marker clip was deployed into the biopsy cavity. Next, using sterile technique and 1% Lidocaine as local anesthetic, under direct ultrasound  visualization, a 14 gauge spring-loaded device was used to perform biopsy of right breast 11 o'clock mass using a inferior approach. At the conclusion of the procedure a coil shaped tissue marker clip was deployed into the biopsy cavity. Follow up 2 view mammogram was performed and dictated separately. IMPRESSION: Ultrasound guided biopsy of the right breast. No apparent complications. Electronically Signed: By: Fidela Salisbury M.D. On: 04/14/2018 15:41    ELIGIBLE FOR AVAILABLE RESEARCH PROTOCOL: no  ASSESSMENT: 68 y.o. [place] woman status post multiple (4) biopsies from the right breast, all showing ductal carcinoma in situ, high-grade, estrogen and progesterone receptor negative  (1) definitive surgery pending  (2) likely no adjuvant therapy needed  (3) genetics testing pending  PLAN: I spent approximately 60 minutes face to face with Enid Derry with more than 50% of that time spent in counseling and coordination of care. Specifically we reviewed the biology of the patient's diagnosis and the specifics of her situation.  Keishawn understands that in noninvasive ductal carcinoma, also called ductal carcinoma in situ ("DCIS") the breast cancer cells remain trapped in the ducts were they started. They cannot travel to a vital organ. For that reason these cancers in themselves are not life-threatening.  If the whole breast is removed then all the ducts are removed and since the cancer cells are trapped in the ducts, the cure rate with mastectomy for noninvasive breast cancer is approximately 99%.  This is relevant to Elishia's case, since after careful review of her mammographic and ultrasound studies we do not feel it  would be feasible for her to keep her breast even if she had neoadjuvant antiestrogens.  Note that at this point she has not yet decided if she wants reconstruction.  Tattiana does qualify for genetics testing. In patients who carry a deleterious mutation [for example in a  BRCA gene], the risk of a new breast cancer developing in the future may be sufficiently great that the patient may choose bilateral mastectomies. However if she wishes to keep her breasts in that situation it is safe to do so. That would require intensified screening, which generally means not only yearly mammography but a yearly breast MRI as well.   Accordingly the overall plan is for genetics testing, then surgery, then observation.  She will see me in a few months to discuss whether or not she would like to take prophylactic antiestrogens (which of course would not be relevant to her current tumor since it is estrogen receptor negative).  If not she may be released from follow-up at that point.  Samuel has a good understanding of the overall plan. She agrees with it. She knows the goal of treatment in her case is cure. She will call with any problems that may develop before her next visit here.  Chauncey Cruel, MD   04/26/2018 2:46 PM Medical Oncology and Hematology [facility] [address] [place], [place] [address] Tel. [phone]    Fax. [phone]  This document serves as a record of services personally performed by Lurline Del, MD. It was created on his behalf by Wilburn Mylar, a trained medical scribe. The creation of this record is based on the scribe's personal observations and the provider's statements to them.   I, Lurline Del MD, have reviewed the above documentation for accuracy and completeness, and I agree with the above.

## 2018-04-26 ENCOUNTER — Ambulatory Visit: Payer: Federal, State, Local not specified - PPO | Admitting: Physical Therapy

## 2018-04-26 ENCOUNTER — Ambulatory Visit
Admission: RE | Admit: 2018-04-26 | Discharge: 2018-04-26 | Disposition: A | Discharge status: Home / Self Care | Payer: Federal, State, Local not specified - PPO | Source: Ambulatory Visit | Attending: Radiation Oncology | Admitting: Radiation Oncology

## 2018-04-26 ENCOUNTER — Inpatient Hospital Stay: Payer: Federal, State, Local not specified - PPO

## 2018-04-26 ENCOUNTER — Inpatient Hospital Stay: Payer: Federal, State, Local not specified - PPO | Attending: Oncology | Admitting: Oncology

## 2018-04-26 ENCOUNTER — Ambulatory Visit: Payer: Self-pay | Admitting: Surgery

## 2018-04-26 ENCOUNTER — Telehealth: Payer: Self-pay | Admitting: Genetics

## 2018-04-26 ENCOUNTER — Encounter: Payer: Self-pay | Admitting: Oncology

## 2018-04-26 ENCOUNTER — Ambulatory Visit (HOSPITAL_BASED_OUTPATIENT_CLINIC_OR_DEPARTMENT_OTHER): Payer: Federal, State, Local not specified - PPO | Admitting: Genetics

## 2018-04-26 VITALS — BP 161/86 | HR 82 | Temp 98.2°F | Resp 18 | Ht 64.0 in | Wt 134.8 lb

## 2018-04-26 DIAGNOSIS — Z8249 Family history of ischemic heart disease and other diseases of the circulatory system: Secondary | ICD-10-CM

## 2018-04-26 DIAGNOSIS — Z87891 Personal history of nicotine dependence: Secondary | ICD-10-CM

## 2018-04-26 DIAGNOSIS — Z791 Long term (current) use of non-steroidal anti-inflammatories (NSAID): Secondary | ICD-10-CM | POA: Diagnosis not present

## 2018-04-26 DIAGNOSIS — Z803 Family history of malignant neoplasm of breast: Secondary | ICD-10-CM

## 2018-04-26 DIAGNOSIS — D0511 Intraductal carcinoma in situ of right breast: Secondary | ICD-10-CM

## 2018-04-26 DIAGNOSIS — Z79899 Other long term (current) drug therapy: Secondary | ICD-10-CM | POA: Diagnosis not present

## 2018-04-26 DIAGNOSIS — Z171 Estrogen receptor negative status [ER-]: Secondary | ICD-10-CM

## 2018-04-26 DIAGNOSIS — I10 Essential (primary) hypertension: Secondary | ICD-10-CM | POA: Diagnosis not present

## 2018-04-26 DIAGNOSIS — Z8041 Family history of malignant neoplasm of ovary: Secondary | ICD-10-CM

## 2018-04-26 LAB — CMP (CANCER CENTER ONLY)
ALT: 13 U/L (ref 0–44)
AST: 17 U/L (ref 15–41)
Albumin: 4.2 g/dL (ref 3.5–5.0)
Alkaline Phosphatase: 120 U/L (ref 38–126)
Anion gap: 10 (ref 5–15)
BILIRUBIN TOTAL: 0.5 mg/dL (ref 0.3–1.2)
BUN: 13 mg/dL (ref 8–23)
CO2: 28 mmol/L (ref 22–32)
Calcium: 9.8 mg/dL (ref 8.9–10.3)
Chloride: 105 mmol/L (ref 98–111)
Creatinine: 0.83 mg/dL (ref 0.44–1.00)
GFR, Est AFR Am: >60 mL/min (ref >60)
GFR, Estimated: >60 mL/min (ref >60)
Glucose, Bld: 116 mg/dL — ABNORMAL HIGH (ref 70–99)
Potassium: 3.4 mmol/L — ABNORMAL LOW (ref 3.5–5.1)
Sodium: 143 mmol/L (ref 135–145)
Total Protein: 7.4 g/dL (ref 6.5–8.1)

## 2018-04-26 LAB — CBC WITH DIFFERENTIAL (CANCER CENTER ONLY)
Abs Immature Granulocytes: 0.02 10*3/uL (ref 0.00–0.07)
Basophils Absolute: 0.1 10*3/uL (ref 0.0–0.1)
Basophils Relative: 1 %
Eosinophils Absolute: 0.1 10*3/uL (ref 0.0–0.5)
Eosinophils Relative: 2 %
HCT: 39.4 % (ref 36.0–46.0)
Hemoglobin: 12.6 g/dL (ref 12.0–15.0)
Immature Granulocytes: 0 %
Lymphocytes Relative: 27 %
Lymphs Abs: 1.6 10*3/uL (ref 0.7–4.0)
MCH: 28.1 pg (ref 26.0–34.0)
MCHC: 32 g/dL (ref 30.0–36.0)
MCV: 87.9 fL (ref 80.0–100.0)
Monocytes Absolute: 0.4 10*3/uL (ref 0.1–1.0)
Monocytes Relative: 7 %
Neutro Abs: 3.7 10*3/uL (ref 1.7–7.7)
Neutrophils Relative %: 63 %
Platelet Count: 317 10*3/uL (ref 150–400)
RBC: 4.48 MIL/uL (ref 3.87–5.11)
RDW: 13.7 % (ref 11.5–15.5)
WBC Count: 5.8 10*3/uL (ref 4.0–10.5)
nRBC: 0 % (ref 0.0–0.2)

## 2018-04-26 NOTE — H&P (Signed)
Leonette Nutting Documented: 04/26/2018 7:19 AM Location: [facility] Patient #: [id number] DOB: [birth year] Undefined / Language: Suszanne Conners / Race: Black or African American Female  History of Present Illness Marcello Moores A. Ivadell Gaul MD; 04/26/2018 2:33 PM) Patient words: Pt sent at the request of Dr Gaetano Net for SD mammographic abnormality of the right breast. Area of 5.6 cm of calcifications noted and core bx shows DCIS. No history of mass lesion nipple discharge or pain.                       Screening recall for right breast calcifications.  EXAM: DIGITAL DIAGNOSTIC RIGHT MAMMOGRAM WITH CAD  ULTRASOUND RIGHT BREAST  COMPARISON: Previous exam(s).  ACR Breast Density Category b: There are scattered areas of fibroglandular density.  FINDINGS: In the upper-outer quadrant of the right breast, anterior depth, there is a 5.6 cm group of pleomorphic calcifications in a segmental distribution.  Mammographic images were processed with CAD.  Ultrasound of the right breast at 11 o'clock, 3 cm from the nipple demonstrates an ill-defined irregular hypoechoic mass measuring 6 x 5 x 4 mm. Blood flow was seen within the mass on color Doppler imaging. An additional hypoechoic irregular mass, which may be within a duct, is seen at 10 o'clock, 4 cm from the nipple measuring 7 x 2 x 4 mm. Blood flow was also seen within this mass on color Doppler imaging. These 2 masses are approximately 2 cm apart. Ultrasound of the right axilla demonstrates multiple normal-appearing lymph nodes.  IMPRESSION: 1. There is a 5.6 cm group of suspicious pleomorphic calcifications in the upper-outer quadrant of the right breast.  2. In this area sonographically, there are 2 small suspicious subcentimeter masses which lie at 2 cm apart.  3. No evidence of right axillary lymphadenopathy.  RECOMMENDATION: Ultrasound guided biopsy is recommended for the 2 small masses in the right  breast at 10 o'clock and 11 o'clock. Consider specimen radiograph to ensure calcifications are within the biopsy samples. If no calcifications are seen, and the biopsies are benign, stereotactic biopsy of a region of calcifications is recommended. Stereotactic biopsy may also be indicated if demonstration of extent of disease is necessary.  The procedure has been scheduled for 04/14/2018 at 2:45 p.m.  I have discussed the findings and recommendations with the patient. Results were also provided in writing at the conclusion of the visit. If applicable, a reminder letter will be sent to the patient regarding the next appointment.  BI-RADS CATEGORY 4: Suspicious.   Electronically Signed By: Ammie Ferrier M.D. On: 04/13/2018 16:49     1. PROGNOSTIC INDICATORS Results: IMMUNOHISTOCHEMICAL AND MORPHOMETRIC ANALYSIS PERFORMED MANUALLY Estrogen Receptor: 0%, NEGATIVE Progesterone Receptor: 0%, NEGATIVE COMMENT: The negative hormone receptor study(ies) in this case has an internal positive control. REFERENCE RANGE ESTROGEN RECEPTOR NEGATIVE 0% POSITIVE =>1% REFERENCE RANGE PROGESTERONE RECEPTOR NEGATIVE 0% POSITIVE =>1% All controls stained appropriately Enid Cutter MD Pathologist, Electronic Signature ( Signed 04/18/2018) FINAL DIAGNOSIS Diagnosis 1. 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 Breast, right, needle core biopsy, 11 o'clock mass, coil shaped marker - DUCTAL CARCINOMA IN SITU - SEE COMMENT 1 of 3 FINAL for Arriola, Dorthia ([id number]) Microscopic Comment 1. and 2. Immunohistochemistry was performed on the biopsy to assess for an invasive component (SMM, calponin, and p63). Myoepithelial cells are present supporting the diagnosis of ductal carcinoma in situ; definitive invasion is not identified. Based on the biopsy,  the ductal carcinoma in situ has a comedo pattern, high nuclear  grade and measures 0.4 cm in greatest linear extent. Prognostic markers (ER/PR) are pending and will be reported in an addendum. Dr. Vic Ripper has reviewed the case and agrees with the diagnosis. The results were called to [facility] on April 17, 2018. Thressa Sheller MD Pathologist, Electronic Signature (Case signed 04/17/2018) S  Diagnosis 1. Breast, right, needle core biopsy, retroareolar - DUCTAL CARCINOMA IN SITU WITH CALCIFICATIONS AND NECROSIS, SEE COMMENT. 2. Breast, right, needle core biopsy, UOQ - DUCTAL CARCINOMA IN SITU WITH CALCIFICATIONS AND NECROSIS, SEE COMMENT. Microscopic Comment 1. and 2. The carcinoma appears high grade. Prognostic markers can be ordered upon request. The case was called to [facility] on 04/21/2018. Vicente Males MD Pathologist, Electronic Signature (Case signed 04/21/2018)pecimen Gross and Clinical Information.  The patient is a 68 year old female.   Past Surgical History Tawni Pummel, RN; 04/26/2018 7:20 AM) Hysterectomy (not due to cancer) - Complete  Diagnostic Studies History Tawni Pummel, RN; 04/26/2018 7:20 AM) Colonoscopy 1-5 years ago Mammogram within last year Pap Smear 1-5 years ago  Medication History Tawni Pummel, RN; 04/26/2018 7:20 AM) Medications Reconciled  Social History Tawni Pummel, RN; 04/26/2018 7:20 AM) Caffeine use Coffee. No alcohol use Tobacco use Former smoker.  Family History Tawni Pummel, RN; 04/26/2018 7:20 AM) Arthritis Family Members In General. Breast Cancer Family Members In General. Cerebrovascular Accident Family Members In General. Cervical Cancer Family Members In General. Diabetes Mellitus Mother, Sister. Heart Disease Family Members In General. Hypertension Mother, Sister. Ovarian Cancer Family Members In General. Respiratory Condition Father. Thyroid problems Sister.  Pregnancy / Birth History Tawni Pummel, RN; 04/26/2018  7:20 AM) Age at menarche 33 years. Age of menopause 51-55 Contraceptive History Oral contraceptives. Gravida 4 Maternal age 77-25 Para 33  Other Problems Tawni Pummel, RN; 04/26/2018 7:20 AM) Arthritis Breast Cancer High blood pressure     Review of Systems Sunday Spillers Ledford RN; 04/26/2018 7:20 AM) General Not Present- Appetite Loss, Chills, Fatigue, Fever, Night Sweats, Weight Gain and Weight Loss. Skin Not Present- Change in Wart/Mole, Dryness, Hives, Jaundice, New Lesions, Non-Healing Wounds, Rash and Ulcer. HEENT Present- Earache, Seasonal Allergies and Wears glasses/contact lenses. Not Present- Hearing Loss, Hoarseness, Nose Bleed, Oral Ulcers, Ringing in the Ears, Sinus Pain, Sore Throat, Visual Disturbances and Yellow Eyes. Respiratory Present- Snoring. Not Present- Bloody sputum, Chronic Cough, Difficulty Breathing and Wheezing. Breast Not Present- Breast Mass, Breast Pain, Nipple Discharge and Skin Changes. Cardiovascular Not Present- Chest Pain, Difficulty Breathing Lying Down, Leg Cramps, Palpitations, Rapid Heart Rate, Shortness of Breath and Swelling of Extremities. Gastrointestinal Not Present- Abdominal Pain, Bloating, Bloody Stool, Change in Bowel Habits, Chronic diarrhea, Constipation, Difficulty Swallowing, Excessive gas, Gets full quickly at meals, Hemorrhoids, Indigestion, Nausea, Rectal Pain and Vomiting. Female Genitourinary Not Present- Frequency, Nocturia, Painful Urination, Pelvic Pain and Urgency. Musculoskeletal Present- Joint Stiffness. Not Present- Back Pain, Joint Pain, Muscle Pain, Muscle Weakness and Swelling of Extremities. Neurological Not Present- Decreased Memory, Fainting, Headaches, Numbness, Seizures, Tingling, Tremor, Trouble walking and Weakness. Psychiatric Not Present- Anxiety, Bipolar, Change in Sleep Pattern, Depression, Fearful and Frequent crying. Endocrine Not Present- Cold Intolerance, Excessive Hunger, Hair Changes, Heat  Intolerance, Hot flashes and New Diabetes. Hematology Not Present- Blood Thinners, Easy Bruising, Excessive bleeding, Gland problems, HIV and Persistent Infections.   Physical Exam (Hedi Barkan A. Johnn Krasowski MD; 04/26/2018 2:33 PM)  General Mental Status-Alert. General Appearance-Consistent with stated age. Hydration-Well hydrated. Voice-Normal.  Head and Neck Head-normocephalic, atraumatic with no lesions or palpable masses. Trachea-midline. Thyroid Gland Characteristics - normal size and consistency.  Chest and Lung Exam Chest and lung exam reveals -quiet, even and easy respiratory effort with no use of accessory muscles and on auscultation, normal breath sounds, no adventitious sounds and normal vocal resonance. Inspection Chest Wall - Normal. Back - normal.  Breast Note: RIGH BREAST SWELLING AND BRUISING NOTED LEFT BREAST NORMAL  Cardiovascular Cardiovascular examination reveals -normal heart sounds, regular rate and rhythm with no murmurs and normal pedal pulses bilaterally.  Neurologic Neurologic evaluation reveals -alert and oriented x 3 with no impairment of recent or remote memory. Mental Status-Normal.  Musculoskeletal Normal Exam - Left-Upper Extremity Strength Normal and Lower Extremity Strength Normal. Normal Exam - Right-Upper Extremity Strength Normal and Lower Extremity Strength Normal.  Lymphatic Head & Neck  General Head & Neck Lymphatics: Bilateral - Description - Normal. Axillary  General Axillary Region: Bilateral - Description - Normal. Tenderness - Non Tender.    Assessment & Plan (Avril Busser A. Lailynn Southgate MD; 04/26/2018 2:34 PM)  BREAST NEOPLASM, TIS (DCIS), RIGHT (D05.11) Impression: LARGE AREA BEST SERVED WITH MASTECTOMY WITH RECONSTRUCTION LUMPECTOMY POSSIBLE BUT COSMESIS AN ISSUE SHE WILL THINK THINGS OVER   Discussed treatment options for breast cancer to include breast conservation vs mastectomy with reconstruction. Pt has  decided on mastectomy. Risk include bleeding, infection, flap necrosis, pain, numbness, recurrence, hematoma, other surgery needs. Pt understands and agrees to proceed.  Current Plans You are being scheduled for surgery- Our schedulers will call you.  You should hear from our office's scheduling department within 5 working days about the location, date, and time of surgery. We try to make accommodations for patient's preferences in scheduling surgery, but sometimes the OR schedule or the surgeon's schedule prevents [place] from making those accommodations.  If you have not heard from our office [phone]) in 5 working days, call the office and ask for your surgeon's nurse.  If you have other questions about your diagnosis, plan, or surgery, call the office and ask for your surgeon's nurse.  Pt Education - [facility] Mastectomy HCI

## 2018-04-26 NOTE — Progress Notes (Signed)
[facility] Psychosocial Distress Screening Spiritual Care  Met with [name] in [facility] to introduce [facility] team/resources, reviewing distress screen per protocol.  The patient scored a 3 on the Psychosocial Distress Thermometer which indicates mild distress. Also assessed for distress and other psychosocial needs.   ONCBCN DISTRESS SCREENING 04/26/2018  Screening Type Initial Screening  Distress experienced in past week (1-10) 5  Emotional problem type Nervousness/Anxiety;Adjusting to illness  Referral to support programs Yes   The pt presented to [facility] with her husband. The pt reported that since receiving additional information about her diagnosis and treatment plan, her distress has decreased to between a 2 and 3. The pt shared that her distress remains at a 2 or 3 due to concerns with anticipated changes to appearance and identity if she chooses to get a mastectomy. She expressed feeling well supported by her family and identified cross stitching, painting, and watching TV shows as resources for coping. The pt expressed no current interest in support programs, but she reported that she will reach out if a need arises.  Follow up needed: No.  Doris Cheadle, Counseling Intern [phone]

## 2018-04-26 NOTE — Progress Notes (Addendum)
Radiation Oncology         ([phone]) [phone] ________________________________  Name: [name]        MRN: [id number]  Date of Service: 04/26/2018 DOB: 02-13-1951  VP:[id number], Evie Lacks, MD  Erroll Luna, MD     REFERRING PHYSICIAN: Erroll Luna, MD   DIAGNOSIS: The encounter diagnosis was Ductal carcinoma in situ (DCIS) of right breast.   HISTORY OF PRESENT ILLNESS: [name] is a 68 y.o. female seen in the multidisciplinary breast clinic for a new diagnosis of right breast cancer. The patient was noted to have screening detected calcifications in the right breast. She proceeded to diagnostic imaging that revealed a 5.6 cm group of calcifications. She had ultrasound that showed a 7 x 4 x 2 mm nodule in the breast at 10:00, but no calcifications were seen in that location. A second area at 11:00 measured 6 x 5 x 4 mm. Her axilla was negative for adenopathy. She underwent a biopsy of the 10:00 and 11:00 sites on 04/14/2018 and revealed high grade DCIS, ER/PR negative. She had two additional biopsies of the anterior calcifications on 04/20/2018 of the retroareolar and upper outer quadrant of the right breast and both revealed high grade DCIS. She comes today to discuss treatment recommendations of her cancer.   PREVIOUS RADIATION THERAPY: No   PAST MEDICAL HISTORY:  Past Medical History:  Diagnosis Date  . ANEMIA-NOS 11/07/2007  . HIP PAIN 02/06/2010  . HYPERGLYCEMIA 02/06/2010  . HYPERTENSION 11/07/2007  . OSTEOARTHRITIS 11/07/2007  . URI 08/08/2009       PAST SURGICAL HISTORY: Past Surgical History:  Procedure Laterality Date  . ABDOMINAL HYSTERECTOMY  2010     FAMILY HISTORY:  Family History  Problem Relation Age of Onset  . Cancer Father        mesothelioma  . Diabetes Mother   . Hypertension Mother   . Hypertension Other   . Breast cancer Paternal Aunt   . Colon cancer Neg Hx      SOCIAL HISTORY:  reports that she quit smoking about 45 years ago. She has  never used smokeless tobacco. She reports that she does not drink alcohol or use drugs. The patient is married and lives in [place]. 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 She has three children, and grandchildren she often travels to visit with.   ALLERGIES: Lyrica [pregabalin] and Tramadol   MEDICATIONS:  Current Outpatient Medications  Medication Sig Dispense Refill  . amLODipine (NORVASC) 2.5 MG tablet Take 1 tablet (2.5 mg total) by mouth daily. 90 tablet 3  . cholecalciferol (VITAMIN D) 1000 UNITS tablet Take 1 tablet (1,000 Units total) by mouth daily. 100 tablet 3  . meloxicam (MOBIC) 15 MG tablet Take 1 tablet (15 mg total) by mouth daily. 90 tablet 1  . Multiple Vitamin (MULTIVITAMIN) tablet Take 1 tablet by mouth daily.     No current facility-administered medications for this encounter.      REVIEW OF SYSTEMS: On review of systems, the patient reports that she is doing well overall. She denies any chest pain, shortness of breath, cough, fevers, chills, night sweats, unintended weight changes. She denies any bowel or bladder disturbances, and denies abdominal pain, nausea or vomiting. She denies any new musculoskeletal or joint aches or pains. A complete review of systems is obtained and is otherwise negative.     PHYSICAL EXAM:  Wt Readings from Last 3 Encounters:  04/26/18 134 lb 12.8 oz (61.1 kg)  07/14/17 138 lb (62.6 kg)  06/20/17 140 lb (63.5 kg)   Temp Readings from Last 3 Encounters:  04/26/18 98.2 F (36.8 C) (Oral)  06/20/17 98.4 F (36.9 C) (Oral)  06/02/16 97.6 F (36.4 C)   BP Readings from Last 3 Encounters:  04/26/18 (!) 161/86  07/14/17 132/84  06/20/17 134/82   Pulse Readings from Last 3 Encounters:  04/26/18 82  07/14/17 74  06/20/17 68     In general this is a well appearing African American female in no acute distress. She is alert and oriented x4 and appropriate throughout the examination. HEENT reveals that the patient is  normocephalic, atraumatic. EOMs are intact. Skin is intact without any evidence of gross. Cardiopulmonary assessment is negative for acute distress and she exhibits normal effort. Breast exam is deferred.   ECOG = 0  0 - Asymptomatic (Fully active, able to carry on all predisease activities without restriction)  1 - Symptomatic but completely ambulatory (Restricted in physically strenuous activity but ambulatory and able to carry out work of a light or sedentary nature. For example, light housework, office work)  2 - Symptomatic, <50% in bed during the day (Ambulatory and capable of all self care but unable to carry out any work activities. Up and about more than 50% of waking hours)  3 - Symptomatic, >50% in bed, but not bedbound (Capable of only limited self-care, confined to bed or chair 50% or more of waking hours)  4 - Bedbound (Completely disabled. Cannot carry on any self-care. Totally confined to bed or chair)  5 - Death   Eustace Pen MM, Creech RH, Tormey DC, et al. [phone]). "Toxicity and response criteria of the [facility]". [place] Oncol. 5 (6): 649-55    LABORATORY DATA:  Lab Results  Component Value Date   WBC 5.8 04/26/2018   HGB 12.6 04/26/2018   HCT 39.4 04/26/2018   MCV 87.9 04/26/2018   PLT 317 04/26/2018   Lab Results  Component Value Date   NA 143 04/26/2018   K 3.4 (L) 04/26/2018   CL 105 04/26/2018   CO2 28 04/26/2018   Lab Results  Component Value Date   ALT 13 04/26/2018   AST 17 04/26/2018   ALKPHOS 120 04/26/2018   BILITOT 0.5 04/26/2018      RADIOGRAPHY: Mm Digital Diagnostic Unilat R  Result Date: 04/13/2018 CLINICAL DATA:  Screening recall for right breast calcifications. EXAM: DIGITAL DIAGNOSTIC RIGHT MAMMOGRAM WITH CAD ULTRASOUND RIGHT BREAST COMPARISON:  Previous exam(s). ACR Breast Density Category b: There are scattered areas of fibroglandular density. FINDINGS: In the upper-outer quadrant of the right breast,  anterior depth, there is a 5.6 cm group of pleomorphic calcifications in a segmental distribution. Mammographic images were processed with CAD. Ultrasound of the right breast at 11 o'clock, 3 cm from the nipple demonstrates an ill-defined irregular hypoechoic mass measuring 6 x 5 x 4 mm. Blood flow was seen within the mass on color Doppler imaging. An additional hypoechoic irregular mass, which may be within a duct, is seen at 10 o'clock, 4 cm from the nipple measuring 7 x 2 x 4 mm. Blood flow was also seen within this mass on color Doppler imaging. These 2 masses are approximately 2 cm apart. Ultrasound of the right axilla demonstrates multiple normal-appearing lymph nodes. IMPRESSION: 1. There is a 5.6 cm group of suspicious pleomorphic calcifications in the upper-outer quadrant of the right breast. 2. In this area sonographically, there are 2 small suspicious subcentimeter masses which lie at 2  cm apart. 3.  No evidence of right axillary lymphadenopathy. RECOMMENDATION: Ultrasound guided biopsy is recommended for the 2 small masses in the right breast at 10 o'clock and 11 o'clock. Consider specimen radiograph to ensure calcifications are within the biopsy samples. If no calcifications are seen, and the biopsies are benign, stereotactic biopsy of a region of calcifications is recommended. Stereotactic biopsy may also be indicated if demonstration of extent of disease is necessary. The procedure has been scheduled for 04/14/2018 at 2:45 p.m. I have discussed the findings and recommendations with the patient. Results were also provided in writing at the conclusion of the visit. If applicable, a reminder letter will be sent to the patient regarding the next appointment. BI-RADS CATEGORY  4: Suspicious. Electronically Signed   By: Ammie Ferrier M.D.   On: 04/13/2018 16:49   US Breast Ltd Uni Right Inc Axilla  Result Date: 04/13/2018 CLINICAL DATA:  Screening recall for right breast calcifications. EXAM:  DIGITAL DIAGNOSTIC RIGHT MAMMOGRAM WITH CAD ULTRASOUND RIGHT BREAST COMPARISON:  Previous exam(s). ACR Breast Density Category b: There are scattered areas of fibroglandular density. FINDINGS: In the upper-outer quadrant of the right breast, anterior depth, there is a 5.6 cm group of pleomorphic calcifications in a segmental distribution. Mammographic images were processed with CAD. Ultrasound of the right breast at 11 o'clock, 3 cm from the nipple demonstrates an ill-defined irregular hypoechoic mass measuring 6 x 5 x 4 mm. Blood flow was seen within the mass on color Doppler imaging. An additional hypoechoic irregular mass, which may be within a duct, is seen at 10 o'clock, 4 cm from the nipple measuring 7 x 2 x 4 mm. Blood flow was also seen within this mass on color Doppler imaging. These 2 masses are approximately 2 cm apart. Ultrasound of the right axilla demonstrates multiple normal-appearing lymph nodes. IMPRESSION: 1. There is a 5.6 cm group of suspicious pleomorphic calcifications in the upper-outer quadrant of the right breast. 2. In this area sonographically, there are 2 small suspicious subcentimeter masses which lie at 2 cm apart. 3.  No evidence of right axillary lymphadenopathy. RECOMMENDATION: Ultrasound guided biopsy is recommended for the 2 small masses in the right breast at 10 o'clock and 11 o'clock. Consider specimen radiograph to ensure calcifications are within the biopsy samples. If no calcifications are seen, and the biopsies are benign, stereotactic biopsy of a region of calcifications is recommended. Stereotactic biopsy may also be indicated if demonstration of extent of disease is necessary. The procedure has been scheduled for 04/14/2018 at 2:45 p.m. I have discussed the findings and recommendations with the patient. Results were also provided in writing at the conclusion of the visit. If applicable, a reminder letter will be sent to the patient regarding the next appointment. BI-RADS  CATEGORY  4: Suspicious. Electronically Signed   By: Ammie Ferrier M.D.   On: 04/13/2018 16:49   Mm Clip Placement Right  Result Date: 04/20/2018 CLINICAL DATA:  Status post stereotactic core biopsies of right breast calcifications. EXAM: DIAGNOSTIC RIGHT MAMMOGRAM POST STEREOTACTIC BIOPSY COMPARISON:  Previous exam(s). FINDINGS: Mammographic images were obtained following stereotactic guided biopsy of calcifications in the retroareolar right breast and calcifications in the upper-outer quadrant right breast. Cc and lateral views of the right breast demonstrate ribbon biopsy clip in the expected position retroareolar right breast with associated hematoma. The images also demonstrate X biopsy clip in the expected position upper-outer quadrant right breast. IMPRESSION: Post biopsy mammogram demonstrating biopsy clips in the appropriate locations. Final Assessment:  Post Procedure Mammograms for Marker Placement Electronically Signed   By: Abelardo Diesel M.D.   On: 04/20/2018 12:13   Mm Clip Placement Right  Result Date: 04/14/2018 CLINICAL DATA:  Post ultrasound-guided core needle biopsy of 2 right breast nodules, at 10 and 11 o'clock. EXAM: DIAGNOSTIC RIGHT MAMMOGRAM POST ULTRASOUND BIOPSY COMPARISON:  Previous exam(s). FINDINGS: Mammographic images were obtained following ultrasound guided biopsy of right breast. Two-view mammography demonstrates presence of ribbon shaped marker in coil shaped marker at 10 and 11 o'clock position respectively, in appropriate mammographic position. Per the diagnostic report accommodations, mammographic pictures of the specimens were taken looking for calcifications, however there were no calcifications in either of the specimens. Looking at the position of the clips, the sonographically demonstrated and sampled nodules are located more posteriorly in the breast than the group of calcifications identified mammographically. Therefore, stereotactic core needle biopsy of the  indeterminate calcifications in the right breast upper outer quadrant is still needed. I recommend sampling 2 sites, which are 3.6 cm apart, marked on the craniocaudal view, for determining the extent of the process. IMPRESSION: Successful placement of ribbon and coil shaped markers at the 10 and 11 o'clock right breast masses respectively. The patient is in need of 2 stereotactic core needle biopsies of the right breast calcifications, for which she will be scheduled. These findings and recommendations were discussed with the patient after the procedure. Final Assessment: Post Procedure Mammograms for Marker Placement Electronically Signed   By: Fidela Salisbury M.D.   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 This was found to be concordant by Dr. Abelardo Diesel. Pathology results were discussed with the patient by telephone. The patient reported doing well after the biopsy with tenderness at the site. Post biopsy instructions and care were reviewed and questions were answered. The patient was encouraged to call [facility] for any additional concerns. The patient has a recent diagnosis of right breast cancer and should follow her outlined treatment plan. The patient was referred to [facility] on April 26, 2018 Pathology results reported by Stacie Acres, RN on 04/21/2018. Electronically Signed   By: Abelardo Diesel M.D.   On: 04/21/2018 14:12   Result Date: 04/21/2018 CLINICAL DATA:  Patient has recent diagnosis ductal carcinoma in situ at 2 sites with ultrasound-guided core biopsies. Biopsies of anterior calcifications of right  breast were recommended to assess the extent of disease. EXAM: RIGHT BREAST STEREOTACTIC CORE NEEDLE BIOPSY COMPARISON:  Previous exams. FINDINGS: The patient and I discussed the procedure of stereotactic-guided biopsy including benefits and alternatives. We discussed the high likelihood of a successful procedure. We discussed the risks of the procedure including infection, bleeding, tissue injury, clip migration, and inadequate sampling. Informed written consent was given. The usual time out protocol was performed immediately prior to the procedure. Using sterile technique and 1% Lidocaine as local anesthetic, under stereotactic guidance, a 9 gauge vacuum assisted device was used to perform core needle biopsy of calcifications in the retroareolar right breast using a cranial approach. Specimen radiograph was performed showing inclusion a calcifications of concern. Specimens with calcifications are identified for pathology. Lesion quadrant: Retroareolar At the conclusion  of the procedure, a ribbon tissue marker clip (the patient already has a ribbon clip more posteriorly located from the recent ultrasound-guided core biopsy but we only stock 3 biopsy clip shapes, therefore, we use another ribbon clip.) Was deployed into the biopsy cavity. Follow-up 2-view mammogram was performed and dictated separately. Using sterile technique and 1% Lidocaine as local anesthetic, under stereotactic guidance, a 9 gauge vacuum assisted device was used to perform core needle biopsy of calcifications in the upper-outer quadrant right breast using a cranial approach. Specimen radiograph was performed showing . Specimens with calcifications are identified for pathology. Lesion quadrant: Upper-outer quadrant At the conclusion of the procedure, a X tissue marker clip was deployed into the biopsy cavity. Follow-up 2-view mammogram was performed and dictated separately. IMPRESSION: Stereotactic-guided biopsiesof calcifications in the right  breast. No apparent complications. Electronically Signed: By: Abelardo Diesel M.D. On: 04/20/2018 12:12   Mm Rt Breast Bx W Loc Dev Ea Ad Lesion Img Bx Spec Stereo Guide  Addendum Date: 04/21/2018   ADDENDUM REPORT: 04/21/2018 14:12 ADDENDUM: Pathology revealed HIGH GRADE DUCTAL CARCINOMA IN SITU WITH CALCIFICATIONS AND NECROSIS of the RIGHT breast, both locations, retroareolar and upper outer quadrant. This was found to be concordant by Dr. Abelardo Diesel. Pathology results were discussed with the patient by telephone. The patient reported doing well after the biopsy with tenderness at the site. Post biopsy instructions and care were reviewed and questions were answered. The patient was encouraged to call [facility] for any additional concerns. The patient has a recent diagnosis of right breast cancer and should follow her outlined treatment plan. The patient was referred to [facility] at [facility] on April 26, 2018 Pathology results reported by Stacie Acres, RN on 04/21/2018. Electronically Signed   By: Abelardo Diesel M.D.   On: 04/21/2018 14:12   Result Date: 04/21/2018 CLINICAL DATA:  Patient has recent diagnosis ductal carcinoma in situ at 2 sites with ultrasound-guided core biopsies. Biopsies of anterior calcifications of right breast were recommended to assess the extent of disease. EXAM: RIGHT BREAST STEREOTACTIC CORE NEEDLE BIOPSY COMPARISON:  Previous exams. FINDINGS: The patient and I discussed the procedure of stereotactic-guided biopsy including benefits and alternatives. We discussed the high likelihood of a successful procedure. We discussed the risks of the procedure including infection, bleeding, tissue injury, clip migration, and inadequate sampling. Informed written consent was given. The usual time out protocol was performed immediately prior to the procedure. Using sterile technique and 1% Lidocaine as  local anesthetic, under stereotactic guidance, a 9 gauge vacuum assisted device was used to perform core needle biopsy of calcifications in the retroareolar right breast using a cranial approach. Specimen radiograph was performed showing inclusion a calcifications of concern. Specimens with calcifications are identified for pathology. Lesion quadrant: Retroareolar At the conclusion of the procedure, a ribbon tissue marker clip (the patient already has a ribbon clip more posteriorly located from the recent ultrasound-guided core biopsy but we only stock 3 biopsy clip shapes, therefore, we use another ribbon clip.) Was deployed into the biopsy cavity. Follow-up 2-view mammogram was performed and dictated separately. Using sterile technique and 1% Lidocaine as local anesthetic, under stereotactic guidance, a 9 gauge vacuum assisted device was used to perform core needle biopsy of calcifications in the upper-outer quadrant right breast using a cranial approach. Specimen radiograph was performed showing . Specimens with calcifications are identified for pathology. Lesion quadrant: Upper-outer quadrant At the conclusion  of the procedure, a X tissue marker clip was deployed into the biopsy cavity. Follow-up 2-view mammogram was performed and dictated separately. IMPRESSION: Stereotactic-guided biopsiesof calcifications in the right breast. No apparent complications. Electronically Signed: By: Abelardo Diesel M.D. 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 This was found to be concordant by Dr. Fidela Salisbury. Pathology results were discussed with the patient by telephone. The patient reported doing well after the biopsies with tenderness at the sites. Post biopsy instructions and care were  reviewed and questions were answered. The patient was encouraged to call [facility] for any additional concerns. The patient was referred to [facility] on February, 26, 2020 She is scheduled for stereotactic biopsies of the RIGHT breast on April 20, 2018. Pathology results reported by Stacie Acres, RN on 04/18/2018. Electronically Signed   By: Fidela Salisbury M.D.   On: 04/18/2018 12:35   Result Date: 04/20/2018 CLINICAL DATA:  Right breast 10 o'clock and 11 o'clock small masses within an area of calcifications. EXAM: ULTRASOUND GUIDED RIGHT BREAST CORE NEEDLE BIOPSY COMPARISON:  Previous exam(s). FINDINGS: I met with the patient and we discussed the procedure of ultrasound-guided biopsy, including benefits and alternatives. We discussed the high likelihood of a successful procedure. We discussed the risks of the procedure, including infection, bleeding, tissue injury, clip migration, and inadequate sampling. Informed written consent was given. The usual time-out protocol was performed immediately prior to the procedure. Lesion quadrant: Upper outer quadrant Using sterile technique and 1% Lidocaine as local anesthetic, under direct ultrasound visualization, a 14 gauge spring-loaded device was used to perform biopsy of right breast 10 o'clock mass using a inferior approach. At the conclusion of the procedure a ribbon shaped tissue marker clip was deployed into the biopsy cavity. Next, using sterile technique and 1% Lidocaine as local anesthetic, under direct ultrasound visualization, a 14 gauge spring-loaded device was used to perform biopsy of right breast 11 o'clock mass using a inferior approach. At the conclusion of the procedure a coil shaped tissue marker clip was deployed into the biopsy cavity. Follow up 2 view mammogram was performed and dictated separately. IMPRESSION: Ultrasound guided biopsy of  the right breast. No apparent complications. Electronically Signed: By: Fidela Salisbury M.D. 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 This was found to be concordant by Dr. Fidela Salisbury. Pathology results were discussed with the patient by telephone. The patient reported doing well after the biopsies with tenderness at the sites. Post biopsy instructions and care were reviewed and questions were answered. The patient was encouraged to call [facility] [place] for any additional concerns. The patient was referred to [facility] on February, 26, 2020 She is scheduled for stereotactic biopsies of the RIGHT breast on April 20, 2018. Pathology results reported by Stacie Acres, RN on 04/18/2018. Electronically Signed   By: Linwood Dibbles.D.  On: 04/18/2018 12:35   Result Date: 04/20/2018 CLINICAL DATA:  Right breast 10 o'clock and 11 o'clock small masses within an area of calcifications. EXAM: ULTRASOUND GUIDED RIGHT BREAST CORE NEEDLE BIOPSY COMPARISON:  Previous exam(s). FINDINGS: I met with the patient and we discussed the procedure of ultrasound-guided biopsy, including benefits and alternatives. We discussed the high likelihood of a successful procedure. We discussed the risks of the procedure, including infection, bleeding, tissue injury, clip migration, and inadequate sampling. Informed written consent was given. The usual time-out protocol was performed immediately prior to the procedure. Lesion quadrant: Upper outer quadrant Using sterile technique and 1% Lidocaine as local anesthetic, under direct ultrasound visualization, a 14 gauge spring-loaded device was used to  perform biopsy of right breast 10 o'clock mass using a inferior approach. At the conclusion of the procedure a ribbon shaped tissue marker clip was deployed into the biopsy cavity. Next, using sterile technique and 1% Lidocaine as local anesthetic, under direct ultrasound visualization, a 14 gauge spring-loaded device was used to perform biopsy of right breast 11 o'clock mass using a inferior approach. At the conclusion of the procedure a coil shaped tissue marker clip was deployed into the biopsy cavity. Follow up 2 view mammogram was performed and dictated separately. IMPRESSION: Ultrasound guided biopsy of the right breast. No apparent complications. Electronically Signed: By: Fidela Salisbury M.D. On: 04/14/2018 15:41       IMPRESSION/PLAN: 1. Multifocal High Grade ER negative DCIS of the right breast. Dr. Lisbeth Renshaw discusses the pathology findings and reviews the nature of noninvasive breast disease. The consensus from the breast conference includes mastectomy with the option for reconstruction. We briefly discussed radiotherapy in the setting of lumpectomy. The patient will meet with Dr. Brantley Stage to review surgical options. Dr. Lisbeth Renshaw did discuss that if she opted to have breast conservation, she would benefit from radiotherapy. He outlined the risks, benefits, short, and long term effects of radiotherapy, and the patient is interested in proceeding. Dr. Lisbeth Renshaw discusses the delivery and logistics of radiotherapy and anticipates a course of 4 or 6 1/2 weeks of radiotherapy, and anticipating 4 weeks again if she underwent breast conserving surgery. We will see her back depending on the surgical decision making.   In a visit lasting 60 minutes, greater than 50% of the time was spent face to face discussing her case, and coordinating the patient's care.  The above documentation reflects my direct findings during this shared patient visit. Please see the separate note by Dr. Lisbeth Renshaw on this date for the  remainder of the patient's plan of care.    Carola Rhine, PAC

## 2018-04-26 NOTE — H&P (View-Only) (Signed)
Leonette Nutting Documented: 04/26/2018 7:19 AM Location: [facility] Patient #: [id number] DOB: 1950/09/16 Undefined / Language: Suszanne Conners / Race: Black or African American Female  History of Present Illness Marcello Moores A. Arra Connaughton MD; 04/26/2018 2:33 PM) Patient words: Pt sent at the request of Dr Gaetano Net for SD mammographic abnormality of the right breast. Area of 5.6 cm of calcifications noted and core bx shows DCIS. No history of mass lesion nipple discharge or pain.                       Screening recall for right breast calcifications.  EXAM: DIGITAL DIAGNOSTIC RIGHT MAMMOGRAM WITH CAD  ULTRASOUND RIGHT BREAST  COMPARISON: Previous exam(s).  ACR Breast Density Category b: There are scattered areas of fibroglandular density.  FINDINGS: In the upper-outer quadrant of the right breast, anterior depth, there is a 5.6 cm group of pleomorphic calcifications in a segmental distribution.  Mammographic images were processed with CAD.  Ultrasound of the right breast at 11 o'clock, 3 cm from the nipple demonstrates an ill-defined irregular hypoechoic mass measuring 6 x 5 x 4 mm. Blood flow was seen within the mass on color Doppler imaging. An additional hypoechoic irregular mass, which may be within a duct, is seen at 10 o'clock, 4 cm from the nipple measuring 7 x 2 x 4 mm. Blood flow was also seen within this mass on color Doppler imaging. These 2 masses are approximately 2 cm apart. Ultrasound of the right axilla demonstrates multiple normal-appearing lymph nodes.  IMPRESSION: 1. There is a 5.6 cm group of suspicious pleomorphic calcifications in the upper-outer quadrant of the right breast.  2. In this area sonographically, there are 2 small suspicious subcentimeter masses which lie at 2 cm apart.  3. No evidence of right axillary lymphadenopathy.  RECOMMENDATION: Ultrasound guided biopsy is recommended for the 2 small masses in the right  breast at 10 o'clock and 11 o'clock. Consider specimen radiograph to ensure calcifications are within the biopsy samples. If no calcifications are seen, and the biopsies are benign, stereotactic biopsy of a region of calcifications is recommended. Stereotactic biopsy may also be indicated if demonstration of extent of disease is necessary.  The procedure has been scheduled for 04/14/2018 at 2:45 p.m.  I have discussed the findings and recommendations with the patient. Results were also provided in writing at the conclusion of the visit. If applicable, a reminder letter will be sent to the patient regarding the next appointment.  BI-RADS CATEGORY 4: Suspicious.   Electronically Signed By: Ammie Ferrier M.D. On: 04/13/2018 16:49     1. PROGNOSTIC INDICATORS Results: IMMUNOHISTOCHEMICAL AND MORPHOMETRIC ANALYSIS PERFORMED MANUALLY Estrogen Receptor: 0%, NEGATIVE Progesterone Receptor: 0%, NEGATIVE COMMENT: The negative hormone receptor study(ies) in this case has an internal positive control. REFERENCE RANGE ESTROGEN RECEPTOR NEGATIVE 0% POSITIVE =>1% REFERENCE RANGE PROGESTERONE RECEPTOR NEGATIVE 0% POSITIVE =>1% All controls stained appropriately Enid Cutter MD Pathologist, Electronic Signature ( Signed 04/18/2018) FINAL DIAGNOSIS Diagnosis 1. 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 and 2. Immunohistochemistry was performed on the biopsy to assess for an invasive component (SMM, calponin, and p63). Myoepithelial cells are present supporting the diagnosis of ductal carcinoma in situ; definitive invasion is not identified. Based on the biopsy,  the ductal carcinoma in situ has a comedo pattern, high nuclear  grade and measures 0.4 cm in greatest linear extent. Prognostic markers (ER/PR) are pending and will be reported in an addendum. Dr. Vic Ripper has reviewed the case and agrees with the diagnosis. The results were called to [facility] on April 17, 2018. Thressa Sheller MD Pathologist, Electronic Signature (Case signed 04/17/2018) S  Diagnosis 1. Breast, right, needle core biopsy, retroareolar - DUCTAL CARCINOMA IN SITU WITH CALCIFICATIONS AND NECROSIS, SEE COMMENT. 2. Breast, right, needle core biopsy, UOQ - DUCTAL CARCINOMA IN SITU WITH CALCIFICATIONS AND NECROSIS, SEE COMMENT. Microscopic Comment 1. and 2. The carcinoma appears high grade. Prognostic markers can be ordered upon request. The case was called to [facility] on 04/21/2018. Vicente Males MD Pathologist, Electronic Signature (Case signed 04/21/2018)pecimen Gross and Clinical Information.  The patient is a 68 year old female.   Past Surgical History Tawni Pummel, RN; 04/26/2018 7:20 AM) Hysterectomy (not due to cancer) - Complete  Diagnostic Studies History Tawni Pummel, RN; 04/26/2018 7:20 AM) Colonoscopy 1-5 years ago Mammogram within last year Pap Smear 1-5 years ago  Medication History Tawni Pummel, RN; 04/26/2018 7:20 AM) Medications Reconciled  Social History Tawni Pummel, RN; 04/26/2018 7:20 AM) Caffeine use Coffee. No alcohol use Tobacco use Former smoker.  Family History Tawni Pummel, RN; 04/26/2018 7:20 AM) Arthritis Family Members In General. Breast Cancer Family Members In General. Cerebrovascular Accident Family Members In General. Cervical Cancer Family Members In General. Diabetes Mellitus Mother, Sister. Heart Disease Family Members In General. Hypertension Mother, Sister. Ovarian Cancer Family Members In General. Respiratory Condition Father. Thyroid problems Sister.  Pregnancy / Birth History Tawni Pummel, RN; 04/26/2018  7:20 AM) Age at menarche 89 years. Age of menopause 51-55 Contraceptive History Oral contraceptives. Gravida 4 Maternal age 54-25 Para 47  Other Problems Tawni Pummel, RN; 04/26/2018 7:20 AM) Arthritis Breast Cancer High blood pressure     Review of Systems Sunday Spillers Ledford RN; 04/26/2018 7:20 AM) General Not Present- Appetite Loss, Chills, Fatigue, Fever, Night Sweats, Weight Gain and Weight Loss. Skin Not Present- Change in Wart/Mole, Dryness, Hives, Jaundice, New Lesions, Non-Healing Wounds, Rash and Ulcer. HEENT Present- Earache, Seasonal Allergies and Wears glasses/contact lenses. Not Present- Hearing Loss, Hoarseness, Nose Bleed, Oral Ulcers, Ringing in the Ears, Sinus Pain, Sore Throat, Visual Disturbances and Yellow Eyes. Respiratory Present- Snoring. Not Present- Bloody sputum, Chronic Cough, Difficulty Breathing and Wheezing. Breast Not Present- Breast Mass, Breast Pain, Nipple Discharge and Skin Changes. Cardiovascular Not Present- Chest Pain, Difficulty Breathing Lying Down, Leg Cramps, Palpitations, Rapid Heart Rate, Shortness of Breath and Swelling of Extremities. Gastrointestinal Not Present- Abdominal Pain, Bloating, Bloody Stool, Change in Bowel Habits, Chronic diarrhea, Constipation, Difficulty Swallowing, Excessive gas, Gets full quickly at meals, Hemorrhoids, Indigestion, Nausea, Rectal Pain and Vomiting. Female Genitourinary Not Present- Frequency, Nocturia, Painful Urination, Pelvic Pain and Urgency. Musculoskeletal Present- Joint Stiffness. Not Present- Back Pain, Joint Pain, Muscle Pain, Muscle Weakness and Swelling of Extremities. Neurological Not Present- Decreased Memory, Fainting, Headaches, Numbness, Seizures, Tingling, Tremor, Trouble walking and Weakness. Psychiatric Not Present- Anxiety, Bipolar, Change in Sleep Pattern, Depression, Fearful and Frequent crying. Endocrine Not Present- Cold Intolerance, Excessive Hunger, Hair Changes, Heat  Intolerance, Hot flashes and New Diabetes. Hematology Not Present- Blood Thinners, Easy Bruising, Excessive bleeding, Gland problems, HIV and Persistent Infections.   Physical Exam (Husain Costabile A. Nelson Noone MD; 04/26/2018 2:33 PM)  General Mental Status-Alert. General Appearance-Consistent with stated age. Hydration-Well hydrated. Voice-Normal.  Head and Neck Head-normocephalic, atraumatic with no lesions or palpable masses. Trachea-midline. Thyroid Gland Characteristics - normal size and consistency.  Chest and Lung Exam Chest and lung exam reveals -quiet, even and easy respiratory effort with no use of accessory muscles and on auscultation, normal breath sounds, no adventitious sounds and normal vocal resonance. Inspection Chest Wall - Normal. Back - normal.  Breast Note: RIGH BREAST SWELLING AND BRUISING NOTED LEFT BREAST NORMAL  Cardiovascular Cardiovascular examination reveals -normal heart sounds, regular rate and rhythm with no murmurs and normal pedal pulses bilaterally.  Neurologic Neurologic evaluation reveals -alert and oriented x 3 with no impairment of recent or remote memory. Mental Status-Normal.  Musculoskeletal Normal Exam - Left-Upper Extremity Strength Normal and Lower Extremity Strength Normal. Normal Exam - Right-Upper Extremity Strength Normal and Lower Extremity Strength Normal.  Lymphatic Head & Neck  General Head & Neck Lymphatics: Bilateral - Description - Normal. Axillary  General Axillary Region: Bilateral - Description - Normal. Tenderness - Non Tender.    Assessment & Plan (Kysa Calais A. Pristine Gladhill MD; 04/26/2018 2:34 PM)  BREAST NEOPLASM, TIS (DCIS), RIGHT (D05.11) Impression: LARGE AREA BEST SERVED WITH MASTECTOMY WITH RECONSTRUCTION LUMPECTOMY POSSIBLE BUT COSMESIS AN ISSUE SHE WILL THINK THINGS OVER   Discussed treatment options for breast cancer to include breast conservation vs mastectomy with reconstruction. Pt has  decided on mastectomy. Risk include bleeding, infection, flap necrosis, pain, numbness, recurrence, hematoma, other surgery needs. Pt understands and agrees to proceed.  Current Plans You are being scheduled for surgery- Our schedulers will call you.  You should hear from our office's scheduling department within 5 working days about the location, date, and time of surgery. We try to make accommodations for patient's preferences in scheduling surgery, but sometimes the OR schedule or the surgeon's schedule prevents [place] from making those accommodations.  If you have not heard from our office [phone]) in 5 working days, call the office and ask for your surgeon's nurse.  If you have other questions about your diagnosis, plan, or surgery, call the office and ask for your surgeon's nurse.  Pt Education - [facility] Mastectomy [facility]CI

## 2018-04-27 ENCOUNTER — Encounter: Payer: Self-pay | Admitting: Genetics

## 2018-04-27 DIAGNOSIS — Z803 Family history of malignant neoplasm of breast: Secondary | ICD-10-CM | POA: Insufficient documentation

## 2018-04-27 DIAGNOSIS — Z8041 Family history of malignant neoplasm of ovary: Secondary | ICD-10-CM | POA: Insufficient documentation

## 2018-04-27 NOTE — Progress Notes (Signed)
REFERRING PROVIDER: Chauncey Cruel, MD [address] [place], [place] [address]  PRIMARY PROVIDER:  Plotnikov, Evie Lacks, MD  PRIMARY REASON FOR VISIT:  1. Ductal carcinoma in situ (DCIS) of right breast   2. Family history of breast cancer   3. Family history of ovarian cancer    HISTORY OF PRESENT ILLNESS:   [name], a 68 y.o. female, was seen for a  cancer genetics consultation at the request of Dr. Jana Hakim due to a personal and family history of cancer.  Ms. Daus presents to clinic today to discuss the possibility of a hereditary predisposition to cancer, genetic testing, and to further clarify her future cancer risks, as well as potential cancer risks for family members.   In Feb 2020, at the age of 36, Ms. Waskey was diagnosed with ductal carcinoma in situ, ER-, PR-. She is currently considering treatment options at this time.   HORMONAL RISK FACTORS:  Menarche was at age 69.  First live birth at age 40.  Ovaries intact: no.  Hysterectomy: yes.  Menopausal status: postmenopausal.  HRT use: 0 years.  Past Medical History:  Diagnosis Date  . ANEMIA-NOS 11/07/2007  . Family history of breast cancer   . Family history of ovarian cancer   . HIP PAIN 02/06/2010  . HYPERGLYCEMIA 02/06/2010  . HYPERTENSION 11/07/2007  . OSTEOARTHRITIS 11/07/2007  . URI 08/08/2009    Past Surgical History:  Procedure Laterality Date  . VAGINAL HYSTERECTOMY  03/12/2008   with BSO    Social History   Socioeconomic History  . Marital status: Married    Spouse name: Carlis Abbott  . Number of children: 3  . Years of education: Not on file  . Highest education level: Not on file  Occupational History  . Not on file  Social Needs  . Financial resource strain: Not on file  . Food insecurity:    Worry: Not on file    Inability: Not on file  . Transportation needs:    Medical: Not on file    Non-medical: Not on file  Tobacco Use  . Smoking status: Former Smoker    Last  attempt to quit: 03/01/1973    Years since quitting: 45.1  . Smokeless tobacco: Never Used  Substance and Sexual Activity  . Alcohol use: No  . Drug use: No  . Sexual activity: Yes  Lifestyle  . Physical activity:    Days per week: Not on file    Minutes per session: Not on file  . Stress: Not on file  Relationships  . Social connections:    Talks on phone: Not on file    Gets together: Not on file    Attends religious service: Not on file    Active member of club or organization: Not on file    Attends meetings of clubs or organizations: Not on file    Relationship status: Not on file  Other Topics Concern  . Not on file  Social History Narrative  . Not on file     FAMILY HISTORY:  We obtained a detailed, 4-generation family history.  Significant diagnoses are listed below: Family History  Problem Relation Age of Onset  . Cancer Father        mesothelioma  . Diabetes Mother   . Hypertension Mother   . Hypertension Other   . Breast cancer Paternal Aunt 87  . Ovarian cancer Maternal Aunt        late 49s  . Lupus Daughter   .  Colon cancer Neg Hx   . Prostate cancer Neg Hx     [name] has 2 sons and a daughter with no hx of cancer. She has 7 paternal half-brothers and 2 paternal half-sisters.  She has 3 maternal half-siters and 1 maternal half-brother all with no hx of cancer known.   Ms. Bare father: died with  Mesothelioma at 34.  Paternal aunts/Uncles: 4 paternal aunts/uncles. 1 had breast cancer dx at 50 she is now 17.  NO info known about the other aunts.  Paternal cousins: 1 cousin had leukemia.  Paternal grandfather: no known cancer hx  Paternal grandmother:no known cancer hx  [name]: 65, no hx of cancer.  Maternal Aunts/Uncles: 2 maternal aunts and 1 maternal uncle. 1 maternal aunt had ovarian cancer at 45 and is deceased.  The uncle had kidney disease, the other aunt had a stroke.  Maternal cousins: no known hx of cancer.  Maternal  grandfather: no hx of cancer Maternal grandmother:no hx of cancer  [name] is unaware of previous family history of genetic testing for hereditary cancer risks. Patient's maternal ancestors are of African American descent, and paternal ancestors are of African [place] descent. There is no reported Ashkenazi Jewish ancestry. There is no known consanguinity.  GENETIC COUNSELING ASSESSMENT: Hayven Murray is a 68 y.o. female with a personal and family history which is somewhat suggestive of a Hereditary Cancer Predisposition Syndrome. We, therefore, discussed and recommended the following at today's visit.   DISCUSSION: We reviewed the characteristics, features and inheritance patterns of hereditary cancer syndromes. We also discussed genetic testing, including the appropriate family members to test, the process of testing, insurance coverage and turn-around-time for results. We discussed the implications of a negative, positive and/or variant of uncertain significant result. In order to get genetic test results in a timely manner so that [name] can use these genetic test results for surgical decisions, we recommended [name] pursue genetic testing for the Breast Cancer STAT panel. We then recommend [name] pursue reflex genetic testing to the Common Hereditary Cancers gene panel.   The STAT Breast cancer panel offered by [facility] includes sequencing and rearrangement analysis for the following 9 genes:  ATM, BRCA1, BRCA2, CDH1, CHEK2, PALB2, PTEN, STK11 and TP53.    The Common Hereditary Cancer Panel offered by [facility] includes sequencing and/or deletion duplication testing of the following 47 genes: APC, ATM, AXIN2, BARD1, BMPR1A, BRCA1, BRCA2, BRIP1, CDH1, CDKN2A (p14ARF), CDKN2A (p16INK4a), CKD4, CHEK2, CTNNA1, DICER1, EPCAM (Deletion/duplication testing only), GREM1 (promoter region deletion/duplication testing only), KIT, MEN1, MLH1, MSH2, MSH3, MSH6, MUTYH, NBN, NF1, NHTL1, PALB2,  PDGFRA, PMS2, POLD1, POLE, PTEN, RAD50, RAD51C, RAD51D, SDHB, SDHC, SDHD, SMAD4, SMARCA4. STK11, TP53, TSC1, TSC2, and VHL.  The following genes were evaluated for sequence changes only: SDHA and HOXB13 c.251G>A variant only.   We discussed that only 5-10% of cancers are associated with a Hereditary cancer predisposition syndrome.  One of the most common hereditary cancer syndromes that increases breast cancer risk is called Hereditary Breast and Ovarian Cancer (HBOC) syndrome.  This syndrome is caused by mutations in the BRCA1 and BRCA2 genes.  This syndrome increases an individual's lifetime risk to develop breast, ovarian, pancreatic, and other types of cancer.  There are also many other cancer predisposition syndromes caused by mutations in several other genes.  We discussed that if she is found to have a mutation in one of these genes, it may impact surgical decisions, and alter future medical management recommendations  such as increased cancer screenings and consideration of risk reducing surgeries.  A positive result could also have implications for the patient's family members.  A Negative result would mean we were unable to identify a hereditary component to her cancer, but does not rule out the possibility of a hereditary basis for her cancer.  There could be mutations that are undetectable by current technology, or in genes not yet tested or identified to increase cancer risk.    We discussed the potential to find a Variant of Uncertain Significance or VUS.  These are variants that have not yet been identified as pathogenic or benign, and it is unknown if this variant is associated with increased cancer risk or if this is a normal finding.  Most VUS's are reclassified to benign or likely benign.   It should not be used to make medical management decisions. With time, we suspect the lab will determine the significance of any VUS's identified if any.   Based on Ms. Carithers's personal and family  history of cancer, she meets medical criteria for genetic testing. Despite that she meets criteria, she may still have an out of pocket cost. The laboratory can provide her with an estimate of her OOP cost. she was given the contact information of the laboratory if she has further questions.   PLAN: After considering the risks, benefits, and limitations, Ms. Shindler  provided informed consent to pursue genetic testing and the saliva sample was sent to [facility] for analysis of the Breast Cancer STAT panel with plans to reflex to the Common Hereditary Cancers Panel. Preliminary results should be available within approximately 5-12 days' time, at which point they will be disclosed by telephone to Ms. Basu, as will any additional recommendations warranted by these results. Ms. Denapoli will receive a summary of her genetic counseling visit and a copy of her results once available. This information will also be available in Epic. We encouraged Ms. Vandenheuvel to remain in contact with cancer genetics annually so that we can continuously update the family history and inform her of any changes in cancer genetics and testing that may be of benefit for her family. Ms. Hermida questions were answered to her satisfaction today. Our contact information was provided should additional questions or concerns arise.  Based on Ms. Leffler's family history, we recommended her maternal relatives also, have genetic counseling and testing. Ms. Peppel will let us know if we can be of any assistance in coordinating genetic counseling and/or testing for this family member.   Lastly, we encouraged Ms. Benedetti to remain in contact with cancer genetics annually so that we can continuously update the family history and inform her of any changes in cancer genetics and testing that may be of benefit for this family.   Ms.  Mccumbers questions were answered to her satisfaction today. Our contact information was provided should  additional questions or concerns arise. Thank you for the referral and allowing [place] to share in the care of your patient.   Tana Felts, MS, [facility] Certified Genetic Counselor [email or web address] phone: [phone]  The patient was seen for a total of 15 minutes in face-to-face genetic counseling.  The patient was accompanied today by her husband.   This patient was discussed with Drs. Magrinat, Lindi Adie and/or Burr Medico who agrees with the above.

## 2018-05-02 ENCOUNTER — Telehealth: Payer: Self-pay | Admitting: *Deleted

## 2018-05-02 NOTE — Telephone Encounter (Signed)
  Oncology Nurse Navigator Documentation  Navigator Location: [facility] (05/02/18 1100)   )Navigator Encounter Type: Telephone;[facility] Follow-up (05/02/18 1100) Telephone: Outgoing Call;Clinic/[facility] Follow-up (05/02/18 1100)       Genetic Counseling Date: 04/26/18 (05/02/18 1100) Genetic Counseling Type: Urgent (05/02/18 1100)                                        Time Spent with Patient: 15 (05/02/18 1100)

## 2018-05-08 ENCOUNTER — Encounter: Payer: Self-pay | Admitting: Licensed Clinical Social Worker

## 2018-05-08 ENCOUNTER — Ambulatory Visit: Payer: Self-pay | Admitting: Licensed Clinical Social Worker

## 2018-05-08 ENCOUNTER — Telehealth: Payer: Self-pay | Admitting: Licensed Clinical Social Worker

## 2018-05-08 DIAGNOSIS — Z1379 Encounter for other screening for genetic and chromosomal anomalies: Secondary | ICD-10-CM | POA: Insufficient documentation

## 2018-05-08 NOTE — Telephone Encounter (Signed)
Revealed negative genetic testing. This normal result is reassuring and indicates that it is unlikely [name]'s cancer is due to a hereditary cause.  It is unlikely that there is an increased risk of another cancer due to a mutation in one of these genes.  However, genetic testing is not perfect, and cannot definitively rule out a hereditary cause.  It will be important for her to keep in contact with genetics to learn if any additional testing may be needed in the future.  Her maternal relatives could consider genetic counseling/testing.

## 2018-05-08 NOTE — Progress Notes (Signed)
HPI:  [name] was previously seen in the [facility] due to a personal and family history of cancer and concerns regarding a hereditary predisposition to cancer. Please refer to our prior cancer genetics clinic note for more information regarding our discussion, assessment and recommendations, at the time. [name] recent genetic test results were disclosed to her, as were recommendations warranted by these results. These results and recommendations are discussed in more detail below.  In Feb 2020, at the age of 68, 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 She is currently considering treatment options at this time.   CANCER HISTORY:   No history exists.    FAMILY HISTORY:  We obtained a detailed, 4-generation family history.  Significant diagnoses are listed below: Family History  Problem Relation Age of Onset  . Cancer Father        mesothelioma  . Diabetes Mother   . Hypertension Mother   . Hypertension Other   . Breast cancer Paternal Aunt 68  . Ovarian cancer Maternal Aunt        late 31s  . Lupus Daughter   . Colon cancer Neg Hx   . Prostate cancer Neg Hx     [name] has 2 sons and a daughter with no hx of cancer. She has 7 paternal half-brothers and 2 paternal half-sisters.  She has 3 maternal half-siters and 1 maternal half-brother all with no hx of cancer known.   [name] father: died with  Mesothelioma at 33.  Paternal aunts/Uncles: 4 paternal aunts/uncles. 1 had breast cancer dx at 50 she is now 70.  NO info known about the other aunts.  Paternal cousins: 1 cousin had leukemia.  Paternal grandfather: no known cancer hx  Paternal grandmother:no known cancer hx  [name]'s mother: 52, no hx of cancer.  Maternal Aunts/Uncles: 2 maternal aunts and 1 maternal uncle. 1 maternal aunt had ovarian cancer at 80 and is deceased.  The uncle had kidney disease, the other aunt had a stroke.  Maternal cousins: no  known hx of cancer.  Maternal grandfather: no hx of cancer Maternal grandmother:no hx of cancer  [name] is unaware of previous family history of genetic testing for hereditary cancer risks. Patient's maternal ancestors are of African American descent, and paternal ancestors are of African [place] descent. There is no reported Ashkenazi Jewish ancestry. There is no known consanguinity.  GENETIC TEST RESULTS: Genetic testing reported out on 05/08/2018 through the [facility] Breast Cancer STAT Panel + Common Hereditary cancer panel found no pathogenic mutations. The STAT Breast cancer panel offered by [facility] includes sequencing and rearrangement analysis for the following 9 genes:  ATM, BRCA1, BRCA2, CDH1, CHEK2, PALB2, PTEN, STK11 and TP53.  The Common Hereditary Cancers Panel offered by [facility] includes sequencing and/or deletion duplication testing of the following 47 genes: APC, ATM, AXIN2, BARD1, BMPR1A, BRCA1, BRCA2, BRIP1, CDH1, CDKN2A (p14ARF), CDKN2A (p16INK4a), CKD4, CHEK2, CTNNA1, DICER1, EPCAM (Deletion/duplication testing only), GREM1 (promoter region deletion/duplication testing only), KIT, MEN1, MLH1, MSH2, MSH3, MSH6, MUTYH, NBN, NF1, NHTL1, PALB2, PDGFRA, PMS2, POLD1, POLE, PTEN, RAD50, RAD51C, RAD51D, SDHB, SDHC, SDHD, SMAD4, SMARCA4. STK11, TP53, TSC1, TSC2, and VHL.  The following genes were evaluated for sequence changes only: SDHA and HOXB13 c.251G>A variant only.  The test report has been scanned into EPIC and is located under the Molecular Pathology section of the Results Review tab.  A portion of the result report is included below for reference.  We discussed with [name] that because current genetic testing is not perfect, it is possible there may be a gene mutation in one of these genes that current testing cannot detect, but that chance is small.  We also discussed, that there could be another gene that has not yet been discovered, or that we have not yet tested, that  is responsible for the cancer diagnoses in the family. It is also possible there is a hereditary cause for the cancer in the family that [name] did not inherit and therefore was not identified in her testing.  Therefore, it is important to remain in touch with cancer genetics in the future so that we can continue to offer [name] the most up to date genetic testing.   ADDITIONAL GENETIC TESTING: We discussed with [name] that her genetic testing was fairly extensive.  If there are genes identified to increase cancer risk that can be analyzed in the future, we would be happy to discuss and coordinate this testing at that time.    CANCER SCREENING RECOMMENDATIONS: [name] test result is considered negative (normal).  This means that we have not identified a hereditary cause for her  personal and family history of cancer at this time. Most cancers happen by chance and this negative test suggests that her cancer may fall into this category.    While reassuring, this does not definitively rule out a hereditary predisposition to cancer. It is still possible that there could be genetic mutations that are undetectable by current technology. There could be genetic mutations in genes that have not been tested or identified to increase cancer risk.  Therefore, it is recommended she continue to follow the cancer management and screening guidelines provided by her oncology and primary healthcare provider.   An individual's cancer risk and medical management are not determined by genetic test results alone. Overall cancer risk assessment incorporates additional factors, including personal medical history, family history, and any available genetic information that may result in a personalized plan for cancer prevention and surveillance  RECOMMENDATIONS FOR FAMILY MEMBERS:  Women in this family might be at some increased risk of developing cancer, over the general population risk, simply due to the  family history of cancer.  We recommended women in this family have a yearly mammogram beginning at age 103, or 54 years younger than the earliest onset of cancer, an annual clinical breast exam, and perform monthly breast self-exams. Women in this family should also have a gynecological exam as recommended by their primary provider. All family members should have a colonoscopy by age 60.   It is also possible there is a hereditary cause for the cancer in [name]'s family that she did not inherit and therefore was not identified in her.  Based on [name]'s family history, we recommended her  Maternal siblings/cousins have genetic counseling and testing given the family history of ovarian cancer. [name] will let us know if we can be of any assistance in coordinating genetic counseling and/or testing for this family member.   FOLLOW-UP: Lastly, we discussed with [name] that cancer genetics is a rapidly advancing field and it is possible that new genetic tests will be appropriate for her and/or her family members in the future. We encouraged her to remain in contact with cancer genetics on an annual basis so we can update her personal and family histories and let her know of advances in cancer genetics that may benefit this family.  Our contact number was provided. [name] questions were answered to her satisfaction, and she knows she is welcome to call us at anytime with additional questions or concerns.   Faith Rogue, MS Genetic Counselor [email or web address] Phone: [phone]

## 2018-05-10 ENCOUNTER — Ambulatory Visit: Payer: Federal, State, Local not specified - PPO | Admitting: Internal Medicine

## 2018-05-10 ENCOUNTER — Encounter: Payer: Self-pay | Admitting: Internal Medicine

## 2018-05-10 ENCOUNTER — Other Ambulatory Visit: Payer: Self-pay

## 2018-05-10 DIAGNOSIS — M79601 Pain in right arm: Secondary | ICD-10-CM

## 2018-05-10 DIAGNOSIS — S46219A Strain of muscle, fascia and tendon of other parts of biceps, unspecified arm, initial encounter: Secondary | ICD-10-CM | POA: Insufficient documentation

## 2018-05-10 NOTE — Progress Notes (Signed)
   Subjective:   Patient ID: [name], female    DOB: 1950/03/19, 68 y.o.   MRN: [id number]  HPI The patient is a 67 YO female coming in for right arm pain and swelling/bruising. She was pulling off a price tag and felt a tearing in the arm. This happened last week. She then noticed bruising later in the day. There is also some bulging in that area. She is getting pain with lifting anything. Denies numbness or tingling in that arm. Overall stable since onset. HAs not tried anything for it.   Review of Systems  Constitutional: Negative.   HENT: Negative.   Eyes: Negative.   Respiratory: Negative for cough, chest tightness and shortness of breath.   Cardiovascular: Negative for chest pain, palpitations and leg swelling.  Gastrointestinal: Negative for abdominal distention, abdominal pain, constipation, diarrhea, nausea and vomiting.  Musculoskeletal: Positive for arthralgias and myalgias.  Skin: Negative.   Neurological: Negative.   Psychiatric/Behavioral: Negative.     Objective:  Physical Exam Constitutional:      Appearance: She is well-developed.  HENT:     Head: Normocephalic and atraumatic.  Neck:     Musculoskeletal: Normal range of motion.  Cardiovascular:     Rate and Rhythm: Normal rate and regular rhythm.  Pulmonary:     Effort: Pulmonary effort is normal. No respiratory distress.     Breath sounds: Normal breath sounds. No wheezing or rales.  Abdominal:     General: There is no distension.     Palpations: Abdomen is soft.     Tenderness: There is no abdominal tenderness. There is no rebound.  Musculoskeletal:        General: Tenderness present.     Comments: Bruising right arm above the elbow region with tenderness to palpation, some prominence and swelling of the biceps region and tendon compared to left, distal pulses intact  Skin:    General: Skin is warm and dry.  Neurological:     Mental Status: She is alert and oriented to person, place, and time.   Coordination: Coordination normal.     Vitals:   05/10/18 0833  BP: 140/82  Pulse: 81  Temp: 97.9 F (36.6 C)  TempSrc: Oral  SpO2: 99%  Weight: 136 lb (61.7 kg)  Height: 5\' 4"  (1.626 m)    Assessment & Plan:

## 2018-05-10 NOTE — Patient Instructions (Signed)
We will get you in with sports medicine tomorrow at [facility] with Dr. Raeford Razor.   In the meantime ice for 20 minutes every 4 hours and avoid lifting with that arm. If swelling use compression.

## 2018-05-10 NOTE — Assessment & Plan Note (Signed)
Suspect tendon partial tear. Recommended ice and avoid lifting. Visit with sports medicine arranged for close follow up and evaluation.

## 2018-05-12 ENCOUNTER — Encounter: Payer: Self-pay | Admitting: Family Medicine

## 2018-05-12 ENCOUNTER — Other Ambulatory Visit: Payer: Self-pay

## 2018-05-12 ENCOUNTER — Ambulatory Visit: Payer: Federal, State, Local not specified - PPO | Admitting: Family Medicine

## 2018-05-12 VITALS — BP 130/82 | HR 84 | Resp 16 | Ht 64.0 in | Wt 133.0 lb

## 2018-05-12 DIAGNOSIS — S46211A Strain of muscle, fascia and tendon of other parts of biceps, right arm, initial encounter: Secondary | ICD-10-CM | POA: Diagnosis not present

## 2018-05-12 NOTE — Assessment & Plan Note (Signed)
Symptoms suggestive of long head of biceps rupture. -Counseled on home exercise therapy and supportive care. -Could consider imaging or physical therapy if no improvement.  Or injection

## 2018-05-12 NOTE — Patient Instructions (Signed)
Nice to meet you  Please try the exercises  Please try an anti-inflammatory for pain  Please see me back in 3-4 weeks if no better.

## 2018-05-12 NOTE — Progress Notes (Signed)
[name] - 68 y.o. female MRN [id number]  Date of birth: 09-25-1950  SUBJECTIVE:  Including CC & ROS.  No chief complaint on file.   [name] is a 69 y.o. female that is presenting with right upper arm pain.  She was seen on 3/11 after having bruising of her upper arm.  She felt a pop when she was removing a tag.  Denies any radicular symptoms.  Does have improvement of the pain overall.  Denies any history of similar symptoms..   Review of Systems  Constitutional: Negative for fever.  HENT: Negative for congestion.   Respiratory: Negative for cough.   Cardiovascular: Negative for chest pain.  Gastrointestinal: Negative for abdominal pain.  Musculoskeletal: Negative for gait problem.  Skin: Positive for color change.  Neurological: Negative for weakness.  Hematological: Negative for adenopathy.  Psychiatric/Behavioral: Negative for agitation.    HISTORY: Past Medical, Surgical, Social, and Family History Reviewed & Updated per EMR.   Pertinent Historical Findings include:  Past Medical History:  Diagnosis Date  . ANEMIA-NOS 11/07/2007  . Family history of breast cancer   . Family history of ovarian cancer   . HIP PAIN 02/06/2010  . HYPERGLYCEMIA 02/06/2010  . HYPERTENSION 11/07/2007  . OSTEOARTHRITIS 11/07/2007  . URI 08/08/2009    Past Surgical History:  Procedure Laterality Date  . VAGINAL HYSTERECTOMY  03/12/2008   with BSO    Allergies  Allergen Reactions  . Lyrica [Pregabalin]     Extreme dry mouth, dizziness  . Tramadol Nausea And Vomiting and Other (See Comments)    Dizziness    Family History  Problem Relation Age of Onset  . Cancer Father        mesothelioma  . Diabetes Mother   . Hypertension Mother   . Hypertension Other   . Breast cancer Paternal Aunt 58  . Ovarian cancer Maternal Aunt        late 33s  . Lupus Daughter   . Colon cancer Neg Hx   . Prostate cancer Neg Hx      Social History   Socioeconomic History  . Marital status:  Married    Spouse name: Carlis Abbott  . Number of children: 3  . Years of education: Not on file  . Highest education level: Not on file  Occupational History  . Not on file  Social Needs  . Financial resource strain: Not on file  . Food insecurity:    Worry: Not on file    Inability: Not on file  . Transportation needs:    Medical: Not on file    Non-medical: Not on file  Tobacco Use  . Smoking status: Former Smoker    Last attempt to quit: 03/01/1973    Years since quitting: 45.2  . Smokeless tobacco: Never Used  Substance and Sexual Activity  . Alcohol use: No  . Drug use: No  . Sexual activity: Yes  Lifestyle  . Physical activity:    Days per week: Not on file    Minutes per session: Not on file  . Stress: Not on file  Relationships  . Social connections:    Talks on phone: Not on file    Gets together: Not on file    Attends religious service: Not on file    Active member of club or organization: Not on file    Attends meetings of clubs or organizations: Not on file    Relationship status: Not on file  . Intimate partner violence:  Fear of current or ex partner: Not on file    Emotionally abused: Not on file    Physically abused: Not on file    Forced sexual activity: Not on file  Other Topics Concern  . Not on file  Social History Narrative  . Not on file     PHYSICAL EXAM:  VS: BP 130/82   Pulse 84   Resp 16   Ht 5\' 4"  (1.626 m)   Wt 133 lb (60.3 kg)   SpO2 98%   BMI 22.83 kg/m  Physical Exam Gen: NAD, alert, cooperative with exam, well-appearing ENT: normal lips, normal nasal mucosa,  Eye: normal EOM, normal conjunctiva and lids CV:  no edema, +2 pedal pulses   Resp: no accessory muscle use, non-labored,   Skin: no rashes, no areas of induration  Neuro: normal tone, normal sensation to touch Psych:  normal insight, alert and oriented MSK:  Right arm:  Ecchymosis of the upper arm  popeye deformity of the bicep  Normal Shoulder ROM  Normal  strength to resistance  Neurovascularly intact       ASSESSMENT & PLAN:   Biceps tendon rupture Symptoms suggestive of long head of biceps rupture. -Counseled on home exercise therapy and supportive care. -Could consider imaging or physical therapy if no improvement.  Or injection

## 2018-05-15 ENCOUNTER — Encounter: Payer: Self-pay | Admitting: Physical Therapy

## 2018-05-15 ENCOUNTER — Other Ambulatory Visit: Payer: Self-pay

## 2018-05-15 ENCOUNTER — Ambulatory Visit: Payer: Federal, State, Local not specified - PPO | Attending: Surgery | Admitting: Physical Therapy

## 2018-05-15 DIAGNOSIS — D0511 Intraductal carcinoma in situ of right breast: Secondary | ICD-10-CM | POA: Insufficient documentation

## 2018-05-15 DIAGNOSIS — R293 Abnormal posture: Secondary | ICD-10-CM

## 2018-05-15 NOTE — Therapy (Signed)
[place], [place], [address] Phone: [phone]   Fax:  [phone]  Physical Therapy Evaluation  Patient Details  Name: [name] MRN: [id number] Date of Birth: [birth year] Referring Provider (PT): Dr. Erroll Luna   Encounter Date: 05/15/2018  PT End of Session - 05/15/18 1507    Visit Number  1    Number of Visits  2    Date for PT Re-Evaluation  07/10/18    PT Start Time  1410    PT Stop Time  1453    PT Time Calculation (min)  43 min    Activity Tolerance  Patient tolerated treatment well    Behavior During Therapy  [facility] for tasks assessed/performed       Past Medical History:  Diagnosis Date  . ANEMIA-NOS 11/07/2007  . Family history of breast cancer   . Family history of ovarian cancer   . HIP PAIN 02/06/2010  . HYPERGLYCEMIA 02/06/2010  . HYPERTENSION 11/07/2007  . OSTEOARTHRITIS 11/07/2007  . URI 08/08/2009    Past Surgical History:  Procedure Laterality Date  . VAGINAL HYSTERECTOMY  03/12/2008   with BSO    There were no vitals filed for this visit.   Subjective Assessment - 05/15/18 1457    Subjective  Patient reports she is here today to be seen for a pre-operative baseline assessment realted ot her breast cancer. She also reports she ruptured her right bicep tendon on 05/02/2018 but her orthopedist recommended no surgery at this time. She reports little to no pain associated with this injury.    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 THe area measures 5.6 cm and is located in the upper outer quadrant. It is estrogen receptive negative. She has no other medical problems.    Limitations  Sitting    Patient Stated Goals  Learn post op shoulder ROM HEP and reduce lymphedema risk.    Currently in Pain?  Yes    Pain Score  5     Pain Location  Hip    Pain Orientation  Left    Pain Descriptors /  Indicators  Aching    Pain Type  Chronic pain    Pain Onset  More than a month ago    Pain Frequency  Intermittent    Aggravating Factors   sitting prolonged periods    Pain Relieving Factors  Moving around    Multiple Pain Sites  No         [facility] PT Assessment - 05/15/18 0001      Assessment   Medical Diagnosis  Right breast cancer    Referring Provider (PT)  Dr. Marcello Moores Cornett    Onset Date/Surgical Date  04/21/18    Hand Dominance  Right    Prior Therapy  No      Precautions   Precautions  Other (comment)    Precaution Comments  active cancer      Restrictions   Weight Bearing Restrictions  No      Balance Screen   Has the patient fallen in the past 6 months  No    Has the patient had a decrease in activity level because of a fear of falling?   No    Is the patient reluctant to leave their home because of a fear of falling?   No      Home Environment  Living Environment  Private residence    Living Arrangements  Spouse/significant other    Available Help at Discharge  Family      Prior Function   Level of [place]  Retired    Leisure  She tries to get 10,000 steps per day      Cognition   Overall Cognitive Status  Within Functional Limits for tasks assessed      Posture/Postural Control   Posture/Postural Control  Postural limitations    Postural Limitations  Rounded Shoulders;Forward head      ROM / Strength   AROM / PROM / Strength  AROM;Strength      AROM   AROM Assessment Site  Shoulder;Cervical    Right/Left Shoulder  Right;Left    Right Shoulder Extension  60 Degrees    Right Shoulder Flexion  152 Degrees    Right Shoulder ABduction  165 Degrees    Right Shoulder Internal Rotation  67 Degrees    Right Shoulder External Rotation  68 Degrees    Left Shoulder Extension  50 Degrees    Left Shoulder Flexion  155 Degrees    Left Shoulder ABduction  161 Degrees    Left Shoulder Internal Rotation  69 Degrees    Left  Shoulder External Rotation  64 Degrees    Cervical Flexion  WNL    Cervical Extension  WNL    Cervical - Right Side Bend  WNL    Cervical - Left Side Bend  WNL    Cervical - Right Rotation  WNL    Cervical - Left Rotation  WNL      Strength   Overall Strength  Within functional limits for tasks performed        LYMPHEDEMA/ONCOLOGY QUESTIONNAIRE - 05/15/18 1504      Type   Cancer Type  Right breast cancer DCIS      Surgeries   Mastectomy Date  05/23/18      Lymphedema Assessments   Lymphedema Assessments  Upper extremities      Right Upper Extremity Lymphedema   10 cm Proximal to Olecranon Process  25.3 cm    Olecranon Process  23.2 cm    10 cm Proximal to Ulnar Styloid Process  17.6 cm    Just Proximal to Ulnar Styloid Process  12.8 cm    Across Hand at [employer]  16.9 cm    At [place] of 2nd Digit  4.9 cm      Left Upper Extremity Lymphedema   10 cm Proximal to Olecranon Process  25 cm    Olecranon Process  22.2 cm    10 cm Proximal to Ulnar Styloid Process  17.2 cm    Just Proximal to Ulnar Styloid Process  12.6 cm    Across Hand at [employer]  16.5 cm    At [place] of 2nd Digit  4.9 cm          Quick Dash - 05/15/18 0001    Open a tight or new jar  No difficulty    Do heavy household chores (wash walls, wash floors)  No difficulty    Carry a shopping bag or briefcase  No difficulty    Wash your back  No difficulty    Use a knife to cut food  No difficulty    Recreational activities in which you take some force or impact through your arm, shoulder, or hand (golf, hammering, tennis)  No difficulty  During the past week, to what extent has your arm, shoulder or hand problem interfered with your normal social activities with family, friends, neighbors, or groups?  Not at all    During the past week, to what extent has your arm, shoulder or hand problem limited your work or other regular daily activities  Not at all    Arm, shoulder, or hand pain.  None     Tingling (pins and needles) in your arm, shoulder, or hand  None    Difficulty Sleeping  No difficulty    DASH Score  0 %        Objective measurements completed on examination: See above findings.      Patient was instructed today in a home exercise program today for post op shoulder range of motion. These included active assist shoulder flexion in sitting, scapular retraction, wall walking with shoulder abduction, and hands behind head external rotation.  She was encouraged to do these twice a day, holding 3 seconds and repeating 5 times when permitted by her physician.      PT Education - 05/15/18 1506    Education Details  Lymphedema risk reduction and post op shoulder ROM HEP    Person(s) Educated  Patient;Spouse    Methods  Explanation;Demonstration;Handout    Comprehension  Returned demonstration;Verbalized understanding          PT Long Term Goals - 05/15/18 1512      PT LONG TERM GOAL #1   Title  Patient will demonstrate she has regainde full shoulder ROM and fucntion post operatively compared to baseline assessments.    Time  8    Period  Weeks    Status  New      Breast Clinic Goals - 05/15/18 1510      Patient will be able to verbalize understanding of pertinent lymphedema risk reduction practices relevant to her diagnosis specifically related to skin care.   Time  1    Period  Days    Status  Achieved      Patient will be able to return demonstrate and/or verbalize understanding of the post-op home exercise program related to regaining shoulder range of motion.   Time  1    Period  Days    Status  Achieved      Patient will be able to verbalize understanding of the importance of attending the postoperative After Breast Cancer Class for further lymphedema risk reduction education and therapeutic exercise.   Time  1    Period  Days    Status  Achieved            Plan - 05/15/18 1507    Clinical Impression Statement  Patient was diagnosed on  04/21/2018 with right high grade DCIS. THe area measures 5.6 cm and is located in the upper outer quadrant. It is estrogen receptive negative. She has no other medical problems. Her multidisciplinary medical team met to determine a recommended treatment plan. She was undecided at the time about a lumpectomy versu a mastectomy but has now decided on a right mastectomy wiht a sentinel node biopsy scheduled for 05/23/2018. She has declined reconstruction. She will benefit from a post op PT reassessment to determine needs.    Stability/Clinical Decision Making  Stable/Uncomplicated    Clinical Decision Making  Low    Rehab Potential  Excellent    PT Frequency  --   Eval and 1 f/u visit   PT Treatment/Interventions  ADLs/Self Care Home Management;Patient/family education  PT Next Visit Plan  Will reassess 3-4 weeks after surgery to determine needs    PT Home Exercise Plan  Post op shoulder ROM HEP    Consulted and Agree with Plan of Care  Patient;Family member/caregiver    Family Member Consulted  Husband       Patient will benefit from skilled therapeutic intervention in order to improve the following deficits and impairments:  Postural dysfunction, Decreased knowledge of precautions, Pain, Impaired UE functional use, Decreased range of motion  Visit Diagnosis: Ductal carcinoma in situ (DCIS) of right breast - Plan: PT plan of care cert/re-cert  Abnormal posture - Plan: PT plan of care cert/re-cert   Patient will follow up at outpatient cancer rehab 3-4 weeks following surgery.  If the patient requires physical therapy at that time, a specific plan will be dictated and sent to the referring physician for approval. The patient was educated today on appropriate basic range of motion exercises to begin post operatively and the importance of attending the After Breast Cancer class following surgery.  Patient was educated today on lymphedema risk reduction practices as it pertains to recommendations  that will benefit the patient immediately following surgery.  She verbalized good understanding.     Problem List Patient Active Problem List   Diagnosis Date Noted  . Biceps tendon rupture 05/10/2018  . Genetic testing 05/08/2018  . Family history of breast cancer   . Family history of ovarian cancer   . 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 Mass of left thigh 07/14/2017  . Hematoma of left thigh 03/18/2015  . Pain in joint, lower leg 04/05/2014  . Hand pain 03/29/2012  . Fibromyalgia 03/29/2012  . Well adult exam 03/17/2011  . HIP PAIN 02/06/2010  . HYPERGLYCEMIA 02/06/2010  . URI 08/08/2009  . ANEMIA-NOS 11/07/2007  . Essential hypertension 11/07/2007  . OSTEOARTHRITIS 11/07/2007   Annia Friendly, PT 05/15/18 3:15 PM  [place], [place], [address] Phone: [phone]   Fax:  [phone]  Name: Humaira Sculley MRN: [id number] Date of Birth: May 15, 1950

## 2018-05-15 NOTE — Patient Instructions (Signed)

## 2018-05-17 ENCOUNTER — Encounter (HOSPITAL_COMMUNITY): Payer: Self-pay

## 2018-05-17 ENCOUNTER — Other Ambulatory Visit: Payer: Self-pay

## 2018-05-17 ENCOUNTER — Encounter (HOSPITAL_COMMUNITY)
Admission: RE | Admit: 2018-05-17 | Discharge: 2018-05-17 | Disposition: A | Discharge status: Home / Self Care | Payer: Federal, State, Local not specified - PPO | Source: Ambulatory Visit | Attending: Surgery | Admitting: Surgery

## 2018-05-17 DIAGNOSIS — D0511 Intraductal carcinoma in situ of right breast: Secondary | ICD-10-CM

## 2018-05-17 DIAGNOSIS — Z01818 Encounter for other preprocedural examination: Secondary | ICD-10-CM | POA: Diagnosis present

## 2018-05-17 DIAGNOSIS — I10 Essential (primary) hypertension: Secondary | ICD-10-CM | POA: Insufficient documentation

## 2018-05-17 HISTORY — DX: Malignant (primary) neoplasm, unspecified: C80.1

## 2018-05-17 LAB — CBC WITH DIFFERENTIAL/PLATELET
Abs Immature Granulocytes: 0.02 10*3/uL (ref 0.00–0.07)
Basophils Absolute: 0.1 10*3/uL (ref 0.0–0.1)
Basophils Relative: 1 %
EOS ABS: 0.3 10*3/uL (ref 0.0–0.5)
Eosinophils Relative: 5 %
HCT: 41.9 % (ref 36.0–46.0)
Hemoglobin: 12.6 g/dL (ref 12.0–15.0)
Immature Granulocytes: 0 %
Lymphocytes Relative: 29 %
Lymphs Abs: 1.9 10*3/uL (ref 0.7–4.0)
MCH: 26.9 pg (ref 26.0–34.0)
MCHC: 30.1 g/dL (ref 30.0–36.0)
MCV: 89.5 fL (ref 80.0–100.0)
Monocytes Absolute: 0.5 10*3/uL (ref 0.1–1.0)
Monocytes Relative: 8 %
Neutro Abs: 3.8 10*3/uL (ref 1.7–7.7)
Neutrophils Relative %: 57 %
Platelets: 323 10*3/uL (ref 150–400)
RBC: 4.68 MIL/uL (ref 3.87–5.11)
RDW: 13.6 % (ref 11.5–15.5)
WBC: 6.6 10*3/uL (ref 4.0–10.5)
nRBC: 0 % (ref 0.0–0.2)

## 2018-05-17 LAB — COMPREHENSIVE METABOLIC PANEL WITH GFR
ALT: 16 U/L (ref 0–44)
AST: 17 U/L (ref 15–41)
Albumin: 4 g/dL (ref 3.5–5.0)
Alkaline Phosphatase: 102 U/L (ref 38–126)
Anion gap: 7 (ref 5–15)
BUN: 11 mg/dL (ref 8–23)
CO2: 30 mmol/L (ref 22–32)
Calcium: 9.8 mg/dL (ref 8.9–10.3)
Chloride: 104 mmol/L (ref 98–111)
Creatinine, Ser: 0.78 mg/dL (ref 0.44–1.00)
GFR calc Af Amer: >60 mL/min
GFR calc non Af Amer: >60 mL/min
Glucose, Bld: 112 mg/dL — ABNORMAL HIGH (ref 70–99)
Potassium: 3.7 mmol/L (ref 3.5–5.1)
Sodium: 141 mmol/L (ref 135–145)
Total Bilirubin: 0.4 mg/dL (ref 0.3–1.2)
Total Protein: 6.8 g/dL (ref 6.5–8.1)

## 2018-05-17 NOTE — Pre-Procedure Instructions (Signed)
Nithya Meriweather  05/17/2018      [id number] [address], [place] [address] Phone: [phone] Fax: [phone]    Your procedure is scheduled on March 24  Report to [facility] at 1100 A.M.  Call this number if you have problems the morning of surgery:  [phone]   Remember:  Do not eat after midnight.  You may drink clear liquids until 1000 am the morning of your surgery .  Clear liquids allowed are:                    Water, Juice (non-citric and without pulp), Carbonated beverages, Clear Tea, Black Coffee only and Gatorade    Take these medicines the morning of surgery with A SIP OF WATER  amLODipine (NORVASC)  7 days prior to surgery STOP taking an ymeloxicam [facility]), Aspirin (unless otherwise instructed by your surgeon), Aleve, Naproxen, Ibuprofen, Motrin, Advil, Goody's, BC's, all herbal medications, fish oil, and all vitamins.    Do not wear jewelry, make-up or nail polish.  Do not wear lotions, powders, or perfumes, or deodorant.  Do not shave 48 hours prior to surgery.   Do not bring valuables to the hospital.  [facility] is not responsible for any belongings or valuables.  Contacts, dentures or bridgework may not be worn into surgery.  Leave your suitcase in the car.  After surgery it may be brought to your room.  For patients admitted to the hospital, discharge time will be determined by your treatment team.  Patients discharged the day of surgery will not be allowed to drive home.    Special instructions:   [facility]- Preparing For Surgery  Before surgery, you can play an important role. Because skin is not sterile, your skin needs to be as free of germs as possible. You can reduce the number of germs on your skin by washing with CHG (chlorahexidine gluconate) Soap before surgery.  CHG is an antiseptic cleaner which kills germs and bonds with the skin to continue  killing germs even after washing.    Oral Hygiene is also important to reduce your risk of infection.  Remember - BRUSH YOUR TEETH THE MORNING OF SURGERY WITH YOUR REGULAR TOOTHPASTE  Please do not use if you have an allergy to CHG or antibacterial soaps. If your skin becomes reddened/irritated stop using the CHG.  Do not shave (including legs and underarms) for at least 48 hours prior to first CHG shower. It is OK to shave your face.  Please follow these instructions carefully.   1. Shower the NIGHT BEFORE SURGERY and the MORNING OF SURGERY with CHG.   2. If you chose to wash your hair, wash your hair first as usual with your normal shampoo.  3. After you shampoo, rinse your hair and body thoroughly to remove the shampoo.  4. Use CHG as you would any other liquid soap. You can apply CHG directly to the skin and wash gently with a scrungie or a clean washcloth.   5. Apply the CHG Soap to your body ONLY FROM THE NECK DOWN.  Do not use on open wounds or open sores. Avoid contact with your eyes, ears, mouth and genitals (private parts). Wash Face and genitals (private parts)  with your normal soap.  6. Wash thoroughly, paying special attention to the area where your surgery will be performed.  7. Thoroughly rinse your body with warm  water from the neck down.  8. DO NOT shower/wash with your normal soap after using and rinsing off the CHG Soap.  9. Pat yourself dry with a CLEAN TOWEL.  10. Wear CLEAN PAJAMAS to bed the night before surgery, wear comfortable clothes the morning of surgery  11. Place CLEAN SHEETS on your bed the night of your first shower and DO NOT SLEEP WITH PETS.    Day of Surgery:  Do not apply any deodorants/lotions.  Please wear clean clothes to the hospital/surgery center.   Remember to brush your teeth WITH YOUR REGULAR TOOTHPASTE.    Please read over the following fact sheets that you were given.

## 2018-05-17 NOTE — Progress Notes (Signed)
PCP - Lew Dawes Cardiologist - denies  Chest x-ray - not needed EKG - 05/17/18 Stress Test - denies ECHO - denies Cardiac Cath - denies  Anesthesia review: yes  Patient denies shortness of breath, fever, cough and chest pain at PAT appointment   Patient verbalized understanding of instructions that were given to them at the PAT appointment. Patient was also instructed that they will need to review over the PAT instructions again at home before surgery.

## 2018-05-17 NOTE — Progress Notes (Signed)

## 2018-05-23 ENCOUNTER — Ambulatory Visit (HOSPITAL_COMMUNITY): Payer: Federal, State, Local not specified - PPO | Admitting: Certified Registered Nurse Anesthetist

## 2018-05-23 ENCOUNTER — Observation Stay (HOSPITAL_COMMUNITY)
Admission: RE | Admit: 2018-05-23 | Discharge: 2018-05-24 | Disposition: A | Discharge status: Home / Self Care | Payer: Federal, State, Local not specified - PPO | Attending: Surgery | Admitting: Surgery

## 2018-05-23 ENCOUNTER — Encounter (HOSPITAL_COMMUNITY): Payer: Self-pay | Admitting: Certified Registered Nurse Anesthetist

## 2018-05-23 ENCOUNTER — Ambulatory Visit (HOSPITAL_COMMUNITY): Payer: Federal, State, Local not specified - PPO | Admitting: Physician Assistant

## 2018-05-23 ENCOUNTER — Other Ambulatory Visit: Payer: Self-pay

## 2018-05-23 ENCOUNTER — Ambulatory Visit (HOSPITAL_COMMUNITY): Payer: Federal, State, Local not specified - PPO

## 2018-05-23 ENCOUNTER — Ambulatory Visit (HOSPITAL_COMMUNITY)
Admission: RE | Admit: 2018-05-23 | Discharge: 2018-05-23 | Disposition: A | Discharge status: Home / Self Care | Payer: Federal, State, Local not specified - PPO | Source: Ambulatory Visit | Attending: Surgery | Admitting: Surgery

## 2018-05-23 ENCOUNTER — Encounter (HOSPITAL_COMMUNITY)
Admission: RE | Disposition: A | Discharge status: Home / Self Care | Payer: Self-pay | Source: Home / Self Care | Attending: Surgery

## 2018-05-23 DIAGNOSIS — C50411 Malignant neoplasm of upper-outer quadrant of right female breast: Principal | ICD-10-CM | POA: Insufficient documentation

## 2018-05-23 DIAGNOSIS — M797 Fibromyalgia: Secondary | ICD-10-CM | POA: Diagnosis not present

## 2018-05-23 DIAGNOSIS — D0511 Intraductal carcinoma in situ of right breast: Secondary | ICD-10-CM | POA: Diagnosis present

## 2018-05-23 DIAGNOSIS — D630 Anemia in neoplastic disease: Secondary | ICD-10-CM | POA: Insufficient documentation

## 2018-05-23 DIAGNOSIS — I10 Essential (primary) hypertension: Secondary | ICD-10-CM | POA: Insufficient documentation

## 2018-05-23 DIAGNOSIS — Z87891 Personal history of nicotine dependence: Secondary | ICD-10-CM | POA: Insufficient documentation

## 2018-05-23 HISTORY — PX: SIMPLE MASTECTOMY: SHX2409

## 2018-05-23 HISTORY — PX: SIMPLE MASTECTOMY WITH AXILLARY SENTINEL NODE BIOPSY: SHX6098

## 2018-05-23 LAB — GLUCOSE, CAPILLARY: Glucose-Capillary: 108 mg/dL — ABNORMAL HIGH (ref 70–99)

## 2018-05-23 SURGERY — SIMPLE MASTECTOMY WITH AXILLARY SENTINEL NODE BIOPSY
Anesthesia: General | Site: Breast | Laterality: Right

## 2018-05-23 MED ORDER — TECHNETIUM TC 99M SULFUR COLLOID FILTERED
1.0000 | Freq: Once | INTRAVENOUS | Status: AC | PRN
  Administered 2018-05-23: 1 via INTRADERMAL

## 2018-05-23 MED ORDER — ENOXAPARIN SODIUM 40 MG/0.4ML ~~LOC~~ SOLN
40.0000 mg | SUBCUTANEOUS | Status: DC
  Administered 2018-05-24: 40 mg via SUBCUTANEOUS
  Filled 2018-05-23: qty 0.4

## 2018-05-23 MED ORDER — ONDANSETRON HCL 4 MG/2ML IJ SOLN
4.0000 mg | Freq: Four times a day (QID) | INTRAMUSCULAR | Status: DC | PRN
  Administered 2018-05-23: 4 mg via INTRAVENOUS
  Filled 2018-05-23: qty 2

## 2018-05-23 MED ORDER — ONDANSETRON 4 MG PO TBDP
4.0000 mg | ORAL_TABLET | Freq: Four times a day (QID) | ORAL | Status: DC | PRN
  Administered 2018-05-23: 4 mg via ORAL
  Filled 2018-05-23: qty 1

## 2018-05-23 MED ORDER — ONDANSETRON HCL 4 MG/2ML IJ SOLN
INTRAMUSCULAR | Status: DC | PRN
  Administered 2018-05-23: 4 mg via INTRAVENOUS

## 2018-05-23 MED ORDER — DEXAMETHASONE SODIUM PHOSPHATE 10 MG/ML IJ SOLN
INTRAMUSCULAR | Status: AC
  Filled 2018-05-23: qty 1

## 2018-05-23 MED ORDER — LACTATED RINGERS IV SOLN: INTRAVENOUS | Status: DC

## 2018-05-23 MED ORDER — CHLORHEXIDINE GLUCONATE CLOTH 2 % EX PADS: 6.0000 | MEDICATED_PAD | Freq: Once | CUTANEOUS | Status: DC

## 2018-05-23 MED ORDER — ACETAMINOPHEN 500 MG PO TABS
ORAL_TABLET | ORAL | Status: AC
  Administered 2018-05-23: 1000 mg via ORAL
  Filled 2018-05-23: qty 2

## 2018-05-23 MED ORDER — FENTANYL CITRATE (PF) 250 MCG/5ML IJ SOLN
INTRAMUSCULAR | Status: AC
  Filled 2018-05-23: qty 5

## 2018-05-23 MED ORDER — PROPOFOL 10 MG/ML IV BOLUS
INTRAVENOUS | Status: DC | PRN
  Administered 2018-05-23: 150 mg via INTRAVENOUS

## 2018-05-23 MED ORDER — MIDAZOLAM HCL 2 MG/2ML IJ SOLN
INTRAMUSCULAR | Status: AC
  Filled 2018-05-23: qty 2

## 2018-05-23 MED ORDER — HYDROMORPHONE HCL 1 MG/ML IJ SOLN
INTRAMUSCULAR | Status: AC
  Filled 2018-05-23: qty 1

## 2018-05-23 MED ORDER — SODIUM CHLORIDE (PF) 0.9 % IJ SOLN
INTRAMUSCULAR | Status: AC
  Filled 2018-05-23: qty 10

## 2018-05-23 MED ORDER — FENTANYL CITRATE (PF) 100 MCG/2ML IJ SOLN
INTRAMUSCULAR | Status: DC | PRN
  Administered 2018-05-23: 25 ug via INTRAVENOUS
  Administered 2018-05-23: 50 ug via INTRAVENOUS

## 2018-05-23 MED ORDER — CEFAZOLIN SODIUM-DEXTROSE 2-4 GM/100ML-% IV SOLN
INTRAVENOUS | Status: AC
  Filled 2018-05-23: qty 100

## 2018-05-23 MED ORDER — HYDROMORPHONE HCL 1 MG/ML IJ SOLN
0.2500 mg | INTRAMUSCULAR | Status: DC | PRN
  Administered 2018-05-23: 0.25 mg via INTRAVENOUS

## 2018-05-23 MED ORDER — CEFAZOLIN SODIUM-DEXTROSE 2-4 GM/100ML-% IV SOLN
2.0000 g | INTRAVENOUS | Status: AC
  Administered 2018-05-23: 2 g via INTRAVENOUS

## 2018-05-23 MED ORDER — CELECOXIB 200 MG PO CAPS
ORAL_CAPSULE | ORAL | Status: AC
  Administered 2018-05-23: 400 mg via ORAL
  Filled 2018-05-23: qty 2

## 2018-05-23 MED ORDER — CELECOXIB 200 MG PO CAPS
400.0000 mg | ORAL_CAPSULE | ORAL | Status: AC
  Administered 2018-05-23: 400 mg via ORAL

## 2018-05-23 MED ORDER — PROMETHAZINE HCL 25 MG/ML IJ SOLN: 6.2500 mg | INTRAMUSCULAR | Status: DC | PRN

## 2018-05-23 MED ORDER — 0.9 % SODIUM CHLORIDE (POUR BTL) OPTIME
TOPICAL | Status: DC | PRN
  Administered 2018-05-23: 1000 mL

## 2018-05-23 MED ORDER — METHYLENE BLUE 0.5 % INJ SOLN
INTRAVENOUS | Status: AC
  Filled 2018-05-23: qty 10

## 2018-05-23 MED ORDER — GABAPENTIN 300 MG PO CAPS
ORAL_CAPSULE | ORAL | Status: AC
  Administered 2018-05-23: 300 mg via ORAL
  Filled 2018-05-23: qty 1

## 2018-05-23 MED ORDER — PROPOFOL 10 MG/ML IV BOLUS
INTRAVENOUS | Status: AC
  Filled 2018-05-23: qty 20

## 2018-05-23 MED ORDER — PHENYLEPHRINE HCL 10 MG/ML IJ SOLN
INTRAMUSCULAR | Status: DC | PRN
  Administered 2018-05-23: 80 ug via INTRAVENOUS
  Administered 2018-05-23 (×2): 120 ug via INTRAVENOUS

## 2018-05-23 MED ORDER — FENTANYL CITRATE (PF) 100 MCG/2ML IJ SOLN: 50.0000 ug | INTRAMUSCULAR | Status: DC | PRN

## 2018-05-23 MED ORDER — FENTANYL CITRATE (PF) 100 MCG/2ML IJ SOLN
INTRAMUSCULAR | Status: AC
  Filled 2018-05-23: qty 2

## 2018-05-23 MED ORDER — HYDRALAZINE HCL 20 MG/ML IJ SOLN: 10.0000 mg | INTRAMUSCULAR | Status: DC | PRN

## 2018-05-23 MED ORDER — KCL IN DEXTROSE-NACL 20-5-0.9 MEQ/L-%-% IV SOLN
INTRAVENOUS | Status: DC
  Administered 2018-05-23 – 2018-05-24 (×2): via INTRAVENOUS
  Filled 2018-05-23 (×3): qty 1000

## 2018-05-23 MED ORDER — LIDOCAINE HCL (CARDIAC) PF 100 MG/5ML IV SOSY
PREFILLED_SYRINGE | INTRAVENOUS | Status: DC | PRN
  Administered 2018-05-23: 100 mg via INTRAVENOUS

## 2018-05-23 MED ORDER — SODIUM CHLORIDE 0.9 % IV SOLN
INTRAVENOUS | Status: DC | PRN
  Administered 2018-05-23: 25 ug/min via INTRAVENOUS

## 2018-05-23 MED ORDER — ONDANSETRON HCL 4 MG/2ML IJ SOLN
INTRAMUSCULAR | Status: AC
  Filled 2018-05-23: qty 2

## 2018-05-23 MED ORDER — CEFAZOLIN SODIUM-DEXTROSE 2-4 GM/100ML-% IV SOLN
2.0000 g | Freq: Three times a day (TID) | INTRAVENOUS | Status: AC
  Administered 2018-05-23: 2 g via INTRAVENOUS
  Filled 2018-05-23: qty 100

## 2018-05-23 MED ORDER — GABAPENTIN 300 MG PO CAPS
300.0000 mg | ORAL_CAPSULE | ORAL | Status: AC
  Administered 2018-05-23: 300 mg via ORAL

## 2018-05-23 MED ORDER — MIDAZOLAM HCL 2 MG/2ML IJ SOLN
INTRAMUSCULAR | Status: DC | PRN
  Administered 2018-05-23: 2 mg via INTRAVENOUS

## 2018-05-23 MED ORDER — DEXAMETHASONE SODIUM PHOSPHATE 10 MG/ML IJ SOLN
INTRAMUSCULAR | Status: DC | PRN
  Administered 2018-05-23: 10 mg via INTRAVENOUS

## 2018-05-23 MED ORDER — OXYCODONE HCL 5 MG PO TABS
5.0000 mg | ORAL_TABLET | ORAL | Status: DC | PRN
  Filled 2018-05-23: qty 2

## 2018-05-23 MED ORDER — ACETAMINOPHEN 500 MG PO TABS
1000.0000 mg | ORAL_TABLET | ORAL | Status: AC
  Administered 2018-05-23: 1000 mg via ORAL

## 2018-05-23 MED ORDER — METHOCARBAMOL 500 MG PO TABS
500.0000 mg | ORAL_TABLET | Freq: Four times a day (QID) | ORAL | Status: DC | PRN
  Administered 2018-05-24: 500 mg via ORAL
  Filled 2018-05-23 (×2): qty 1

## 2018-05-23 MED ORDER — PHENYLEPHRINE 40 MCG/ML (10ML) SYRINGE FOR IV PUSH (FOR BLOOD PRESSURE SUPPORT)
PREFILLED_SYRINGE | INTRAVENOUS | Status: AC
  Filled 2018-05-23: qty 10

## 2018-05-23 MED ORDER — AMLODIPINE BESYLATE 2.5 MG PO TABS: 2.5000 mg | ORAL_TABLET | Freq: Every day | ORAL | Status: DC

## 2018-05-23 MED ORDER — LACTATED RINGERS IV SOLN
INTRAVENOUS | Status: DC | PRN
  Administered 2018-05-23: 09:00:00 via INTRAVENOUS

## 2018-05-23 MED ORDER — MEPERIDINE HCL 50 MG/ML IJ SOLN: 6.2500 mg | INTRAMUSCULAR | Status: DC | PRN

## 2018-05-23 SURGICAL SUPPLY — 51 items
ADH SKN CLS APL DERMABOND .7 (GAUZE/BANDAGES/DRESSINGS) ×1
APPLIER CLIP 9.375 MED OPEN (MISCELLANEOUS) ×2
APR CLP MED 9.3 20 MLT OPN (MISCELLANEOUS) ×1
BINDER BREAST LRG (GAUZE/BANDAGES/DRESSINGS) IMPLANT
BINDER BREAST XLRG (GAUZE/BANDAGES/DRESSINGS) ×1 IMPLANT
BIOPATCH RED 1 DISK 7.0 (GAUZE/BANDAGES/DRESSINGS) ×2 IMPLANT
CANISTER SUCT 3000ML PPV (MISCELLANEOUS) ×2 IMPLANT
CHLORAPREP W/TINT 26ML (MISCELLANEOUS) ×2 IMPLANT
CLIP APPLIE 9.375 MED OPEN (MISCELLANEOUS) ×1 IMPLANT
CONT SPEC 4OZ CLIKSEAL STRL BL (MISCELLANEOUS) ×2 IMPLANT
COVER PROBE W GEL 5X96 (DRAPES) ×2 IMPLANT
COVER SURGICAL LIGHT HANDLE (MISCELLANEOUS) ×2 IMPLANT
COVER WAND RF STERILE (DRAPES) ×2 IMPLANT
DERMABOND ADVANCED (GAUZE/BANDAGES/DRESSINGS) ×1
DERMABOND ADVANCED .7 DNX12 (GAUZE/BANDAGES/DRESSINGS) ×1 IMPLANT
DRAIN CHANNEL 19F RND (DRAIN) ×2 IMPLANT
DRAPE CHEST BREAST 15X10 FENES (DRAPES) ×2 IMPLANT
DRSG TEGADERM 2-3/8X2-3/4 SM (GAUZE/BANDAGES/DRESSINGS) ×2 IMPLANT
ELECT CAUTERY BLADE 6.4 (BLADE) ×2 IMPLANT
ELECT REM PT RETURN 9FT ADLT (ELECTROSURGICAL) ×2
ELECTRODE REM PT RTRN 9FT ADLT (ELECTROSURGICAL) ×1 IMPLANT
EVACUATOR SILICONE 100CC (DRAIN) ×2 IMPLANT
GAUZE SPONGE 4X4 12PLY STRL (GAUZE/BANDAGES/DRESSINGS) ×1 IMPLANT
GLOVE BIO SURGEON STRL SZ8 (GLOVE) ×2 IMPLANT
GLOVE BIOGEL PI IND STRL 8 (GLOVE) ×1 IMPLANT
GLOVE BIOGEL PI INDICATOR 8 (GLOVE) ×1
GOWN STRL REUS W/ TWL LRG LVL3 (GOWN DISPOSABLE) ×3 IMPLANT
GOWN STRL REUS W/ TWL XL LVL3 (GOWN DISPOSABLE) ×1 IMPLANT
GOWN STRL REUS W/TWL LRG LVL3 (GOWN DISPOSABLE) ×6
GOWN STRL REUS W/TWL XL LVL3 (GOWN DISPOSABLE) ×2
KIT BASIN OR (CUSTOM PROCEDURE TRAY) ×2 IMPLANT
KIT TURNOVER KIT B (KITS) ×2 IMPLANT
NDL 18GX1X1/2 (RX/OR ONLY) (NEEDLE) IMPLANT
NDL FILTER BLUNT 18X1 1/2 (NEEDLE) IMPLANT
NDL HYPO 25GX1X1/2 BEV (NEEDLE) IMPLANT
NEEDLE 18GX1X1/2 (RX/OR ONLY) (NEEDLE) IMPLANT
NEEDLE FILTER BLUNT 18X 1/2SAF (NEEDLE)
NEEDLE FILTER BLUNT 18X1 1/2 (NEEDLE) IMPLANT
NEEDLE HYPO 25GX1X1/2 BEV (NEEDLE) IMPLANT
NS IRRIG 1000ML POUR BTL (IV SOLUTION) ×2 IMPLANT
PACK GENERAL/GYN (CUSTOM PROCEDURE TRAY) ×2 IMPLANT
PAD ABD 8X10 STRL (GAUZE/BANDAGES/DRESSINGS) ×1 IMPLANT
PAD ARMBOARD 7.5X6 YLW CONV (MISCELLANEOUS) ×2 IMPLANT
PENCIL SMOKE EVACUATOR (MISCELLANEOUS) ×2 IMPLANT
SPECIMEN JAR X LARGE (MISCELLANEOUS) ×2 IMPLANT
SUT ETHILON 3 0 FSL (SUTURE) ×2 IMPLANT
SUT MNCRL AB 4-0 PS2 18 (SUTURE) ×3 IMPLANT
SUT VIC AB 3-0 SH 18 (SUTURE) ×3 IMPLANT
SYR CONTROL 10ML LL (SYRINGE) IMPLANT
TOWEL OR 17X24 6PK STRL BLUE (TOWEL DISPOSABLE) ×2 IMPLANT
TOWEL OR 17X26 10 PK STRL BLUE (TOWEL DISPOSABLE) ×2 IMPLANT

## 2018-05-23 NOTE — Op Note (Signed)
Preoperative diagnosis: Multifocal right breast DCIS  Postop diagnosis: Same  Procedure: Right simple mastectomy with right axillary sentinel lymph node mapping  Surgeon: Thomas Cornett, MD  Anesthesia: LMA with pectoral block per anesthesia  EBL 20 cc  Drains: 219 round drains under mastectomy flaps  IV fluids: Per anesthesia record  Indications for procedure: The patient is a 68-year-old female with multifocal diffuse right breast DCIS verified by multiple biopsies.  We felt mastectomy is her best option given her diffuse in plentiful disease in all quadrants.  We discussed reconstruction but she opted against that for now.  Risk, benefits and other options discussed and the [facility] clinic.The surgical and non surgical options have been discussed with the patient.  Risks of surgery include bleeding,  Infection,  Flap necrosis,  Tissue loss,  Chronic pain, death, Numbness,  And the need for additional procedures.  Reconstruction options also have been discussed with the patient as well.  The patient agrees to proceed.Sentinel lymph node mapping and dissection has been discussed with the patient.  Risk of bleeding,  Infection,  Seroma formation,  Additional procedures,,  Shoulder weakness ,  Shoulder stiffness,  Nerve and blood vessel injury and reaction to the mapping dyes have been discussed.  Alternatives to surgery have been discussed with the patient.  The patient agrees to proceed.    Description of procedure: The patient was met in the holding area.  The right breast was marked as correct side and she underwent pectoral block procedure.  She had no questions.  She was taken back the operative room after injecting technetium sulfur colloid in the right breast per radiology protocol.  She was placed supine.  Induction of general esthesia right breast was prepped and draped in sterile fashion timeout was done.  Proper patient and procedure were verified.  Elliptical incisions were made above and  below the nipple areolar complex.  Dissection was was done up to the clavicle and down to the inframammary fold.  The breast was dissected off the chest wall in a medial to lateral fashion removing the breast in its entirety.  Neoprobe was used and the right axial contents were evaluated.  There were 2 hot sentinel nodes removed and identified in the level 1 deep basin.  The long thoracic nerve, thoracodorsal trunk and axillary vein were all preserved.  Hemostasis was achieved.  Through 2 separate stab incision a 19 round placed and secured with 2-0 nylon.  There was irrigated and found to be hemostatic.  It was closed with a deep layer 3-0 Vicryl and 4-0 Monocryl subcu cuticular stitch.  Dermabond applied.  Drains placed to bulb suction.  Breast binder placed.  All counts correct.  The patient was awoke extubated taken to recovery in satisfactory condition. 

## 2018-05-23 NOTE — Anesthesia Postprocedure Evaluation (Signed)
Anesthesia Post Note  Patient: [name]  Procedure(s) Performed: RIGHT SIMPLE MASTECTOMY WITH  RIGHT SENTINEL NODE MAPPING (Right Breast)     Patient location during evaluation: PACU Anesthesia Type: General Level of consciousness: sedated and patient cooperative Pain management: pain level controlled Vital Signs Assessment: post-procedure vital signs reviewed and stable Respiratory status: spontaneous breathing Cardiovascular status: stable Anesthetic complications: no    Last Vitals:  Vitals:   05/23/18 1125 05/23/18 1130  BP: 117/80   Pulse: 80 74  Resp: 17 16  Temp:    SpO2: 93% 95%    Last Pain:  Vitals:   05/23/18 1100  TempSrc:   PainSc: [place]

## 2018-05-23 NOTE — Anesthesia Preprocedure Evaluation (Signed)
Anesthesia Evaluation  Patient identified by MRN, date of birth, ID band Patient awake    Reviewed: Allergy & Precautions, NPO status , Patient's Chart, lab work & pertinent test results  Airway Mallampati: II  TM Distance: >3 FB Neck ROM: Full    Dental  (+) Dental Advisory Given   Pulmonary neg pulmonary ROS, former smoker,    Pulmonary exam normal breath sounds clear to auscultation       Cardiovascular hypertension, negative cardio ROS Normal cardiovascular exam Rhythm:Regular Rate:Normal     Neuro/Psych negative neurological ROS  negative psych ROS   GI/Hepatic negative GI ROS, Neg liver ROS,   Endo/Other  negative endocrine ROS  Renal/GU negative Renal ROS     Musculoskeletal  (+) Arthritis , Fibromyalgia -  Abdominal   Peds  Hematology  (+) Blood dyscrasia, anemia ,   Anesthesia Other Findings   Reproductive/Obstetrics negative OB ROS                             Anesthesia Physical Anesthesia Plan  ASA: II  Anesthesia Plan: General   Post-op Pain Management: GA combined w/ Regional for post-op pain   Induction: Intravenous  PONV Risk Score and Plan: 4 or greater and Ondansetron, Dexamethasone, Midazolam and Treatment may vary due to age or medical condition  Airway Management Planned: LMA  Additional Equipment: None  Intra-op Plan:   Post-operative Plan: Extubation in OR  Informed Consent: I have reviewed the patients History and Physical, chart, labs and discussed the procedure including the risks, benefits and alternatives for the proposed anesthesia with the patient or authorized representative who has indicated his/her understanding and acceptance.     Dental advisory given  Plan Discussed with: CRNA  Anesthesia Plan Comments:         Anesthesia Quick Evaluation

## 2018-05-23 NOTE — Anesthesia Procedure Notes (Signed)
Procedure Name: LMA Insertion Date/Time: 05/23/2018 9:10 AM Performed by: Inda Coke, CRNA Pre-anesthesia Checklist: Patient identified, Emergency Drugs available, Suction available and Patient being monitored Patient Re-evaluated:Patient Re-evaluated prior to induction Oxygen Delivery Method: Circle System Utilized Preoxygenation: Pre-oxygenation with 100% oxygen Induction Type: IV induction Ventilation: Mask ventilation without difficulty LMA: LMA inserted LMA Size: 4.0 Number of attempts: 1 Airway Equipment and Method: Bite block Placement Confirmation: positive ETCO2 Tube secured with: Tape Dental Injury: Teeth and Oropharynx as per pre-operative assessment

## 2018-05-23 NOTE — Anesthesia Procedure Notes (Signed)
Anesthesia Regional Block: Pectoralis block   Pre-Anesthetic Checklist: ,, timeout performed, Correct Patient, Correct Site, Correct Laterality, Correct Procedure, Correct Position, site marked, Risks and benefits discussed,  Surgical consent,  Pre-op evaluation,  At surgeon's request and post-op pain management  Laterality: Right  Prep: chloraprep       Needles:   Needle Type: Stimiplex     Needle Length: 9cm      Additional Needles:   Procedures:,,,, ultrasound used (permanent image in chart),,,,  Narrative:  Start time: 05/23/2018 8:45 AM End time: 05/23/2018 8:49 AM Injection made incrementally with aspirations every 5 mL.  Performed by: Personally  Anesthesiologist: Nolon Nations, MD  Additional Notes: Patient tolerated well. Good fascial spread noted.

## 2018-05-23 NOTE — Transfer of Care (Signed)
Immediate Anesthesia Transfer of Care Note  Patient: [name]  Procedure(s) Performed: RIGHT SIMPLE MASTECTOMY WITH  RIGHT SENTINEL NODE MAPPING (Right Breast)  Patient Location: PACU  Anesthesia Type:General  Level of Consciousness: awake and drowsy  Airway & Oxygen Therapy: Patient Spontanous Breathing and Patient connected to nasal cannula oxygen  Post-op Assessment: Report given to RN and Post -op Vital signs reviewed and stable  Post vital signs: Reviewed and stable  Last Vitals:  Vitals Value Taken Time  BP 122/88 05/23/2018 10:55 AM  Temp    Pulse 102 05/23/2018 10:57 AM  Resp 14 05/23/2018 10:57 AM  SpO2 98 % 05/23/2018 10:57 AM  Vitals shown include unvalidated device data.  Last Pain:  Vitals:   05/23/18 0719  TempSrc: Oral  PainSc: 0-No pain      Patients Stated Pain Goal: 2 (11/00/34 9611)  Complications: No apparent anesthesia complications

## 2018-05-23 NOTE — Interval H&P Note (Signed)
History and Physical Interval Note:  05/23/2018 8:44 AM  [name]  has presented today for surgery, with the diagnosis of right breast cancer.  The various methods of treatment have been discussed with the patient and family. After consideration of risks, benefits and other options for treatment, the patient has consented to  Procedure(s): RIGHT SIMPLE MASTECTOMY WITH  RIGHT SENTINEL NODE MAPPING (Right) as a surgical intervention.  The patient's history has been reviewed, patient examined, no change in status, stable for surgery.  I have reviewed the patient's chart and labs.  Questions were answered to the patient's satisfaction.     [place]

## 2018-05-24 ENCOUNTER — Encounter (HOSPITAL_COMMUNITY): Payer: Self-pay | Admitting: Surgery

## 2018-05-24 DIAGNOSIS — C50411 Malignant neoplasm of upper-outer quadrant of right female breast: Secondary | ICD-10-CM | POA: Diagnosis not present

## 2018-05-24 LAB — CBC
HCT: 31.6 % — ABNORMAL LOW (ref 36.0–46.0)
Hemoglobin: 10.1 g/dL — ABNORMAL LOW (ref 12.0–15.0)
MCH: 27.7 pg (ref 26.0–34.0)
MCHC: 32 g/dL (ref 30.0–36.0)
MCV: 86.8 fL (ref 80.0–100.0)
Platelets: 260 10*3/uL (ref 150–400)
RBC: 3.64 MIL/uL — ABNORMAL LOW (ref 3.87–5.11)
RDW: 13.4 % (ref 11.5–15.5)
WBC: 10.3 10*3/uL (ref 4.0–10.5)
nRBC: 0 % (ref 0.0–0.2)

## 2018-05-24 LAB — BASIC METABOLIC PANEL
Anion gap: 5 (ref 5–15)
BUN: 10 mg/dL (ref 8–23)
CHLORIDE: 106 mmol/L (ref 98–111)
CO2: 23 mmol/L (ref 22–32)
Calcium: 8.5 mg/dL — ABNORMAL LOW (ref 8.9–10.3)
Creatinine, Ser: 0.67 mg/dL (ref 0.44–1.00)
GFR calc Af Amer: >60 mL/min (ref >60)
GFR calc non Af Amer: >60 mL/min (ref >60)
GLUCOSE: 129 mg/dL — AB (ref 70–99)
Potassium: 4.3 mmol/L (ref 3.5–5.1)
Sodium: 134 mmol/L — ABNORMAL LOW (ref 135–145)

## 2018-05-24 MED ORDER — OXYCODONE HCL 5 MG PO TABS: 5.0000 mg | ORAL_TABLET | ORAL | 0 refills | Status: DC | PRN

## 2018-05-24 MED ORDER — METHOCARBAMOL 500 MG PO TABS: 500.0000 mg | ORAL_TABLET | Freq: Four times a day (QID) | ORAL | 0 refills | Status: DC | PRN

## 2018-05-24 MED ORDER — IBUPROFEN 800 MG PO TABS: 800.0000 mg | ORAL_TABLET | Freq: Three times a day (TID) | ORAL | 0 refills | Status: DC | PRN

## 2018-05-24 NOTE — Discharge Instructions (Signed)
[facility] °[phone] ° °MASTECTOMY: POST OP INSTRUCTIONS ° °Always review your discharge instruction sheet given to you by the facility where your surgery was performed. °IF YOU HAVE DISABILITY OR FAMILY LEAVE FORMS, YOU MUST BRING THEM TO THE OFFICE FOR PROCESSING.   °DO NOT GIVE THEM TO YOUR DOCTOR. °A prescription for pain medication may be given to you upon discharge.  Take your pain medication as prescribed, if needed.  If narcotic pain medicine is not needed, then you may take acetaminophen (Tylenol) or ibuprofen (Advil) as needed. °1. Take your usually prescribed medications unless otherwise directed. °2. If you need a refill on your pain medication, please contact your pharmacy.  They will contact our office to request authorization.  Prescriptions will not be filled after 5pm or on week-ends. °3. You should follow a light diet the first few days after arrival home, such as soup and crackers, etc.  Resume your normal diet the day after surgery. °4. Most patients will experience some swelling and bruising on the chest and underarm.  Ice packs will help.  Swelling and bruising can take several days to resolve.  °5. It is common to experience some constipation if taking pain medication after surgery.  Increasing fluid intake and taking a stool softener (such as Colace) will usually help or prevent this problem from occurring.  A mild laxative (Milk of Magnesia or Miralax) should be taken according to package instructions if there are no bowel movements after 48 hours. °6. Unless discharge instructions indicate otherwise, leave your bandage dry and in place until your next appointment in 3-5 days.  You may take a limited sponge bath.  No tube baths or showers until the drains are removed.  You may have steri-strips (small skin tapes) in place directly over the incision.  These strips should be left on the skin for 7-10 days.  If your surgeon used skin glue on the incision, you may  shower in 24 hours.  The glue will flake off over the next 2-3 weeks.  Any sutures or staples will be removed at the office during your follow-up visit. °7. DRAINS:  If you have drains in place, it is important to keep a list of the amount of drainage produced each day in your drains.  Before leaving the hospital, you should be instructed on drain care.  Call our office if you have any questions about your drains. °8. ACTIVITIES:  You may resume regular (light) daily activities beginning the next day--such as daily self-care, walking, climbing stairs--gradually increasing activities as tolerated.  You may have sexual intercourse when it is comfortable.  Refrain from any heavy lifting or straining until approved by your doctor. °a. You may drive when you are no longer taking prescription pain medication, you can comfortably wear a seatbelt, and you can safely maneuver your car and apply brakes. °b. RETURN TO WORK:  __________________________________________________________ °9. You should see your doctor in the office for a follow-up appointment approximately 3-5 days after your surgery.  Your doctor’s nurse will typically make your follow-up appointment when she calls you with your pathology report.  Expect your pathology report 2-3 business days after your surgery.  You may call to check if you do not hear from us after three days.   °10. OTHER INSTRUCTIONS: ______________________________________________________________________________________________ ____________________________________________________________________________________________ °WHEN TO CALL YOUR DOCTOR: °1. Fever over 101.0 °2. Nausea and/or vomiting °3. Extreme swelling or bruising °4. Continued bleeding from incision. °5. Increased pain, redness, or drainage from the incision. °  The clinic staff is available to answer your questions during regular business hours.  Please don’t hesitate to call and ask to speak to one of the nurses for clinical  concerns.  If you have a medical emergency, go to the nearest emergency room or call 911.  A surgeon from [facility] is always on call at the hospital. °[address], [address], [place], [place]  [address] ? [address] [address], [place], [place]   [address] °[phone] ? [phone] ? FAX [phone] °Web site: www.[facility] °[facility] °Surgical drains are used to remove extra fluid that normally builds up in a surgical wound after surgery. A surgical drain helps to heal a surgical wound. Different kinds of surgical drains include: °· Active drains. These drains use suction to pull drainage away from the surgical wound. Drainage flows through a tube to a container outside of the body. It is important to keep the bulb or the drainage container flat (compressed) at all times, except while you empty it. Flattening the bulb or container creates suction. The two most common types of active drains are bulb drains and Hemovac drains. °· Passive drains. These drains allow fluid to drain naturally, by gravity. Drainage flows through a tube to a bandage (dressing) or a container outside of the body. Passive drains do not need to be emptied. The most common type of passive drain is the Penrose drain. °A drain is placed during surgery. Immediately after surgery, drainage is usually bright red and a little thicker than water. The drainage may gradually turn yellow or pink and become thinner. It is likely that your health care provider will remove the drain when the drainage stops or when the amount decreases to 1-2 Tbsp (15-30 mL) during a 24-hour period. °How to care for your surgical drain °It is important to care for your drain to prevent infection. If your drain is placed at your back, or any other hard-to-reach area, ask another person to assist you in performing the following steps: °· Keep the skin around the drain dry and covered with a dressing at all times. °· Check your drain area every  day for signs of infection. Check for: °? More redness, swelling, or pain. °? Pus or a bad smell. °? Cloudy drainage. °Follow instructions from your health care provider about how to take care of your drain and how to change your dressing. Change your dressing at least one time every day. Change it more often if needed to keep the dressing dry. Make sure you: °1. Gather your supplies, including: °? Tape. °? Germ-free cleaning solution (sterile saline). °? Split gauze drain sponge: 4 x 4 inches (10 x 10 cm). °? Gauze square: 4 x 4 inches (10 x 10 cm). °2. Wash your hands with soap and water before you change your dressing. If soap and water are not available, use hand sanitizer. °3. Remove the old dressing. Avoid using scissors to do that. °4. Use sterile saline to clean your skin around the drain. °5. Place the tube through the slit in a drain sponge. Place the drain sponge so that it covers your wound. °6. Place the gauze square or another drain sponge on top of the drain sponge that is on the wound. Make sure the tube is between those layers. °7. Tape the dressing to your skin. °8. If you have an active bulb or Hemovac drain, tape the drainage tube to your skin 1-2 inches (2.5-5 cm) below the place where   the tube enters your body. Taping keeps the tube from pulling on any stitches (sutures) that you have. °9. Wash your hands with soap and water. °10. Write down the color of your drainage and how often you change your dressing. °How to empty your active bulb or Hemovac drain ° °1. Make sure that you have a measuring cup that you can empty your drainage into. °2. Wash your hands with soap and water. If soap and water are not available, use hand sanitizer. °3. Gently move your fingers down the tube while squeezing very lightly. This is called stripping the tube. This clears any drainage, clots, or tissue from the tube. °? Do not pull on the tube. °? You may need to strip the tube several times every day to keep the  tube clear. °4. Open the bulb cap or the drain plug. Do not touch the inside of the cap or the bottom of the plug. °5. Empty all of the drainage into the measuring cup. °6. Compress the bulb or the container and replace the cap or the plug. To compress the bulb or the container, squeeze it firmly in the middle while you close the cap or plug the container. °7. Write down the amount of drainage that you have in each 24-hour period. If you have less than 2 Tbsp (30 mL) of drainage during 24 hours, contact your health care provider. °8. Flush the drainage down the toilet. °9. Wash your hands with soap and water. °Contact a health care provider if: °· You have more redness, swelling, or pain around your drain area. °· The amount of drainage that you have is increasing instead of decreasing. °· You have pus or a bad smell coming from your drain area. °· You have a fever. °· You have drainage that is cloudy. °· There is a sudden stop or a sudden decrease in the amount of drainage that you have. °· Your tube falls out. °· Your active drain does not stay compressed after you empty it. °Summary °· Surgical drains are used to remove extra fluid that normally builds up in a surgical wound after surgery. °· Different kinds of surgical drains include active drains and passive drains. Active drains use suction to pull drainage away from the surgical wound, and passive drains allow fluid to drain naturally. °· It is important to care for your drain to prevent infection. If your drain is placed at your back, or any other hard-to-reach area, ask another person to assist you. °· Contact your health care provider if you have more redness, swelling, or pain around your drain area. °This information is not intended to replace advice given to you by your health care provider. Make sure you discuss any questions you have with your health care provider. °Document Released: 02/13/2000 Document Revised: 03/10/2017 Document Reviewed:  09/04/2014 °Elsevier Interactive Patient Education © 2019 [employer] ° °

## 2018-05-24 NOTE — Discharge Summary (Signed)
Physician Discharge Summary  Patient ID: [name] MRN: [id number] DOB/AGE: 10/11/1950 68 y.o.  Admit date: 05/23/2018 Discharge date: 05/24/2018  Admission Diagnoses:DCIS right breast   Discharge Diagnoses:  Active Problems:   Neoplasm of right breast, primary tumor staging category Tis: ductal carcinoma in situ (DCIS)   Discharged Condition: good  Hospital Course: Pt did well post op.  She ambulated, had good pain control, tolerated her diet  And flaps were viable without hematoma.   Consults: None  Significant Diagnostic Studies: labs:  CBC    Component Value Date/Time   WBC 10.3 05/24/2018 0157   RBC 3.64 (L) 05/24/2018 0157   HGB 10.1 (L) 05/24/2018 0157   HGB 12.6 04/26/2018 1227   HCT 31.6 (L) 05/24/2018 0157   PLT 260 05/24/2018 0157   PLT 317 04/26/2018 1227   MCV 86.8 05/24/2018 0157   MCH 27.7 05/24/2018 0157   MCHC 32.0 05/24/2018 0157   RDW 13.4 05/24/2018 0157   LYMPHSABS 1.9 05/17/2018 1217   MONOABS 0.5 05/17/2018 1217   EOSABS 0.3 05/17/2018 1217   BASOSABS 0.1 05/17/2018 1217    Treatments: surgery: right simple mastectomy with SLN mapping   Discharge Exam: Blood pressure 93/75, pulse 73, temperature 98 F (36.7 C), temperature source Oral, resp. rate 15, height 5' 4.5" (1.638 m), weight 61.5 kg, SpO2 100 %. General appearance: alert and cooperative Head: Normocephalic, without obvious abnormality, atraumatic Resp: clear to auscultation bilaterally Cardio: regular rate and rhythm, S1, S2 normal, no murmur, click, rub or gallop Incision/Wound:CDI no hematoma   Drains with appropriate output flaps viable   Disposition: Discharge disposition: 01-Home or Self Care       Discharge Instructions    Diet - low sodium heart healthy   Complete by:  As directed    Increase activity slowly   Complete by:  As directed      Allergies as of 05/24/2018      Reactions   Lyrica [pregabalin] Other (See Comments)   Extreme dry mouth, dizziness   Tramadol Nausea And Vomiting, Other (See Comments)   Dizziness      Medication List    TAKE these medications   amLODipine 2.5 MG tablet Commonly known as:  NORVASC Take 1 tablet (2.5 mg total) by mouth daily.   ibuprofen 800 MG tablet Commonly known as:  ADVIL,MOTRIN Take 1 tablet (800 mg total) by mouth every 8 (eight) hours as needed.   Krill Oil 1000 MG Caps Take 1,000 mg by mouth daily.   meloxicam 15 MG tablet Commonly known as:  MOBIC Take 1 tablet (15 mg total) by mouth daily.   methocarbamol 500 MG tablet Commonly known as:  ROBAXIN Take 1 tablet (500 mg total) by mouth every 6 (six) hours as needed for muscle spasms.   multivitamin tablet Take 1 tablet by mouth daily.   Olive Leaf Extract 250 MG Caps Take 700 mg by mouth daily.   OVER THE COUNTER MEDICATION Take 1 tablet by mouth daily. tumeric   oxyCODONE 5 MG immediate release tablet Commonly known as:  Oxy IR/ROXICODONE Take 1-2 tablets (5-10 mg total) by mouth every 4 (four) hours as needed for moderate pain.   Vitamin D3 125 MCG (5000 UT) Caps Take 5,000 Units by mouth daily.        Signed: Joyice Faster Romanita Fager 05/24/2018, 8:58 AM

## 2018-05-24 NOTE — Progress Notes (Signed)
Marlow Baars to be D/C'd  per MD order. Discussed with the patient and all questions fully answered.  VSS, Skin clean, dry and intact without evidence of skin break down, no evidence of skin tears noted.  IV catheter discontinued intact. Site without signs and symptoms of complications. Dressing and pressure applied.  An After Visit Summary was printed and given to the patient. Patient received prescription.  D/c education completed with patient/family including follow up instructions, medication list, d/c activities limitations if indicated, with other d/c instructions as indicated by MD - patient able to verbalize understanding, all questions fully answered.   Patient instructed to return to ED, call 911, or call MD for any changes in condition.   Patient to be escorted via [place], and D/C home via private auto.

## 2018-06-20 ENCOUNTER — Telehealth: Payer: Self-pay | Admitting: Physical Therapy

## 2018-06-20 NOTE — Telephone Encounter (Signed)
Phoned pt and she returned my call. We discussed how outpatient rehab is closed except for seeing urgent patients due to Covid 19. She reported that she did not feel she needed to be urgently seen. She said her shoulder ROM is slowly improving and she discussed doing various exercises given pre-operatively by PT that she is doing. She had questions about getting a prosthesis and I referred her back to her MD. We discussed her using knitted knockers temporarily and she reported she had already received those and will plan to use that until she can get a permanent prosthesis. She had no other concerns or questions but knows to contact me if something comes up. [name], [place] 06/20/18 12:41 PM

## 2018-06-26 ENCOUNTER — Encounter: Payer: Federal, State, Local not specified - PPO | Admitting: Physical Therapy

## 2018-07-21 ENCOUNTER — Other Ambulatory Visit (INDEPENDENT_AMBULATORY_CARE_PROVIDER_SITE_OTHER): Payer: Federal, State, Local not specified - PPO

## 2018-07-21 ENCOUNTER — Encounter: Payer: Self-pay | Admitting: Internal Medicine

## 2018-07-21 ENCOUNTER — Other Ambulatory Visit: Payer: Self-pay

## 2018-07-21 ENCOUNTER — Ambulatory Visit (INDEPENDENT_AMBULATORY_CARE_PROVIDER_SITE_OTHER): Payer: Federal, State, Local not specified - PPO | Admitting: Internal Medicine

## 2018-07-21 VITALS — BP 140/88 | HR 85 | Temp 98.2°F | Ht 64.5 in | Wt 133.0 lb

## 2018-07-21 DIAGNOSIS — Z0000 Encounter for general adult medical examination without abnormal findings: Secondary | ICD-10-CM

## 2018-07-21 DIAGNOSIS — D0511 Intraductal carcinoma in situ of right breast: Secondary | ICD-10-CM | POA: Diagnosis not present

## 2018-07-21 LAB — HEPATIC FUNCTION PANEL
ALT: 11 U/L (ref 0–35)
AST: 14 U/L (ref 0–37)
Albumin: 4.2 g/dL (ref 3.5–5.2)
Alkaline Phosphatase: 102 U/L (ref 39–117)
Bilirubin, Direct: 0.1 mg/dL (ref 0.0–0.3)
Total Bilirubin: 0.4 mg/dL (ref 0.2–1.2)
Total Protein: 7 g/dL (ref 6.0–8.3)

## 2018-07-21 LAB — CBC WITH DIFFERENTIAL/PLATELET
Basophils Absolute: 0.1 10*3/uL (ref 0.0–0.1)
Basophils Relative: 1.4 % (ref 0.0–3.0)
Eosinophils Absolute: 0.2 10*3/uL (ref 0.0–0.7)
Eosinophils Relative: 3.3 % (ref 0.0–5.0)
HCT: 37.6 % (ref 36.0–46.0)
Hemoglobin: 12.5 g/dL (ref 12.0–15.0)
Lymphocytes Relative: 35.2 % (ref 12.0–46.0)
Lymphs Abs: 1.9 10*3/uL (ref 0.7–4.0)
MCHC: 33.3 g/dL (ref 30.0–36.0)
MCV: 84.4 fl (ref 78.0–100.0)
Monocytes Absolute: 0.5 10*3/uL (ref 0.1–1.0)
Monocytes Relative: 8.5 % (ref 3.0–12.0)
Neutro Abs: 2.8 10*3/uL (ref 1.4–7.7)
Neutrophils Relative %: 51.6 % (ref 43.0–77.0)
Platelets: 285 10*3/uL (ref 150.0–400.0)
RBC: 4.45 Mil/uL (ref 3.87–5.11)
RDW: 14.1 % (ref 11.5–15.5)
WBC: 5.5 10*3/uL (ref 4.0–10.5)

## 2018-07-21 LAB — LIPID PANEL
Cholesterol: 230 mg/dL — ABNORMAL HIGH (ref 0–200)
HDL: 79 mg/dL (ref >39.00)
LDL Cholesterol: 132 mg/dL — ABNORMAL HIGH (ref 0–99)
NonHDL: 151.11
Total CHOL/HDL Ratio: 3
Triglycerides: 97 mg/dL (ref 0.0–149.0)
VLDL: 19.4 mg/dL (ref 0.0–40.0)

## 2018-07-21 LAB — URINALYSIS
Bilirubin Urine: NEGATIVE
Hgb urine dipstick: NEGATIVE
Ketones, ur: NEGATIVE
Leukocytes,Ua: NEGATIVE
Nitrite: NEGATIVE
Specific Gravity, Urine: 1.02 (ref 1.000–1.030)
Total Protein, Urine: NEGATIVE
Urine Glucose: NEGATIVE
Urobilinogen, UA: 0.2 (ref 0.0–1.0)
pH: 6.5 (ref 5.0–8.0)

## 2018-07-21 LAB — BASIC METABOLIC PANEL
BUN: 11 mg/dL (ref 6–23)
CO2: 29 mEq/L (ref 19–32)
Calcium: 9.4 mg/dL (ref 8.4–10.5)
Chloride: 103 mEq/L (ref 96–112)
Creatinine, Ser: 0.66 mg/dL (ref 0.40–1.20)
GFR: 108 mL/min (ref >60.00)
Glucose, Bld: 89 mg/dL (ref 70–99)
Potassium: 3.5 mEq/L (ref 3.5–5.1)
Sodium: 141 mEq/L (ref 135–145)

## 2018-07-21 LAB — TSH: TSH: 2.13 u[IU]/mL (ref 0.35–4.50)

## 2018-07-21 MED ORDER — AMLODIPINE BESYLATE 2.5 MG PO TABS: 2.5000 mg | ORAL_TABLET | Freq: Every day | ORAL | 3 refills | Status: DC

## 2018-07-21 MED ORDER — MELOXICAM 15 MG PO TABS: 15.0000 mg | ORAL_TABLET | Freq: Every day | ORAL | 1 refills | Status: DC

## 2018-07-21 NOTE — Assessment & Plan Note (Signed)
S/p right simple mastectomy with right axillary sentinel lymph node mapping 3/20 Dr Donella Stade Appt w/Dr Magrinat in 7/20

## 2018-07-21 NOTE — Patient Instructions (Signed)
Health Maintenance for Postmenopausal Women Menopause is a normal process in which your reproductive ability comes to an end. This process happens gradually over a span of months to years, usually between the ages of 62 and 89. Menopause is complete when you have missed 12 consecutive menstrual periods. It is important to talk with your health care provider about some of the most common conditions that affect postmenopausal women, such as heart disease, cancer, and bone loss (osteoporosis). Adopting a healthy lifestyle and getting preventive care can help to promote your health and wellness. Those actions can also lower your chances of developing some of these common conditions. What should I know about menopause? During menopause, you may experience a number of symptoms, such as:  Moderate-to-severe hot flashes.  Night sweats.  Decrease in sex drive.  Mood swings.  Headaches.  Tiredness.  Irritability.  Memory problems.  Insomnia. Choosing to treat or not to treat menopausal changes is an individual decision that you make with your health care provider. What should I know about hormone replacement therapy and supplements? Hormone therapy products are effective for treating symptoms that are associated with menopause, such as hot flashes and night sweats. Hormone replacement carries certain risks, especially as you become older. If you are thinking about using estrogen or estrogen with progestin treatments, discuss the benefits and risks with your health care provider. What should I know about heart disease and stroke? Heart disease, heart attack, and stroke become more likely as you age. This may be due, in part, to the hormonal changes that your body experiences during menopause. These can affect how your body processes dietary fats, triglycerides, and cholesterol. Heart attack and stroke are both medical emergencies. There are many things that you can do to help prevent heart disease  and stroke:  Have your blood pressure checked at least every 1-2 years. High blood pressure causes heart disease and increases the risk of stroke.  If you are 79-72 years old, ask your health care provider if you should take aspirin to prevent a heart attack or a stroke.  Do not use any tobacco products, including cigarettes, chewing tobacco, or electronic cigarettes. If you need help quitting, ask your health care provider.  It is important to eat a healthy diet and maintain a healthy weight. ? Be sure to include plenty of vegetables, fruits, low-fat dairy products, and lean protein. ? Avoid eating foods that are high in solid fats, added sugars, or salt (sodium).  Get regular exercise. This is one of the most important things that you can do for your health. ? Try to exercise for at least 150 minutes each week. The type of exercise that you do should increase your heart rate and make you sweat. This is known as moderate-intensity exercise. ? Try to do strengthening exercises at least twice each week. Do these in addition to the moderate-intensity exercise.  Know your numbers.Ask your health care provider to check your cholesterol and your blood glucose. Continue to have your blood tested as directed by your health care provider.  What should I know about cancer screening? There are several types of cancer. Take the following steps to reduce your risk and to catch any cancer development as early as possible. Breast Cancer  Practice breast self-awareness. ? This means understanding how your breasts normally appear and feel. ? It also means doing regular breast self-exams. Let your health care provider know about any changes, no matter how small.  If you are 40 or  older, have a clinician do a breast exam (clinical breast exam or CBE) every year. Depending on your age, family history, and medical history, it may be recommended that you also have a yearly breast X-ray (mammogram).  If you  have a family history of breast cancer, talk with your health care provider about genetic screening.  If you are at high risk for breast cancer, talk with your health care provider about having an MRI and a mammogram every year.  Breast cancer (BRCA) gene test is recommended for women who have family members with BRCA-related cancers. Results of the assessment will determine the need for genetic counseling and BRCA1 and for BRCA2 testing. BRCA-related cancers include these types: ? Breast. This occurs in males or females. ? Ovarian. ? Tubal. This may also be called fallopian tube cancer. ? Cancer of the abdominal or pelvic lining (peritoneal cancer). ? Prostate. ? Pancreatic. Cervical, Uterine, and Ovarian Cancer Your health care provider may recommend that you be screened regularly for cancer of the pelvic organs. These include your ovaries, uterus, and vagina. This screening involves a pelvic exam, which includes checking for microscopic changes to the surface of your cervix (Pap test).  For women ages 21-65, health care providers may recommend a pelvic exam and a Pap test every three years. For women ages 39-65, they may recommend the Pap test and pelvic exam, combined with testing for human papilloma virus (HPV), every five years. Some types of HPV increase your risk of cervical cancer. Testing for HPV may also be done on women of any age who have unclear Pap test results.  Other health care providers may not recommend any screening for nonpregnant women who are considered low risk for pelvic cancer and have no symptoms. Ask your health care provider if a screening pelvic exam is right for you.  If you have had past treatment for cervical cancer or a condition that could lead to cancer, you need Pap tests and screening for cancer for at least 20 years after your treatment. If Pap tests have been discontinued for you, your risk factors (such as having a new sexual partner) need to be reassessed  to determine if you should start having screenings again. Some women have medical problems that increase the chance of getting cervical cancer. In these cases, your health care provider may recommend that you have screening and Pap tests more often.  If you have a family history of uterine cancer or ovarian cancer, talk with your health care provider about genetic screening.  If you have vaginal bleeding after reaching menopause, tell your health care provider.  There are currently no reliable tests available to screen for ovarian cancer. Lung Cancer Lung cancer screening is recommended for adults 57-50 years old who are at high risk for lung cancer because of a history of smoking. A yearly low-dose CT scan of the lungs is recommended if you:  Currently smoke.  Have a history of at least 30 pack-years of smoking and you currently smoke or have quit within the past 15 years. A pack-year is smoking an average of one pack of cigarettes per day for one year. Yearly screening should:  Continue until it has been 15 years since you quit.  Stop if you develop a health problem that would prevent you from having lung cancer treatment. Colorectal Cancer  This type of cancer can be detected and can often be prevented.  Routine colorectal cancer screening usually begins at age 12 and continues through  age 63.  If you have risk factors for colon cancer, your health care provider may recommend that you be screened at an earlier age.  If you have a family history of colorectal cancer, talk with your health care provider about genetic screening.  Your health care provider may also recommend using home test kits to check for hidden blood in your stool.  A small camera at the end of a tube can be used to examine your colon directly (sigmoidoscopy or colonoscopy). This is done to check for the earliest forms of colorectal cancer.  Direct examination of the colon should be repeated every 5-10 years until  age 75. However, if early forms of precancerous polyps or small growths are found or if you have a family history or genetic risk for colorectal cancer, you may need to be screened more often. Skin Cancer  Check your skin from head to toe regularly.  Monitor any moles. Be sure to tell your health care provider: ? About any new moles or changes in moles, especially if there is a change in a mole's shape or color. ? If you have a mole that is larger than the size of a pencil eraser.  If any of your family members has a history of skin cancer, especially at a young age, talk with your health care provider about genetic screening.  Always use sunscreen. Apply sunscreen liberally and repeatedly throughout the day.  Whenever you are outside, protect yourself by wearing long sleeves, pants, a wide-brimmed hat, and sunglasses. What should I know about osteoporosis? Osteoporosis is a condition in which bone destruction happens more quickly than new bone creation. After menopause, you may be at an increased risk for osteoporosis. To help prevent osteoporosis or the bone fractures that can happen because of osteoporosis, the following is recommended:  If you are 59-59 years old, get at least 1,000 mg of calcium and at least 600 mg of vitamin D per day.  If you are older than age 36 but younger than age 32, get at least 1,200 mg of calcium and at least 600 mg of vitamin D per day.  If you are older than age 47, get at least 1,200 mg of calcium and at least 800 mg of vitamin D per day. Smoking and excessive alcohol intake increase the risk of osteoporosis. Eat foods that are rich in calcium and vitamin D, and do weight-bearing exercises several times each week as directed by your health care provider. What should I know about how menopause affects my mental health? Depression may occur at any age, but it is more common as you become older. Common symptoms of depression include:  Low or sad mood.   Changes in sleep patterns.  Changes in appetite or eating patterns.  Feeling an overall lack of motivation or enjoyment of activities that you previously enjoyed.  Frequent crying spells. Talk with your health care provider if you think that you are experiencing depression. What should I know about immunizations? It is important that you get and maintain your immunizations. These include:  Tetanus, diphtheria, and pertussis (Tdap) booster vaccine.  Influenza every year before the flu season begins.  Pneumonia vaccine.  Shingles vaccine. Your health care provider may also recommend other immunizations. This information is not intended to replace advice given to you by your health care provider. Make sure you discuss any questions you have with your health care provider. Document Released: 04/09/2005 Document Revised: 09/05/2015 Document Reviewed: 11/19/2014 Elsevier Interactive Patient Education  2019 [place].

## 2018-07-21 NOTE — Progress Notes (Signed)
Subjective:  Patient ID: [name], female    DOB: [birth year]  Age: 68 y.o. MRN: [id number]  CC: No chief complaint on file.   HPI [name] presents for a well exam F/u HTN, breast ca, OA   Outpatient Medications Prior to Visit  Medication Sig Dispense Refill  . amLODipine (NORVASC) 2.5 MG tablet Take 1 tablet (2.5 mg total) by mouth daily. 90 tablet 3  . Cholecalciferol (VITAMIN D3) 125 MCG (5000 UT) CAPS Take 5,000 Units by mouth daily.    Javier Docker Oil 1000 MG CAPS Take 1,000 mg by mouth daily.    . meloxicam (MOBIC) 15 MG tablet Take 1 tablet (15 mg total) by mouth daily. 90 tablet 1  . methocarbamol (ROBAXIN) 500 MG tablet Take 1 tablet (500 mg total) by mouth every 6 (six) hours as needed for muscle spasms. 10 tablet 0  . Multiple Vitamin (MULTIVITAMIN) tablet Take 1 tablet by mouth daily.    Jonna Coup Leaf Extract 250 MG CAPS Take 700 mg by mouth daily.    Marland Kitchen OVER THE COUNTER MEDICATION Take 1 tablet by mouth daily. tumeric    . ibuprofen (ADVIL,MOTRIN) 800 MG tablet Take 1 tablet (800 mg total) by mouth every 8 (eight) hours as needed. 30 tablet 0  . oxyCODONE (OXY IR/ROXICODONE) 5 MG immediate release tablet Take 1-2 tablets (5-10 mg total) by mouth every 4 (four) hours as needed for moderate pain. 12 tablet 0   No facility-administered medications prior to visit.     ROS: Review of Systems  Constitutional: Negative for activity change, appetite change, chills, fatigue and unexpected weight change.  HENT: Negative for congestion, mouth sores and sinus pressure.   Eyes: Negative for visual disturbance.  Respiratory: Negative for cough and chest tightness.   Gastrointestinal: Negative for abdominal pain and nausea.  Genitourinary: Negative for difficulty urinating, frequency and vaginal pain.  Musculoskeletal: Negative for back pain and gait problem.  Skin: Negative for pallor and rash.  Neurological: Negative for dizziness, tremors, weakness, numbness and  headaches.  Psychiatric/Behavioral: Negative for confusion and sleep disturbance.    Objective:  BP 140/88 (BP Location: Left Arm, Patient Position: Sitting, Cuff Size: Normal)   Pulse 85   Temp 98.2 F (36.8 C) (Oral)   Ht 5' 4.5" (1.638 m)   Wt 133 lb (60.3 kg)   SpO2 97%   BMI 22.48 kg/m   BP Readings from Last 3 Encounters:  07/21/18 140/88  05/24/18 93/75  05/17/18 (!) 141/88    Wt Readings from Last 3 Encounters:  07/21/18 133 lb (60.3 kg)  05/23/18 135 lb 8 oz (61.5 kg)  05/17/18 135 lb 8 oz (61.5 kg)    Physical Exam Constitutional:      General: She is not in acute distress.    Appearance: She is well-developed.  HENT:     Head: Normocephalic.     Right Ear: External ear normal.     Left Ear: External ear normal.     Nose: Nose normal.  Eyes:     General:        Right eye: No discharge.        Left eye: No discharge.     Conjunctiva/sclera: Conjunctivae normal.     Pupils: Pupils are equal, round, and reactive to light.  Neck:     Musculoskeletal: Normal range of motion and neck supple.     Thyroid: No thyromegaly.     Vascular: No JVD.     Trachea:  No tracheal deviation.  Cardiovascular:     Rate and Rhythm: Normal rate and regular rhythm.     Heart sounds: Normal heart sounds.  Pulmonary:     Effort: No respiratory distress.     Breath sounds: No stridor. No wheezing.  Abdominal:     General: Bowel sounds are normal. There is no distension.     Palpations: Abdomen is soft. There is no mass.     Tenderness: There is no abdominal tenderness. There is no guarding or rebound.  Musculoskeletal:        General: No tenderness.  Lymphadenopathy:     Cervical: No cervical adenopathy.  Skin:    Findings: No erythema or rash.  Neurological:     Cranial Nerves: No cranial nerve deficit.     Motor: No abnormal muscle tone.     Coordination: Coordination normal.     Deep Tendon Reflexes: Reflexes normal.  Psychiatric:        Behavior: Behavior normal.         Thought Content: Thought content normal.        Judgment: Judgment normal.     Lab Results  Component Value Date   WBC 10.3 05/24/2018   HGB 10.1 (L) 05/24/2018   HCT 31.6 (L) 05/24/2018   PLT 260 05/24/2018   GLUCOSE 129 (H) 05/24/2018   CHOL 207 (H) 06/20/2017   TRIG 73.0 06/20/2017   HDL 81.80 06/20/2017   LDLDIRECT 90.1 03/22/2012   LDLCALC 110 (H) 06/20/2017   ALT 16 05/17/2018   AST 17 05/17/2018   NA 134 (L) 05/24/2018   K 4.3 05/24/2018   CL 106 05/24/2018   CREATININE 0.67 05/24/2018   BUN 10 05/24/2018   CO2 23 05/24/2018   TSH 1.54 06/20/2017    Nm Sentinel Node Inj-no Rpt (breast)  Result Date: 05/23/2018 Sulfur colloid was injected by the nuclear medicine technologist for melanoma sentinel node.    Assessment & Plan:   There are no diagnoses linked to this encounter.   No orders of the defined types were placed in this encounter.    Follow-up: No follow-ups on file.  Walker Kehr, MD

## 2018-07-21 NOTE — Assessment & Plan Note (Signed)
Here for medicare wellness/physical  Diet: heart healthy  Physical activity: not sedentary  Depression/mood screen: negative  Hearing: intact to whispered voice  Visual acuity: grossly normal w/contacts, performs annual eye exam  ADLs: capable  Fall risk: low to none  Home safety: good  Cognitive evaluation: intact to orientation, naming, recall and repetition  EOL planning: adv directives, full code/ I agree  I have personally reviewed and have noted  1. The patient's medical, surgical and social history  2. Their use of alcohol, tobacco or illicit drugs  3. Their current medications and supplements  4. The patient's functional ability including ADL's, fall risks, home safety risks and hearing or visual impairment.  5. Diet and physical activities  6. Evidence for depression or mood disorders 7. The roster of all physicians providing medical care to patient - is listed in the Snapshot section of the chart and reviewed today.    Today patient counseled on age appropriate routine health concerns for screening and prevention, each reviewed and up to date or declined. Immunizations reviewed and up to date or declined. Labs ordered and reviewed. Risk factors for depression reviewed and negative. Hearing function and visual acuity are intact. ADLs screened and addressed as needed. Functional ability and level of safety reviewed and appropriate. Education, counseling and referrals performed based on assessed risks today. Patient provided with a copy of personalized plan for preventive services.   Prevnar PAP/mammo/DEXA - Dr Gertie Fey Colon 2015, due 2020 Dr Fuller Plan

## 2018-09-12 ENCOUNTER — Ambulatory Visit: Payer: Federal, State, Local not specified - PPO | Admitting: Oncology

## 2018-09-24 NOTE — Progress Notes (Signed)
[place]  Telephone:(336) [phone] Fax:(336) [phone]     ID: [name] DOB: [birth year]  MR#: [id number]  GQB#:[id number]  Patient Care Team: Cassandria Anger, MD as PCP - General Everlene Farrier, MD as Attending Physician (Obstetrics and Gynecology) Ladene Artist, MD (Gastroenterology) Mauro Kaufmann, RN as Oncology Nurse Navigator Rockwell Germany, RN as Oncology Nurse Navigator Erroll Luna, MD as Consulting Physician (General Surgery) Shevonne Wolf, Virgie Dad, MD as Consulting Physician (Oncology) Kyung Rudd, MD as Consulting Physician (Radiation Oncology) Chauncey Cruel, MD OTHER MD:  CHIEF COMPLAINT: Ductal carcinoma in situ, estrogen receptor negative  CURRENT TREATMENT: Prophylactic tamoxifen   INTERVAL HISTORY: [name] returns today for follow up of her noninvasive breast cancer.  Since her last visit, she underwent genetics counseling on 04/26/2018. Results from this were negative.  She proceeded with right mastectomy on 05/23/2018 under Dr. Brantley Stage. Pathology from the procedure [id number]) revealed: two foci of microinvasive ductal carcinoma, grade 2, arising in a background of high grade DCIS with necrosis; the largest foci of invasive measures 1 mm, DCIS spans about 5.5 cm; resection margins negative; no evidence of lymphovascular or perineural invasion; DCIS focally involves lactiferous ducts in the nipple.   REVIEW OF SYSTEMS: [name] reports continues soreness since surgery and tightness to the upper right chest. She also reports occasional pains. Because of the virus, she did not attend physical therapy. She saw Dr. Brantley Stage about 10 days ago, and she notes he wrote her a prescription for prosthetic bras as she is not interested in reconstruction. At home is her and her husband, both are retired. A detailed review of systems was otherwise entirely negative.    HISTORY OF CURRENT ILLNESS: [name] had routine screening  mammography on 04/03/2018 showing a possible abnormality in the right breast. She underwent bilateral diagnostic mammography with tomography and right breast ultrasonography at [facility] on 04/13/2018 showing: a 5.6 cm group of suspicious pleomorphic calcifications in the upper-outer quadrant of the right breast; in this area sonographically, there are 2 small suspicious subcentimeter masses which lie 2 cm apart; no evidence of right axillary lymphadenopathy.   Accordingly on 04/14/2018 she proceeded to biopsy of the 10 o'clock and 11 o'clock regions of the right breast. The pathology ([id number]) from this procedure showed: ductal carcinoma in situ in both cores, high nuclear grade, 0.4 cm. Prognostic indicators significant for: estrogen receptor, 0% negative and progesterone receptor, 0% negative.   Additional biopsies were performed on 04/20/2018 of the retroareolar region and upper-outer quadrant. Pathology [id number]) from this procedure confirmed ductal carcinoma in situ, high grade, with calcifications and necrosis.  A prognostic panel was not repeated  The patient's subsequent history is as detailed below.   PAST MEDICAL HISTORY: Past Medical History:  Diagnosis Date  . ANEMIA-NOS 11/07/2007  . Cancer ([place])    Breast  . Family history of breast cancer   . Family history of ovarian cancer   . HIP PAIN 02/06/2010  . HYPERGLYCEMIA 02/06/2010  . HYPERTENSION 11/07/2007  . OSTEOARTHRITIS 11/07/2007  . URI 08/08/2009  Seasonal allergies   PAST SURGICAL HISTORY: Past Surgical History:  Procedure Laterality Date  . COLONOSCOPY    . SIMPLE MASTECTOMY Right 05/23/2018   SENTINAL NODE MAPPING   . SIMPLE MASTECTOMY WITH AXILLARY SENTINEL NODE BIOPSY Right 05/23/2018   Procedure: RIGHT SIMPLE MASTECTOMY WITH  RIGHT SENTINEL NODE MAPPING;  Surgeon: Erroll Luna, MD;  Location: [facility];  Service: General;  Laterality: Right;  Marland Kitchen VAGINAL HYSTERECTOMY  03/12/2008   with BSO    FAMILY HISTORY  Family History  Problem Relation Age of Onset  . Cancer Father        mesothelioma  . Diabetes Mother   . Hypertension Mother   . Hypertension Other   . Breast cancer Paternal Aunt 8  . Ovarian cancer Maternal Aunt        late 42s  . Lupus Daughter   . Colon cancer Neg Hx   . Prostate cancer Neg Hx    Patient father was 30 years old when he died from mesothelioma. Patient mother is alive at age 92. Patient notes a family hx of breast and ovarian cancer. She reports a paternal aunt with breast cancer at age 66. She also reports a maternal aunt with ovarian cancer in her late 27s. She has 13 siblings, 8 brothers and 5 sisters.   GYNECOLOGIC HISTORY:  No LMP recorded. Patient has had a hysterectomy. Menarche: 68 years old Age at first live birth: 68 years old [place] P 3 LMP 11/2006 Contraceptive/Hormone shots: used from 1972-1975 HRT never used  Hysterectomy? Yes, 03/19/2008 BSO? 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 She is married. Husband [name] is retired from working with the post office. Daughter [name], age 57, lives in [place], [place] as a retired Nature conservation officer (disabled; she has lupus). Son [name], age 59, lives in [place], [place] as a Sports administrator. Son [name] also lives in [place], [place] as a Music therapist. She attends [employer]. She has 1 grandchild who is under a year old.    ADVANCED DIRECTIVES: Living will in place; husband [name] is HCPOA.   HEALTH MAINTENANCE: Social History   Tobacco Use  . Smoking status: Former Smoker    Quit date: 03/01/1973    Years since quitting: 45.6  . Smokeless tobacco: Never Used  Substance Use Topics  . Alcohol use: No  . Drug use: No     Colonoscopy: 06/21/2013, normal, Dr. Fuller Plan, repeat in 5 years  PAP: 03/2017, per patient  Bone density: 2018, osteopenia, per patient   Allergies  Allergen Reactions  . Lyrica [Pregabalin] Other (See Comments)    Extreme dry  mouth, dizziness  . Tramadol Nausea And Vomiting and Other (See Comments)    Dizziness    Current Outpatient Medications  Medication Sig Dispense Refill  . amLODipine (NORVASC) 2.5 MG tablet Take 1 tablet (2.5 mg total) by mouth daily. 90 tablet 3  . Cholecalciferol (VITAMIN D3) 125 MCG (5000 UT) CAPS Take 5,000 Units by mouth daily.    Javier Docker Oil 1000 MG CAPS Take 1,000 mg by mouth daily.    . meloxicam (MOBIC) 15 MG tablet Take 1 tablet (15 mg total) by mouth daily. 90 tablet 1  . Multiple Vitamin (MULTIVITAMIN) tablet Take 1 tablet by mouth daily.    Jonna Coup Leaf Extract 250 MG CAPS Take 700 mg by mouth daily.    Marland Kitchen OVER THE COUNTER MEDICATION Take 1 tablet by mouth daily. tumeric     No current facility-administered medications for this visit.     OBJECTIVE: Middle-aged African-American woman in no acute distress  Vitals:   09/25/18 1320  BP: (!) 146/85  Pulse: 98  Resp: 18  Temp: 98.5 F (36.9 C)  SpO2: 98%     Body mass index is 22.32 kg/m.   Wt Readings from Last 3 Encounters:  09/25/18 132 lb 1.6 oz (  59.9 kg)  07/21/18 133 lb (60.3 kg)  05/23/18 135 lb 8 oz (61.5 kg)      ECOG FS:1 - Symptomatic but completely ambulatory  Sclerae unicteric, EOMs intact Wearing a mask No cervical or supraclavicular adenopathy Lungs no rales or rhonchi Heart regular rate and rhythm Abd soft, nontender, positive bowel sounds MSK no focal spinal tenderness, no upper extremity lymphedema, good range of motion right upper extremity Neuro: nonfocal, well oriented, appropriate affect Breasts: The right breast is status post mastectomy.  The chest wall incision has healed well, with no dehiscence, erythema, or swelling, though the incision is somewhat irregular.   LAB RESULTS:  CMP     Component Value Date/Time   NA 141 07/21/2018 1348   K 3.5 07/21/2018 1348   CL 103 07/21/2018 1348   CO2 29 07/21/2018 1348   GLUCOSE 89 07/21/2018 1348   BUN 11 07/21/2018 1348   CREATININE  0.66 07/21/2018 1348   CREATININE 0.83 04/26/2018 1227   CALCIUM 9.4 07/21/2018 1348   PROT 7.0 07/21/2018 1348   ALBUMIN 4.2 07/21/2018 1348   AST 14 07/21/2018 1348   AST 17 04/26/2018 1227   ALT 11 07/21/2018 1348   ALT 13 04/26/2018 1227   ALKPHOS 102 07/21/2018 1348   BILITOT 0.4 07/21/2018 1348   BILITOT 0.5 04/26/2018 1227   GFRNONAA >60 05/24/2018 0157   GFRNONAA >60 04/26/2018 1227   GFRAA >60 05/24/2018 0157   GFRAA >60 04/26/2018 1227    No results found for: TOTALPROTELP, ALBUMINELP, A1GS, A2GS, BETS, BETA2SER, GAMS, MSPIKE, SPEI  No results found for: KPAFRELGTCHN, LAMBDASER, KAPLAMBRATIO  Lab Results  Component Value Date   WBC 5.5 07/21/2018   NEUTROABS 2.8 07/21/2018   HGB 12.5 07/21/2018   HCT 37.6 07/21/2018   MCV 84.4 07/21/2018   PLT 285.0 07/21/2018    @LASTCHEMISTRY @  No results found for: LABCA2  No components found for: WUJWJX914  No results for input(s): INR in the last 168 hours.  No results found for: LABCA2  No results found for: NWG956  No results found for: OZH086  No results found for: VHQ469  No results found for: CA2729  No components found for: HGQUANT  No results found for: CEA1 / No results found for: CEA1   No results found for: AFPTUMOR  No results found for: CHROMOGRNA  No results found for: PSA1  No visits with results within 3 Day(s) from this visit.  Latest known visit with results is:  Appointment on 07/21/2018  Component Date Value Ref Range Status  . Total Bilirubin 07/21/2018 0.4  0.2 - 1.2 mg/dL Final  . Bilirubin, Direct 07/21/2018 0.1  0.0 - 0.3 mg/dL Final  . Alkaline Phosphatase 07/21/2018 102  39 - 117 U/L Final  . AST 07/21/2018 14  0 - 37 U/L Final  . ALT 07/21/2018 11  0 - 35 U/L Final  . Total Protein 07/21/2018 7.0  6.0 - 8.3 g/dL Final  . Albumin 07/21/2018 4.2  3.5 - 5.2 g/dL Final  . Sodium 07/21/2018 141  135 - 145 mEq/L Final  . Potassium 07/21/2018 3.5  3.5 - 5.1 mEq/L Final  .  Chloride 07/21/2018 103  96 - 112 mEq/L Final  . CO2 07/21/2018 29  19 - 32 mEq/L Final  . Glucose, Bld 07/21/2018 89  70 - 99 mg/dL Final  . BUN 07/21/2018 11  6 - 23 mg/dL Final  . Creatinine, Ser 07/21/2018 0.66  0.40 - 1.20 mg/dL Final  .  Calcium 07/21/2018 9.4  8.4 - 10.5 mg/dL Final  . GFR 07/21/2018 108.00  >60.00 mL/min Final  . Cholesterol 07/21/2018 230* 0 - 200 mg/dL Final   ATP III Classification       Desirable:  < 200 mg/dL               Borderline High:  200 - 239 mg/dL          High:  > = 240 mg/dL  . Triglycerides 07/21/2018 97.0  0.0 - 149.0 mg/dL Final   Normal:  <150 mg/dLBorderline High:  150 - 199 mg/dL  . HDL 07/21/2018 79.00  >39.00 mg/dL Final  . VLDL 07/21/2018 19.4  0.0 - 40.0 mg/dL Final  . LDL Cholesterol 07/21/2018 132* 0 - 99 mg/dL Final  . Total CHOL/HDL Ratio 07/21/2018 3   Final                  Men          Women1/2 Average Risk     3.4          3.3Average Risk          5.0          4.42X Average Risk          9.6          7.13X Average Risk          15.0          11.0                      . NonHDL 07/21/2018 151.11   Final   NOTE:  Non-HDL goal should be 30 mg/dL higher than patient's LDL goal (i.e. LDL goal of < 70 mg/dL, would have non-HDL goal of < 100 mg/dL)  . WBC 07/21/2018 5.5  4.0 - 10.5 K/uL Final  . RBC 07/21/2018 4.45  3.87 - 5.11 Mil/uL Final  . Hemoglobin 07/21/2018 12.5  12.0 - 15.0 g/dL Final  . HCT 07/21/2018 37.6  36.0 - 46.0 % Final  . MCV 07/21/2018 84.4  78.0 - 100.0 fl Final  . MCHC 07/21/2018 33.3  30.0 - 36.0 g/dL Final  . RDW 07/21/2018 14.1  11.5 - 15.5 % Final  . Platelets 07/21/2018 285.0  150.0 - 400.0 K/uL Final  . Neutrophils Relative % 07/21/2018 51.6  43.0 - 77.0 % Final  . Lymphocytes Relative 07/21/2018 35.2  12.0 - 46.0 % Final  . Monocytes Relative 07/21/2018 8.5  3.0 - 12.0 % Final  . Eosinophils Relative 07/21/2018 3.3  0.0 - 5.0 % Final  . Basophils Relative 07/21/2018 1.4  0.0 - 3.0 % Final  . Neutro Abs  07/21/2018 2.8  1.4 - 7.7 K/uL Final  . Lymphs Abs 07/21/2018 1.9  0.7 - 4.0 K/uL Final  . Monocytes Absolute 07/21/2018 0.5  0.1 - 1.0 K/uL Final  . Eosinophils Absolute 07/21/2018 0.2  0.0 - 0.7 K/uL Final  . Basophils Absolute 07/21/2018 0.1  0.0 - 0.1 K/uL Final  . Color, Urine 07/21/2018 YELLOW  Yellow;Lt. Yellow;Straw;Dark Yellow;Amber;Green;Red;Brown Final  . APPearance 07/21/2018 CLEAR  Clear;Turbid;Slightly Cloudy;Cloudy Final  . Specific Gravity, Urine 07/21/2018 1.020  1.000 - 1.030 Final  . pH 07/21/2018 6.5  5.0 - 8.0 Final  . Total Protein, Urine 07/21/2018 NEGATIVE  Negative Final  . Urine Glucose 07/21/2018 NEGATIVE  Negative Final  . Ketones, ur 07/21/2018 NEGATIVE  Negative Final  . Bilirubin Urine 07/21/2018 NEGATIVE  Negative  Final  . Hgb urine dipstick 07/21/2018 NEGATIVE  Negative Final  . Urobilinogen, UA 07/21/2018 0.2  0.0 - 1.0 Final  . Leukocytes,Ua 07/21/2018 NEGATIVE  Negative Final  . Nitrite 07/21/2018 NEGATIVE  Negative Final  . TSH 07/21/2018 2.13  0.35 - 4.50 uIU/mL Final    (this displays the last labs from the last 3 days)  No results found for: TOTALPROTELP, ALBUMINELP, A1GS, A2GS, BETS, BETA2SER, GAMS, MSPIKE, SPEI (this displays SPEP labs)  No results found for: KPAFRELGTCHN, LAMBDASER, KAPLAMBRATIO (kappa/lambda light chains)  No results found for: HGBA, HGBA2QUANT, HGBFQUANT, HGBSQUAN (Hemoglobinopathy evaluation)   No results found for: LDH  No results found for: IRON, TIBC, IRONPCTSAT (Iron and TIBC)  No results found for: FERRITIN  Urinalysis    Component Value Date/Time   COLORURINE YELLOW 07/21/2018 [place] 07/21/2018 1348   LABSPEC 1.020 07/21/2018 1348   PHURINE 6.5 07/21/2018 [place] 07/21/2018 [place] 07/21/2018 [place] 07/21/2018 [place] 07/21/2018 1348   UROBILINOGEN 0.2 07/21/2018 1348   NITRITE NEGATIVE 07/21/2018 1348    LEUKOCYTESUR NEGATIVE 07/21/2018 1348     STUDIES: No results found.  ELIGIBLE FOR AVAILABLE RESEARCH PROTOCOL: no  ASSESSMENT: 68 y.o. [place] woman status post multiple (4) biopsies from the right breast, all showing ductal carcinoma in situ, high-grade, estrogen and progesterone receptor negative  (1) status post right mastectomy and sentinel lymph node sampling 05/23/2018 for a pT1(mic) pN0, stage IA invasive ductal carcinoma, grade 2, in the setting of extensive ductal carcinoma in situ, with negative margins  (a) insufficient invasive material for prognostic profiling  (2) no adjuvant therapy needed  (3) genetics testing 05/08/2018 through the  [facility] Breast Cancer STAT Panel + Common Hereditary Cancers Panel found no deleterious mutations in ATM, BRCA1, BRCA2, CDH1, CHEK2, PALB2, PTEN, STK11 and TP53.  The Common Hereditary Cancers Panel offered by [facility] includes sequencing and/or deletion duplication testing of the following 47 genes: APC, ATM, AXIN2, BARD1, BMPR1A, BRCA1, BRCA2, BRIP1, CDH1, CDKN2A (p14ARF), CDKN2A (p16INK4a), CKD4, CHEK2, CTNNA1, DICER1, EPCAM (Deletion/duplication testing only), GREM1 (promoter region deletion/duplication testing only), KIT, MEN1, MLH1, MSH2, MSH3, MSH6, MUTYH, NBN, NF1, NHTL1, PALB2, PDGFRA, PMS2, POLD1, POLE, PTEN, RAD50, RAD51C, RAD51D, SDHB, SDHC, SDHD, SMAD4, SMARCA4. STK11, TP53, TSC1, TSC2, and VHL.  The following genes were evaluated for sequence changes only: SDHA and HOXB13 c.251G>A variant only. The report date is 05/08/2018.  (4) tamoxifen started prophylactically or 09/25/2018  PLAN: I gave Princella a copy of her pathology report and we discussed that in detail.  She understands most of what we found in the breast was in situ breast cancer and this is essentially cured by mastectomy.  With negative margins it requires no adjuvant treatment and she is done with that portion of the disease.  She did have 2 foci of microscopic invasive  disease.  She understands this means in 2 small spots the cancer had figured out how to make a hole in the duct and come out.  The chance of these very small foci having already spread outside the breast is very small although not 0--I quoted her a risk of approximately 2%--and therefore her chance of cure with surgery alone while not 100% is close enough that no further adjuvant treatment is needed.  She does have a risk of developing breast cancer in the contralateral breast.  That risk is approximately 1/2 %/year.  If she lives 30 years  that means she has a 15% chance of developing breast cancer and an 85% of not developing breast cancer.  If she chooses to take antiestrogens she can cut that risk in half, safe from 14% to 7%.  This means 93% of women like her I would not get any benefit from antiestrogens but 7% would avoid a second breast cancer.  She also understands that developing a second breast cancer does not mean that she would die from that breast cancer.  The chance of cure would be high since she is going to continue to be monitored closely with yearly mammograms and exams  We then discussed the possible toxicities side effects and complications of both anastrozole and tamoxifen.  She is interested in giving tamoxifen a try.  I have gone ahead and put in her prescription and we will check in 3 months through virtual visit on how she is doing.  If she tolerates tamoxifen well the plan will be to continue it for 5 years.  At that visit also we will discuss whether she is interested in starting Estring.  She is not interested in reconstruction.  I directed her to wear to find the prostheses and bras that were already prescribed by Dr. Brantley Stage.  I am also referring her to rehab to further work on the right upper extremity although she herself is already doing a good job in that regard  She knows to call for any other issue that may develop before the next visit.   Chauncey Cruel, MD    09/25/2018 2:01 PM Medical Oncology and Hematology [facility] [address] [place], [place] [address] Tel. [phone]    Fax. [phone]   This document serves as a record of services personally performed by Lurline Del, MD. It was created on his behalf by Wilburn Mylar, a trained medical scribe. The creation of this record is based on the scribe's personal observations and the provider's statements to them.   I, Lurline Del MD, have reviewed the above documentation for accuracy and completeness, and I agree with the above.

## 2018-09-25 ENCOUNTER — Other Ambulatory Visit: Payer: Self-pay

## 2018-09-25 ENCOUNTER — Inpatient Hospital Stay: Payer: Federal, State, Local not specified - PPO | Attending: Oncology | Admitting: Oncology

## 2018-09-25 VITALS — BP 146/85 | HR 98 | Temp 98.5°F | Resp 18 | Ht 64.5 in | Wt 132.1 lb

## 2018-09-25 DIAGNOSIS — Z87891 Personal history of nicotine dependence: Secondary | ICD-10-CM | POA: Diagnosis not present

## 2018-09-25 DIAGNOSIS — Z803 Family history of malignant neoplasm of breast: Secondary | ICD-10-CM | POA: Insufficient documentation

## 2018-09-25 DIAGNOSIS — Z8249 Family history of ischemic heart disease and other diseases of the circulatory system: Secondary | ICD-10-CM | POA: Diagnosis not present

## 2018-09-25 DIAGNOSIS — D0511 Intraductal carcinoma in situ of right breast: Secondary | ICD-10-CM | POA: Insufficient documentation

## 2018-09-25 DIAGNOSIS — Z79811 Long term (current) use of aromatase inhibitors: Secondary | ICD-10-CM | POA: Diagnosis not present

## 2018-09-25 DIAGNOSIS — Z791 Long term (current) use of non-steroidal anti-inflammatories (NSAID): Secondary | ICD-10-CM | POA: Insufficient documentation

## 2018-09-25 DIAGNOSIS — M1991 Primary osteoarthritis, unspecified site: Secondary | ICD-10-CM

## 2018-09-25 DIAGNOSIS — M8580 Other specified disorders of bone density and structure, unspecified site: Secondary | ICD-10-CM | POA: Insufficient documentation

## 2018-09-25 DIAGNOSIS — I10 Essential (primary) hypertension: Secondary | ICD-10-CM

## 2018-09-25 MED ORDER — TAMOXIFEN CITRATE 20 MG PO TABS: 20.0000 mg | ORAL_TABLET | Freq: Every day | ORAL | 12 refills | Status: AC

## 2018-09-28 ENCOUNTER — Telehealth: Payer: Self-pay | Admitting: Oncology

## 2018-09-28 ENCOUNTER — Encounter: Payer: Self-pay | Admitting: *Deleted

## 2018-09-28 NOTE — Telephone Encounter (Signed)
Scheduled appt per 7/30 sch message for SCP in Jan 2021 - pt to get an updated schedule next visit.

## 2018-10-02 ENCOUNTER — Ambulatory Visit: Payer: Federal, State, Local not specified - PPO | Attending: Oncology | Admitting: Rehabilitation

## 2018-10-02 ENCOUNTER — Other Ambulatory Visit: Payer: Self-pay

## 2018-10-02 ENCOUNTER — Encounter: Payer: Self-pay | Admitting: Rehabilitation

## 2018-10-02 DIAGNOSIS — R208 Other disturbances of skin sensation: Secondary | ICD-10-CM | POA: Diagnosis present

## 2018-10-02 DIAGNOSIS — R293 Abnormal posture: Secondary | ICD-10-CM

## 2018-10-02 NOTE — Patient Instructions (Signed)
Single arm doorway stretch 20seconds x 3 1x per day Open book stretch 10 seconds x 6 1x per day Self scar massage

## 2018-10-02 NOTE — Therapy (Addendum)
[place], [place], [address] Phone: [phone]   Fax:  [phone]  Physical Therapy Evaluation  Patient Details  Name: [name] MRN: [id number] Date of Birth: 11/14/1950 Referring Provider (PT): Dr. Jana Hakim   Encounter Date: 10/02/2018  PT End of Session - 10/02/18 1210    Visit Number  1    Number of Visits  2    Date for PT Re-Evaluation  10/30/18    PT Start Time  1130    PT Stop Time  1205    PT Time Calculation (min)  35 min    Activity Tolerance  Patient tolerated treatment well    Behavior During Therapy  [facility] for tasks assessed/performed       Past Medical History:  Diagnosis Date  . ANEMIA-NOS 11/07/2007  . Cancer ([place])    Breast  . Family history of breast cancer   . Family history of ovarian cancer   . HIP PAIN 02/06/2010  . HYPERGLYCEMIA 02/06/2010  . HYPERTENSION 11/07/2007  . OSTEOARTHRITIS 11/07/2007  . URI 08/08/2009    Past Surgical History:  Procedure Laterality Date  . COLONOSCOPY    . SIMPLE MASTECTOMY Right 05/23/2018   SENTINAL NODE MAPPING   . SIMPLE MASTECTOMY WITH AXILLARY SENTINEL NODE BIOPSY Right 05/23/2018   Procedure: RIGHT SIMPLE MASTECTOMY WITH  RIGHT SENTINEL NODE MAPPING;  Surgeon: Erroll Luna, MD;  Location: [place];  Service: General;  Laterality: Right;  Marland Kitchen VAGINAL HYSTERECTOMY  03/12/2008   with BSO    There were no vitals filed for this visit.   Subjective Assessment - 10/02/18 1126    Subjective  I am doing well.  Occasional pain and tingling. Some tightness in chest    Pertinent History  Rt mastectomy and SLNB 2 nodes removed per pt on 05/23/18 by Dr. Brantley Stage due to DCIS with nodes negative.  Not getting reconstruction and no other treatment needed.  Other history includes: OA, HTN, and Anemia    Limitations  Other (comment)   none   Patient Stated Goals  decrease chest tightness    Currently in Pain?  No/denies         [facility] PT  Assessment - 10/02/18 0001      Assessment   Medical Diagnosis  Right breast cancer    Referring Provider (PT)  Dr. Jana Hakim    Onset Date/Surgical Date  05/23/18    Hand Dominance  Right    Prior Therapy  n      Precautions   Precaution Comments  lymphedema      Restrictions   Weight Bearing Restrictions  No      Balance Screen   Has the patient fallen in the past 6 months  No    Has the patient had a decrease in activity level because of a fear of falling?   No    Is the patient reluctant to leave their home because of a fear of falling?   No      Home Environment   Living Environment  Private residence    Living Arrangements  Spouse/significant other    Available Help at Discharge  Family      Prior Function   Level of [place]  Retired    Leisure  needs to start back with hre walking, ordered a bicycle      Cognition   Overall Cognitive Status  Within Functional Limits for tasks assessed  Observation/Other Assessments   Observations  well healed mastectomy incision with no edema other abnormalities present      Sensation   Additional Comments  no nubmness      Coordination   Gross Motor Movements are Fluid and Coordinated  Yes      Posture/Postural Control   Posture/Postural Control  Postural limitations    Postural Limitations  Rounded Shoulders;Forward head      AROM   Right Shoulder Extension  65 Degrees    Right Shoulder Flexion  165 Degrees   pull in chest   Right Shoulder ABduction  165 Degrees   slight pull    Right Shoulder Internal Rotation  70 Degrees    Right Shoulder External Rotation  70 Degrees    Left Shoulder Extension  60 Degrees    Left Shoulder Flexion  165 Degrees    Left Shoulder ABduction  165 Degrees    Left Shoulder Internal Rotation  75 Degrees    Left Shoulder External Rotation  72 Degrees      Strength   Overall Strength Comments  bilateral 4+/5 all and no pain      Palpation   Palpation  comment  one section of pectoralis tightness evident with PROM / AROM overhead flexion and abduction              Quick Dash - 10/02/18 0001    Open a tight or new jar  No difficulty    Do heavy household chores (wash walls, wash floors)  No difficulty    Carry a shopping bag or briefcase  No difficulty    Wash your back  No difficulty    Use a knife to cut food  No difficulty    Recreational activities in which you take some force or impact through your arm, shoulder, or hand (golf, hammering, tennis)  No difficulty    During the past week, to what extent has your arm, shoulder or hand problem interfered with your normal social activities with family, friends, neighbors, or groups?  Not at all    During the past week, to what extent has your arm, shoulder or hand problem limited your work or other regular daily activities  Not at all    Arm, shoulder, or hand pain.  None    Tingling (pins and needles) in your arm, shoulder, or hand  None    Difficulty Sleeping  No difficulty    DASH Score  0 %        Objective measurements completed on examination: See above findings.      [place] Adult PT Treatment/Exercise - 10/02/18 0001      Exercises   Exercises  Other Exercises    Other Exercises   doorway stretch single arm and open book straight arm given      Manual Therapy   Manual Therapy  Soft tissue mobilization;Passive ROM    Manual therapy comments  education on self scar massage    Soft tissue mobilization  to the Rt chest into the pectoralis in netural and overhead and ABD/ER positions    Passive ROM  to the Rt shoulder to tolerance             PT Education - 10/02/18 1210    Education Details  HEP    Person(s) Educated  Patient    Methods  Explanation;Demonstration    Comprehension  Verbalized understanding;Returned demonstration;Verbal cues required;Tactile cues required  PT Long Term Goals - 10/02/18 1215      PT LONG TERM GOAL #1   Title   Patient will demonstrate she has regainde full shoulder ROM and fucntion post operatively compared to baseline assessments.    Status  Achieved      PT LONG TERM GOAL #2   Title  Pt will be ind with self care stretches and scar massage to eliminate tightness in the chest    Time  4    Period  Weeks    Status  New             Plan - 10/02/18 1211    Clinical Impression Statement  Pt returns to PT today for check up following surgery.  Unable to come sooner due to COVID.  Pt is doing excellently with no changes and even improvements in AROM on the Rt, MMT strong and equal between sides, and no limitations.  The only limitation being slight pull into the pectoralis with overhead end range flexion and abduction and as pt reports with sitting still for along time.  Performed some STM tothe Rt pectoralis in the clinic with decreased/eliminated pull.  Also educated pt on stretches.  Due to COVID pt would like to try stretching only and self massage but is agreeable to return if needed.    Personal Factors and Comorbidities  Comorbidity 1    Comorbidities  surgical status    Stability/Clinical Decision Making  Stable/Uncomplicated    Clinical Decision Making  Low    Rehab Potential  Excellent    PT Frequency  Other (comment)   F/U as needed   PT Treatment/Interventions  ADLs/Self Care Home Management;Patient/family education;Manual techniques;Scar mobilization;Therapeutic exercise    PT Next Visit Plan  will begin Rt chest STM and stretching if return    PT Home Exercise Plan  Post op shoulder ROM HEP, single arm doorway and open book    Consulted and Agree with Plan of Care  Patient       Patient will benefit from skilled therapeutic intervention in order to improve the following deficits and impairments:  Postural dysfunction, Decreased knowledge of precautions, Impaired UE functional use, Decreased range of motion  Visit Diagnosis: 1. Abnormal posture   2. Other disturbances of skin  sensation        Problem List Patient Active Problem List   Diagnosis Date Noted  . Neoplasm of right breast, 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  . Biceps tendon rupture 05/10/2018  . Genetic testing 05/08/2018  . Family history of breast cancer   . Family history of ovarian cancer   . 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 Mass of left thigh 07/14/2017  . Hematoma of left thigh 03/18/2015  . Pain in joint, lower leg 04/05/2014  . Hand pain 03/29/2012  . Fibromyalgia 03/29/2012  . Well adult exam 03/17/2011  . HIP PAIN 02/06/2010  . HYPERGLYCEMIA 02/06/2010  . URI 08/08/2009  . ANEMIA-NOS 11/07/2007  . Essential hypertension 11/07/2007  . Osteoarthritis 11/07/2007    Stark Bray 10/02/2018, 12:16 PM  [place], [place], [address] Phone: [phone]   Fax:  [phone]  Name: [name] MRN: [id number] Date of Birth: 1950/07/21  PHYSICAL THERAPY DISCHARGE SUMMARY  Visits from Start of Care: 1  Current functional level related to goals / functional outcomes: Pt did not need to return after eval due  to doing well    Remaining deficits: none   Education / Equipment: HEP Plan: Patient agrees to discharge.  Patient goals were met. Patient is being discharged due to meeting the stated rehab goals.  ?????    Shan Levans, PT

## 2018-10-23 ENCOUNTER — Encounter: Payer: Self-pay | Admitting: Gastroenterology

## 2018-11-13 ENCOUNTER — Telehealth: Payer: Self-pay | Admitting: Oncology

## 2018-11-13 ENCOUNTER — Other Ambulatory Visit: Payer: Self-pay | Admitting: Oncology

## 2018-11-13 NOTE — Telephone Encounter (Signed)
Returned patient's phone call regarding cancelling October appointments, patient informed she isn't taking the prescribed medication the provider recommenced and felt there was no need for the appointment.   Message to provider.

## 2018-11-17 ENCOUNTER — Other Ambulatory Visit: Payer: Self-pay

## 2018-11-17 ENCOUNTER — Encounter: Payer: Self-pay | Admitting: Gastroenterology

## 2018-11-17 ENCOUNTER — Ambulatory Visit (AMBULATORY_SURGERY_CENTER): Payer: Self-pay | Admitting: *Deleted

## 2018-11-17 VITALS — Temp 96.6°F | Ht 64.5 in | Wt 132.0 lb

## 2018-11-17 DIAGNOSIS — Z86010 Personal history of colonic polyps: Secondary | ICD-10-CM

## 2018-11-17 MED ORDER — SUPREP BOWEL PREP KIT 17.5-3.13-1.6 GM/177ML PO SOLN: 1.0000 | Freq: Once | ORAL | 0 refills | Status: AC

## 2018-11-17 NOTE — Progress Notes (Signed)
No egg or soy allergy known to patient   issues with past sedation with any surgeries  or procedures- some hx of PONV , no intubation problems  No diet pills per patient No home 02 use per patient  No blood thinners per patient  Pt denies issues with constipation  No A fib or A flutter  EMMI video sent to pt's e mail   Due to the COVID-19 pandemic we are asking patients to follow these guidelines. Please only bring one care partner. Please be aware that your care partner may wait in the car in the parking lot or if they feel like they will be too hot to wait in the car, they may wait in the lobby on the 4th floor. All care partners are required to wear a mask the entire time (we do not have any that we can provide them), they need to practice social distancing, and we will do a Covid check for all patient's and care partners when you arrive. Also we will check their temperature and your temperature. If the care partner waits in their car they need to stay in the parking lot the entire time and we will call them on their cell phone when the patient is ready for discharge so they can bring the car to the front of the building. Also all patient's will need to wear a mask into building.

## 2018-11-24 ENCOUNTER — Encounter: Payer: Self-pay | Admitting: *Deleted

## 2018-11-30 ENCOUNTER — Telehealth: Payer: Self-pay

## 2018-11-30 NOTE — Telephone Encounter (Signed)
Covid-19 screening questions   Do you now or have you had a fever in the last 14 days? NO   Do you have any respiratory symptoms of shortness of breath or cough now or in the last 14 days? NO  Do you have any family members or close contacts with diagnosed or suspected Covid-19 in the past 14 days? NO  Have you been tested for Covid-19 and found to be positive? NO        

## 2018-12-01 ENCOUNTER — Encounter: Payer: Self-pay | Admitting: Gastroenterology

## 2018-12-01 ENCOUNTER — Other Ambulatory Visit: Payer: Self-pay

## 2018-12-01 ENCOUNTER — Ambulatory Visit (AMBULATORY_SURGERY_CENTER): Payer: Federal, State, Local not specified - PPO | Admitting: Gastroenterology

## 2018-12-01 VITALS — BP 161/87 | HR 80 | Temp 98.3°F | Resp 13 | Ht 64.5 in | Wt 132.0 lb

## 2018-12-01 DIAGNOSIS — Z86010 Personal history of colonic polyps: Secondary | ICD-10-CM

## 2018-12-01 MED ORDER — SODIUM CHLORIDE 0.9 % IV SOLN: 500.0000 mL | Freq: Once | INTRAVENOUS | Status: DC

## 2018-12-01 NOTE — Patient Instructions (Signed)
Handouts Provided:  Diverticulosis and High Fiber Diet  YOU HAD AN ENDOSCOPIC PROCEDURE TODAY AT [facility]:   Refer to the procedure report that was given to you for any specific questions about what was found during the examination.  If the procedure report does not answer your questions, please call your gastroenterologist to clarify.  If you requested that your care partner not be given the details of your procedure findings, then the procedure report has been included in a sealed envelope for you to review at your convenience later.  YOU SHOULD EXPECT: Some feelings of bloating in the abdomen. Passage of more gas than usual.  Walking can help get rid of the air that was put into your GI tract during the procedure and reduce the bloating. If you had a lower endoscopy (such as a colonoscopy or flexible sigmoidoscopy) you may notice spotting of blood in your stool or on the toilet paper. If you underwent a bowel prep for your procedure, you may not have a normal bowel movement for a few days.  Please Note:  You might notice some irritation and congestion in your nose or some drainage.  This is from the oxygen used during your procedure.  There is no need for concern and it should clear up in a day or so.  SYMPTOMS TO REPORT IMMEDIATELY:   Following lower endoscopy (colonoscopy or flexible sigmoidoscopy):  Excessive amounts of blood in the stool  Significant tenderness or worsening of abdominal pains  Swelling of the abdomen that is new, acute  Fever of 100F or higher  For urgent or emergent issues, a gastroenterologist can be reached at any hour by calling [phone].   DIET:  We do recommend a small meal at first, but then you may proceed to your regular diet.  Drink plenty of fluids but you should avoid alcoholic beverages for 24 hours.  ACTIVITY:  You should plan to take it easy for the rest of today and you should NOT DRIVE or use heavy machinery until tomorrow  (because of the sedation medicines used during the test).    FOLLOW UP: Our staff will call the number listed on your records 48-72 hours following your procedure to check on you and address any questions or concerns that you may have regarding the information given to you following your procedure. If we do not reach you, we will leave a message.  We will attempt to reach you two times.  During this call, we will ask if you have developed any symptoms of COVID 19. If you develop any symptoms (ie: fever, flu-like symptoms, shortness of breath, cough etc.) before then, please call [phone].  If you test positive for Covid 19 in the 2 weeks post procedure, please call and report this information to [place].    If any biopsies were taken you will be contacted by phone or by letter within the next 1-3 weeks.  Please call us at [phone] if you have not heard about the biopsies in 3 weeks.    SIGNATURES/CONFIDENTIALITY: You and/or your care partner have signed paperwork which will be entered into your electronic medical record.  These signatures attest to the fact that that the information above on your After Visit Summary has been reviewed and is understood.  Full responsibility of the confidentiality of this discharge information lies with you and/or your care-partner.

## 2018-12-01 NOTE — Progress Notes (Signed)
PT taken to PACU. Monitors in place. VSS. Report given to RN. 

## 2018-12-01 NOTE — Op Note (Signed)
[facility] Patient Name: [name] Procedure Date: 12/01/2018 11:02 AM MRN: [id number] Endoscopist: Ladene Artist , MD Age: 68 Referring MD:  Date of Birth: 01-Jul-1950 Gender: Female Account #: [id number] Procedure:                Colonoscopy Indications:              Surveillance: Personal history of adenomatous                            polyps on last colonoscopy 5 years ago Medicines:                Monitored Anesthesia Care Procedure:                Pre-Anesthesia Assessment:                           - Prior to the procedure, a History and Physical                            was performed, and patient medications and                            allergies were reviewed. The patient's tolerance of                            previous anesthesia was also reviewed. The risks                            and benefits of the procedure and the sedation                            options and risks were discussed with the patient.                            All questions were answered, and informed consent                            was obtained. Prior Anticoagulants: The patient has                            taken no previous anticoagulant or antiplatelet                            agents. ASA Grade Assessment: II - A patient with                            mild systemic disease. After reviewing the risks                            and benefits, the patient was deemed in                            satisfactory condition to undergo the procedure.  After obtaining informed consent, the colonoscope                            was passed under direct vision. Throughout the                            procedure, the patient's blood pressure, pulse, and                            oxygen saturations were monitored continuously. The                            Colonoscope was introduced through the anus and                            advanced to the the  cecum, identified by                            appendiceal orifice and ileocecal valve. The                            ileocecal valve, appendiceal orifice, and rectum                            were photographed. The quality of the bowel                            preparation was good. The colonoscopy was performed                            without difficulty. The patient tolerated the                            procedure well. Scope In: 11:13:00 AM Scope Out: 11:26:30 AM Scope Withdrawal Time: 0 hours 10 minutes 6 seconds  Total Procedure Duration: 0 hours 13 minutes 30 seconds  Findings:                 The perianal and digital rectal examinations were                            normal.                           Multiple medium-mouthed diverticula were found in                            the left colon. There was no evidence of                            diverticular bleeding.                           The exam was otherwise without abnormality on  direct and retroflexion views. Complications:            No immediate complications. Estimated blood loss:                            None. Estimated Blood Loss:     Estimated blood loss: none. Impression:               - Mild diverticulosis in the left colon.                           - The examination was otherwise normal on direct                            and retroflexion views.                           - No specimens collected. Recommendation:           - Repeat colonoscopy in 7 years for surveillance.                           - Patient has a contact number available for                            emergencies. The signs and symptoms of potential                            delayed complications were discussed with the                            patient. Return to normal activities tomorrow.                            Written discharge instructions were provided to the                             patient.                           - high fiber diet.                           - Continue present medications. Ladene Artist, MD 12/01/2018 11:31:31 AM This report has been signed electronically.

## 2018-12-01 NOTE — Progress Notes (Signed)
Pt's states no medical or surgical changes since previsit or office visit.  Temp KA VS [place]

## 2018-12-05 ENCOUNTER — Telehealth: Payer: Self-pay

## 2018-12-05 NOTE — Telephone Encounter (Signed)
  Follow up Call-  Call back number 12/01/2018  Post procedure Call Back phone  # [phone]  Permission to leave phone message Yes  Some recent data might be hidden     Patient questions:  Do you have a fever, pain , or abdominal swelling? No. Pain Score  0 *  Have you tolerated food without any problems? Yes.    Have you been able to return to your normal activities? Yes.    Do you have any questions about your discharge instructions: Diet   No. Medications  No. Follow up visit  No.  Do you have questions or concerns about your Care? No.  Actions: * If pain score is 4 or above: 1. No action needed, pain <4.Have you developed a fever since your procedure? no  2.   Have you had an respiratory symptoms (SOB or cough) since your procedure? no  3.   Have you tested positive for COVID 19 since your procedure no  4.   Have you had any family members/close contacts diagnosed with the COVID 19 since your procedure?  no   If yes to any of these questions please route to Joylene John, RN and Alphonsa Gin, Therapist, sports.

## 2018-12-27 ENCOUNTER — Ambulatory Visit: Payer: Federal, State, Local not specified - PPO | Admitting: Oncology

## 2019-02-17 ENCOUNTER — Other Ambulatory Visit: Payer: Self-pay | Admitting: Internal Medicine

## 2019-02-19 ENCOUNTER — Telehealth: Payer: Self-pay | Admitting: Adult Health

## 2019-02-19 NOTE — Telephone Encounter (Signed)
Called to reschedule and patient request to cancel

## 2019-03-01 ENCOUNTER — Encounter: Payer: Self-pay | Admitting: *Deleted

## 2019-03-12 ENCOUNTER — Encounter: Payer: Federal, State, Local not specified - PPO | Admitting: Adult Health

## 2019-07-24 ENCOUNTER — Other Ambulatory Visit: Payer: Self-pay

## 2019-07-24 ENCOUNTER — Ambulatory Visit (INDEPENDENT_AMBULATORY_CARE_PROVIDER_SITE_OTHER): Payer: Federal, State, Local not specified - PPO | Admitting: Internal Medicine

## 2019-07-24 ENCOUNTER — Other Ambulatory Visit: Payer: Self-pay | Admitting: Internal Medicine

## 2019-07-24 ENCOUNTER — Encounter: Payer: Self-pay | Admitting: Internal Medicine

## 2019-07-24 ENCOUNTER — Telehealth: Payer: Self-pay

## 2019-07-24 VITALS — BP 128/80 | HR 88 | Temp 98.6°F | Ht 64.5 in | Wt 128.0 lb

## 2019-07-24 DIAGNOSIS — E559 Vitamin D deficiency, unspecified: Secondary | ICD-10-CM

## 2019-07-24 DIAGNOSIS — D0511 Intraductal carcinoma in situ of right breast: Secondary | ICD-10-CM | POA: Diagnosis not present

## 2019-07-24 DIAGNOSIS — I10 Essential (primary) hypertension: Secondary | ICD-10-CM | POA: Diagnosis not present

## 2019-07-24 DIAGNOSIS — Z0000 Encounter for general adult medical examination without abnormal findings: Secondary | ICD-10-CM | POA: Diagnosis not present

## 2019-07-24 LAB — URINALYSIS
Bilirubin Urine: NEGATIVE
Hgb urine dipstick: NEGATIVE
Ketones, ur: NEGATIVE
Leukocytes,Ua: NEGATIVE
Nitrite: NEGATIVE
Specific Gravity, Urine: 1.02 (ref 1.000–1.030)
Total Protein, Urine: NEGATIVE
Urine Glucose: NEGATIVE
Urobilinogen, UA: 0.2 (ref 0.0–1.0)
pH: 7.5 (ref 5.0–8.0)

## 2019-07-24 LAB — HEPATIC FUNCTION PANEL
ALT: 11 U/L (ref 0–35)
AST: 14 U/L (ref 0–37)
Albumin: 4.4 g/dL (ref 3.5–5.2)
Alkaline Phosphatase: 101 U/L (ref 39–117)
Bilirubin, Direct: 0.1 mg/dL (ref 0.0–0.3)
Total Bilirubin: 0.4 mg/dL (ref 0.2–1.2)
Total Protein: 6.9 g/dL (ref 6.0–8.3)

## 2019-07-24 LAB — BASIC METABOLIC PANEL
BUN: 13 mg/dL (ref 6–23)
CO2: 29 mEq/L (ref 19–32)
Calcium: 9.2 mg/dL (ref 8.4–10.5)
Chloride: 107 mEq/L (ref 96–112)
Creatinine, Ser: 0.7 mg/dL (ref 0.40–1.20)
GFR: 100.61 mL/min (ref >60.00)
Glucose, Bld: 114 mg/dL — ABNORMAL HIGH (ref 70–99)
Potassium: 3.6 mEq/L (ref 3.5–5.1)
Sodium: 142 mEq/L (ref 135–145)

## 2019-07-24 LAB — CBC WITH DIFFERENTIAL/PLATELET
Basophils Absolute: 0.1 10*3/uL (ref 0.0–0.1)
Basophils Relative: 1.3 % (ref 0.0–3.0)
Eosinophils Absolute: 0.2 10*3/uL (ref 0.0–0.7)
Eosinophils Relative: 4.1 % (ref 0.0–5.0)
HCT: 38 % (ref 36.0–46.0)
Hemoglobin: 12.4 g/dL (ref 12.0–15.0)
Lymphocytes Relative: 34.2 % (ref 12.0–46.0)
Lymphs Abs: 1.5 10*3/uL (ref 0.7–4.0)
MCHC: 32.7 g/dL (ref 30.0–36.0)
MCV: 85.6 fl (ref 78.0–100.0)
Monocytes Absolute: 0.4 10*3/uL (ref 0.1–1.0)
Monocytes Relative: 9.4 % (ref 3.0–12.0)
Neutro Abs: 2.3 10*3/uL (ref 1.4–7.7)
Neutrophils Relative %: 51 % (ref 43.0–77.0)
Platelets: 269 10*3/uL (ref 150.0–400.0)
RBC: 4.44 Mil/uL (ref 3.87–5.11)
RDW: 14.4 % (ref 11.5–15.5)
WBC: 4.5 10*3/uL (ref 4.0–10.5)

## 2019-07-24 LAB — LIPID PANEL
Cholesterol: 209 mg/dL — ABNORMAL HIGH (ref 0–200)
HDL: 76.7 mg/dL (ref >39.00)
LDL Cholesterol: 118 mg/dL — ABNORMAL HIGH (ref 0–99)
NonHDL: 132.06
Total CHOL/HDL Ratio: 3
Triglycerides: 70 mg/dL (ref 0.0–149.0)
VLDL: 14 mg/dL (ref 0.0–40.0)

## 2019-07-24 LAB — TSH: TSH: 1.46 u[IU]/mL (ref 0.35–4.50)

## 2019-07-24 LAB — VITAMIN D 25 HYDROXY (VIT D DEFICIENCY, FRACTURES): VITD: >120 ng/mL

## 2019-07-24 MED ORDER — MELOXICAM 15 MG PO TABS: 15.0000 mg | ORAL_TABLET | Freq: Every day | ORAL | 1 refills | Status: DC

## 2019-07-24 MED ORDER — AMLODIPINE BESYLATE 2.5 MG PO TABS: 2.5000 mg | ORAL_TABLET | Freq: Every day | ORAL | 3 refills | Status: DC

## 2019-07-24 NOTE — Telephone Encounter (Signed)
CRITICAL VALUE STICKER  CRITICAL VALUE: Vitamin D  >120  RECEIVER (on-site recipient of call): Elza Rafter [facility]  DATE & TIME NOTIFIED: 07/24/19 1103  MESSENGER (representative from lab): [place] (from [facility] lab)  MD NOTIFIED: [place]: 07/24/19 1134  RESPONSE: MD aware

## 2019-07-24 NOTE — Addendum Note (Signed)
Addended by: Adair Patter on: 07/24/2019 09:09 AM   Modules accepted: Orders

## 2019-07-24 NOTE — Progress Notes (Signed)
Subjective:  Patient ID: [name], female    DOB: [birth year]  Age: 69 y.o. MRN: [id number]  CC: No chief complaint on file.   HPI [name] presents for a well exam F/u HTN  Outpatient Medications Prior to Visit  Medication Sig Dispense Refill  . Cholecalciferol (VITAMIN D3) 125 MCG (5000 UT) CAPS Take 5,000 Units by mouth daily.    Javier Docker Oil 1000 MG CAPS Take 1,000 mg by mouth daily.    . Multiple Vitamin (MULTIVITAMIN) tablet Take 1 tablet by mouth daily.    Jonna Coup Leaf Extract 250 MG CAPS Take 700 mg by mouth daily.    Marland Kitchen OVER THE COUNTER MEDICATION Take 1 tablet by mouth daily. tumeric    . amLODipine (NORVASC) 2.5 MG tablet Take 1 tablet (2.5 mg total) by mouth daily. 90 tablet 3  . meloxicam (MOBIC) 15 MG tablet TAKE 1 TABLET BY MOUTH EVERY DAY 90 tablet 1   No facility-administered medications prior to visit.    ROS: Review of Systems  Constitutional: Negative for activity change, appetite change, chills, fatigue and unexpected weight change.  HENT: Negative for congestion, mouth sores and sinus pressure.   Eyes: Negative for visual disturbance.  Respiratory: Negative for cough and chest tightness.   Gastrointestinal: Negative for abdominal pain and nausea.  Genitourinary: Negative for difficulty urinating, frequency and vaginal pain.  Musculoskeletal: Negative for back pain and gait problem.  Skin: Negative for pallor and rash.  Neurological: Negative for dizziness, tremors, weakness, numbness and headaches.  Psychiatric/Behavioral: Negative for confusion, sleep disturbance and suicidal ideas.    Objective:  BP 128/80 (BP Location: Left Arm, Patient Position: Sitting, Cuff Size: Normal)   Pulse 88   Temp 98.6 F (37 C) (Oral)   Ht 5' 4.5" (1.638 m)   Wt 128 lb (58.1 kg)   SpO2 97%   BMI 21.63 kg/m   BP Readings from Last 3 Encounters:  07/24/19 128/80  12/01/18 (!) 161/87  09/25/18 (!) 146/85    Wt Readings from Last 3 Encounters:    07/24/19 128 lb (58.1 kg)  12/01/18 132 lb (59.9 kg)  11/17/18 132 lb (59.9 kg)    Physical Exam Constitutional:      General: She is not in acute distress.    Appearance: She is well-developed.  HENT:     Head: Normocephalic.     Right Ear: External ear normal.     Left Ear: External ear normal.     Nose: Nose normal.  Eyes:     General:        Right eye: No discharge.        Left eye: No discharge.     Conjunctiva/sclera: Conjunctivae normal.     Pupils: Pupils are equal, round, and reactive to light.  Neck:     Thyroid: No thyromegaly.     Vascular: No JVD.     Trachea: No tracheal deviation.  Cardiovascular:     Rate and Rhythm: Normal rate and regular rhythm.     Heart sounds: Normal heart sounds.  Pulmonary:     Effort: No respiratory distress.     Breath sounds: No stridor. No wheezing.  Abdominal:     General: Bowel sounds are normal. There is no distension.     Palpations: Abdomen is soft. There is no mass.     Tenderness: There is no abdominal tenderness. There is no guarding or rebound.  Musculoskeletal:        General: No  tenderness.     Cervical back: Normal range of motion and neck supple.  Lymphadenopathy:     Cervical: No cervical adenopathy.  Skin:    Findings: No erythema or rash.  Neurological:     Cranial Nerves: No cranial nerve deficit.     Motor: No abnormal muscle tone.     Coordination: Coordination normal.     Deep Tendon Reflexes: Reflexes normal.  Psychiatric:        Behavior: Behavior normal.        Thought Content: Thought content normal.        Judgment: Judgment normal.     Lab Results  Component Value Date   WBC 5.5 07/21/2018   HGB 12.5 07/21/2018   HCT 37.6 07/21/2018   PLT 285.0 07/21/2018   GLUCOSE 89 07/21/2018   CHOL 230 (H) 07/21/2018   TRIG 97.0 07/21/2018   HDL 79.00 07/21/2018   LDLDIRECT 90.1 03/22/2012   LDLCALC 132 (H) 07/21/2018   ALT 11 07/21/2018   AST 14 07/21/2018   NA 141 07/21/2018   K 3.5  07/21/2018   CL 103 07/21/2018   CREATININE 0.66 07/21/2018   BUN 11 07/21/2018   CO2 29 07/21/2018   TSH 2.13 07/21/2018    NM Sentinel Node Inj-No Rpt (Breast)  Result Date: 05/23/2018 Sulfur colloid was injected by the nuclear medicine technologist for melanoma sentinel node.    Assessment & Plan:   There are no diagnoses linked to this encounter.   Meds ordered this encounter  Medications  . meloxicam (MOBIC) 15 MG tablet    Sig: Take 1 tablet (15 mg total) by mouth daily.    Dispense:  90 tablet    Refill:  1  . amLODipine (NORVASC) 2.5 MG tablet    Sig: Take 1 tablet (2.5 mg total) by mouth daily.    Dispense:  90 tablet    Refill:  3     Follow-up: No follow-ups on file.  Walker Kehr, MD

## 2019-07-24 NOTE — Assessment & Plan Note (Signed)
a high Vit D level. Please stop your Vit D 5000 iu and re-check your labs in 6-8 weeks as we discussed.

## 2019-07-24 NOTE — Assessment & Plan Note (Signed)
Amlodipine.

## 2019-07-24 NOTE — Assessment & Plan Note (Signed)
Dr Donella Stade every year

## 2019-07-24 NOTE — Assessment & Plan Note (Addendum)
  We discussed age appropriate health related issues, including available/recomended screening tests and vaccinations. Labs were ordered to be later reviewed . All questions were answered. We discussed one or more of the following - seat belt use, use of sunscreen/sun exposure exercise, safe sex, fall risk reduction, second hand smoke exposure, firearm use and storage, seat belt use, a need for adhering to healthy diet and exercise. Labs were ordered . All questions were answered. Last colon 2020, due 2027 A cardiac CT scan for calcium scoring offered

## 2019-07-24 NOTE — Patient Instructions (Signed)

## 2020-02-13 IMAGING — MG MM BREAST BX W/ LOC DEV EA AD LESION IMG BX SPEC STEREO GUIDE*R*
8 of 12 series · 8 of 20 positions shown · non-contrast
Comparison: Previous exams.

Addendum:
CLINICAL DATA: Patient has recent diagnosis ductal carcinoma in
situ at 2 sites with ultrasound-guided core biopsies. Biopsies of
anterior calcifications of right breast were recommended to assess
the extent of disease.

EXAM:
RIGHT BREAST STEREOTACTIC CORE NEEDLE BIOPSY

[R (1 of 5)]
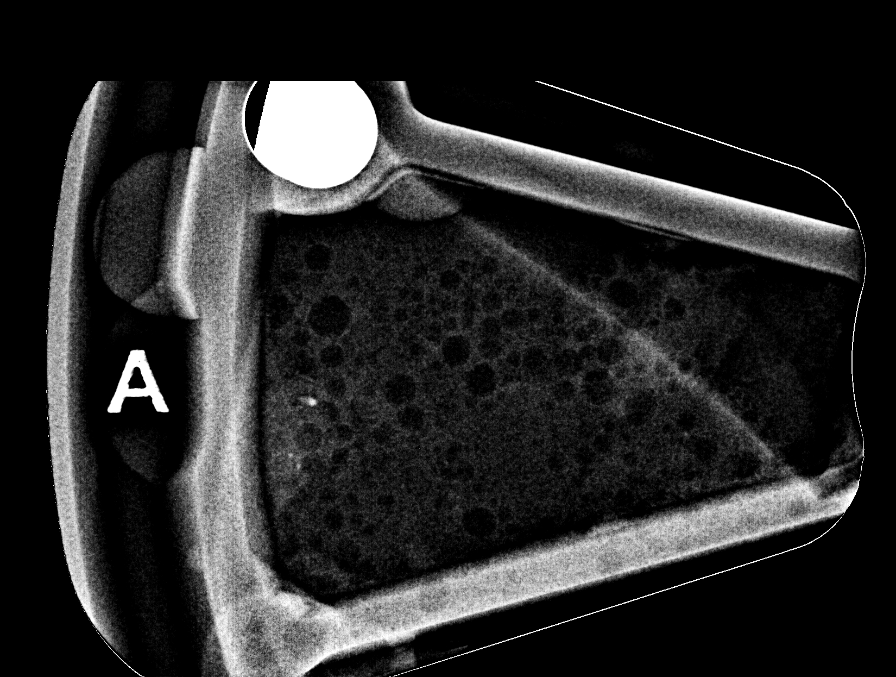

[R (2 of 5)]
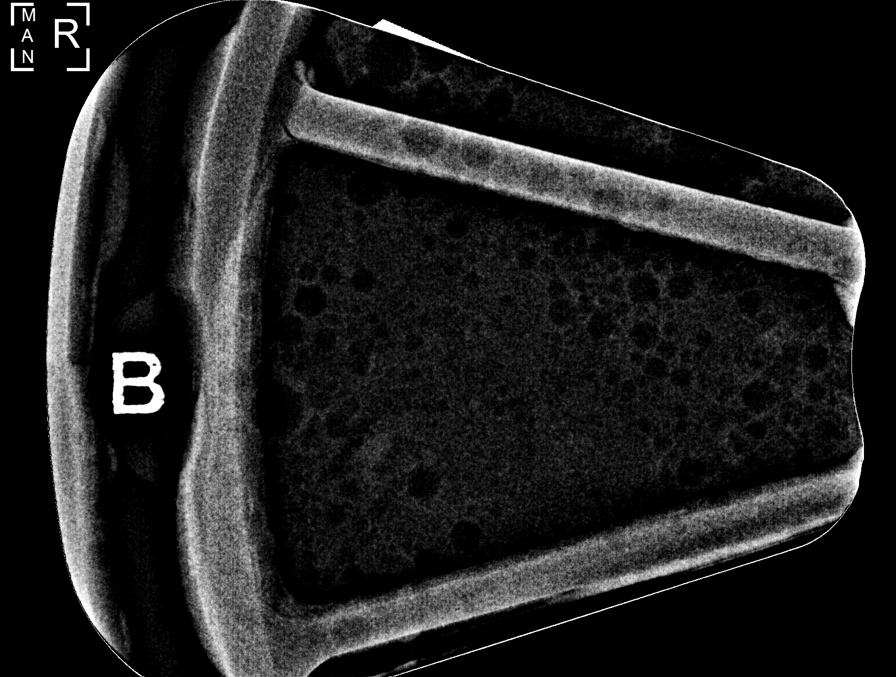

[R (3 of 5)]
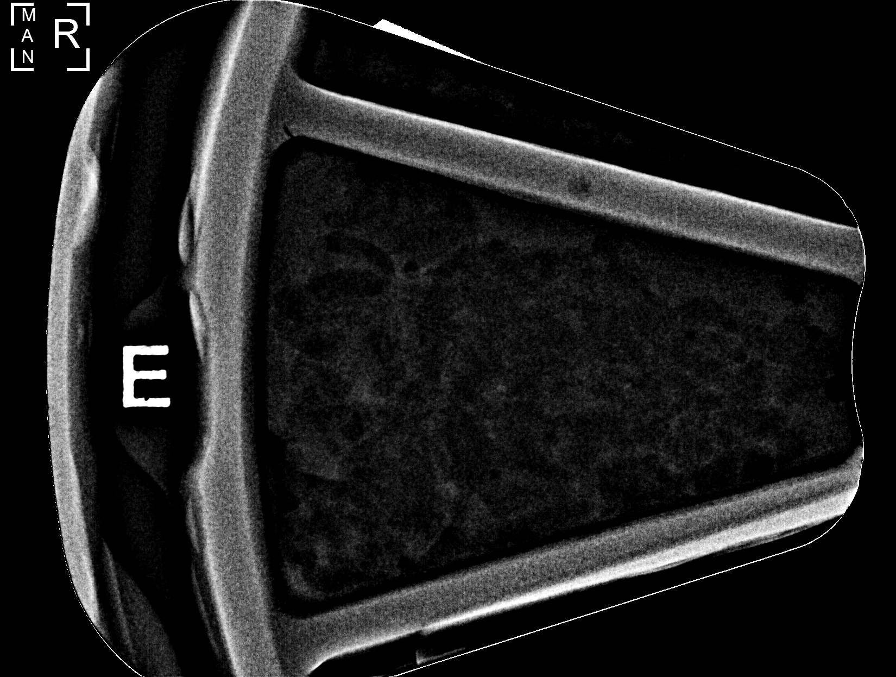

[R (4 of 5)]
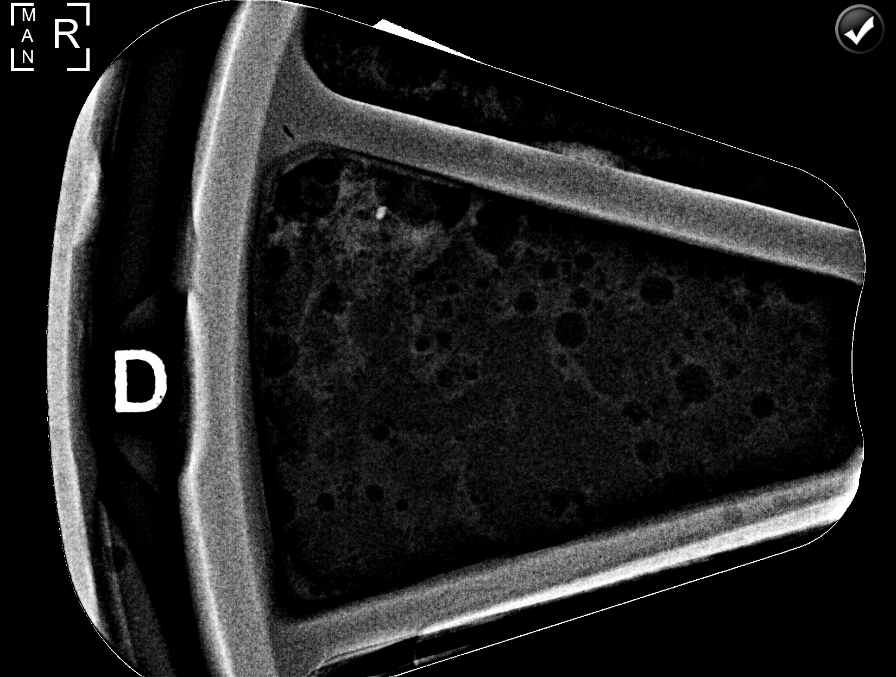

[R (5 of 5)]
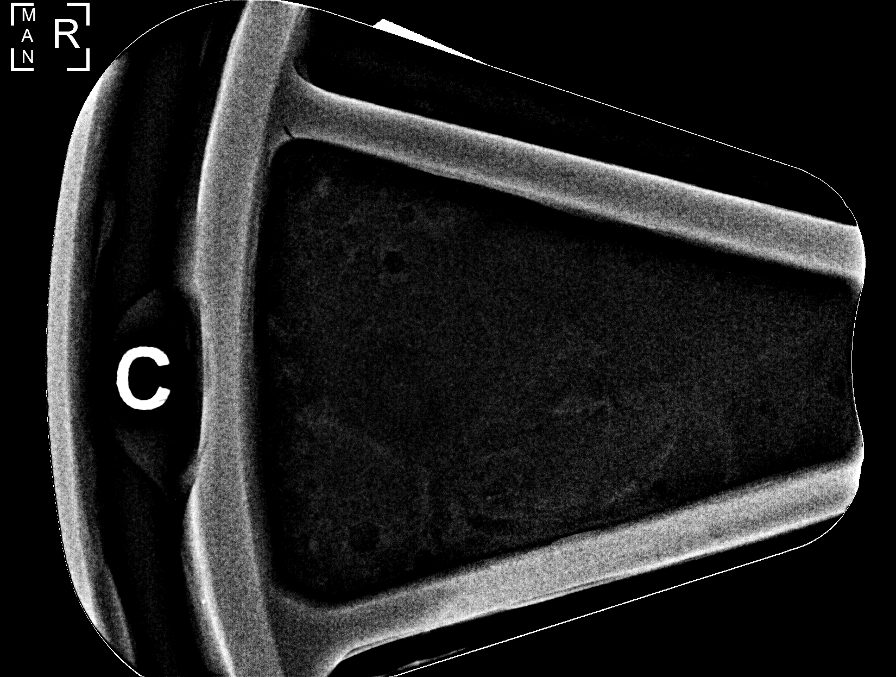

[R CC (1 of 3)]
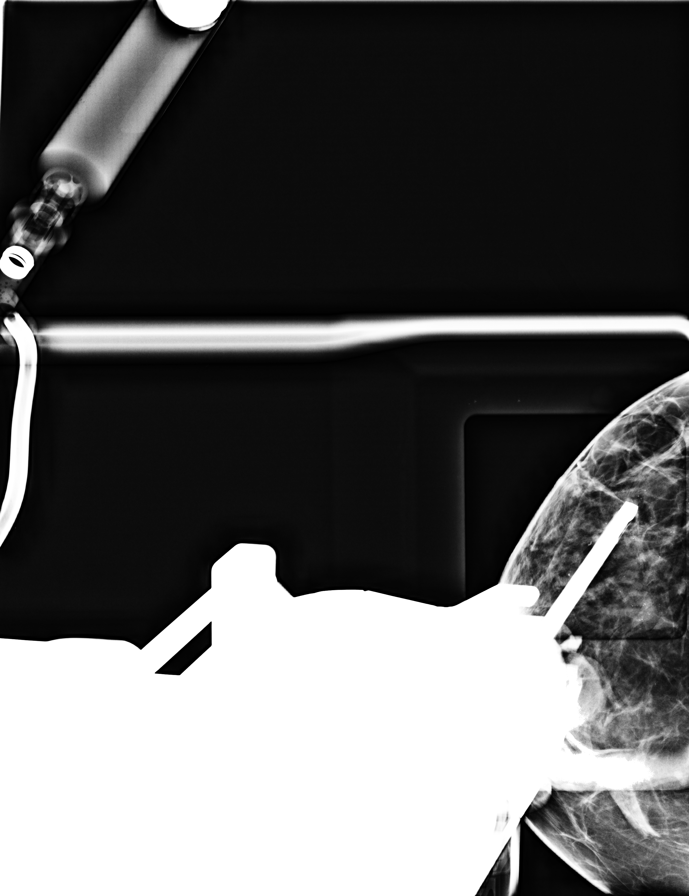

[R CC (2 of 3)]
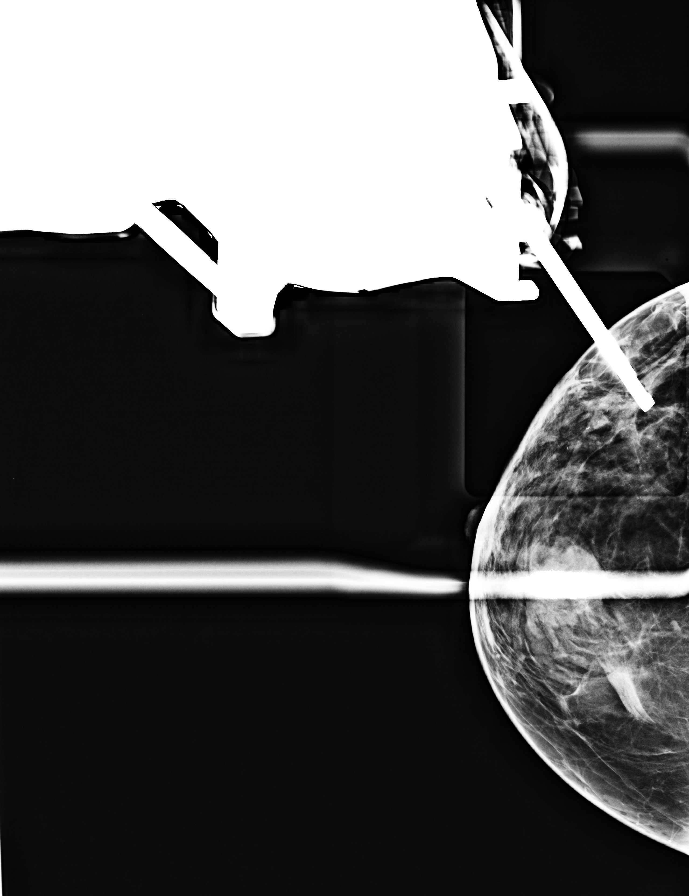

[R CC (3 of 3)]
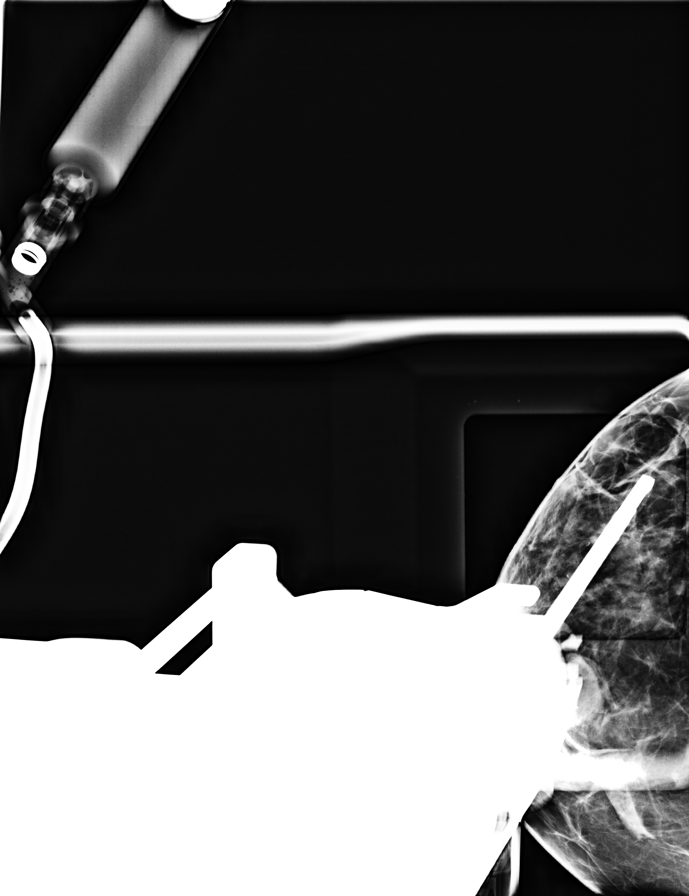

[8 of 20 positions shown; findings below may reference images not displayed]



Using sterile technique and 1% Lidocaine as local anesthetic, under
stereotactic guidance, a 9 gauge vacuum assisted device was used to
perform core needle biopsy of calcifications in the retroareolar
right breast using a cranial approach. Specimen radiograph was
performed showing inclusion a calcifications of concern. Specimens
with calcifications are identified for pathology.

Lesion quadrant: Retroareolar

At the conclusion of the procedure, a ribbon tissue marker clip (the
patient already has a ribbon clip more posteriorly located from the
recent ultrasound-guided core biopsy but we only stock 3 biopsy clip
shapes, therefore, we use another ribbon clip.) Was deployed into
the biopsy cavity. Follow-up 2-view mammogram was performed and
dictated separately.

Using sterile technique and 1% Lidocaine as local anesthetic, under
stereotactic guidance, a 9 gauge vacuum assisted device was used to
perform core needle biopsy of calcifications in the upper-outer
quadrant right breast using a cranial approach. Specimen radiograph
was performed showing . Specimens with calcifications are identified
for pathology.

Lesion quadrant: Upper-outer quadrant

At the conclusion of the procedure, a X tissue marker clip was
deployed into the biopsy cavity. Follow-up 2-view mammogram was
performed and dictated separately.
IMPRESSION: Stereotactic-guided biopsiesof calcifications in the right breast..
No apparent complications.

ADDENDUM:
Pathology revealed HIGH GRADE DUCTAL CARCINOMA IN SITU WITH
CALCIFICATIONS AND NECROSIS of the RIGHT breast, both locations,
retroareolar and upper outer quadrant. This was found to be
concordant by Dr. Sondra Stines.

Pathology results were discussed with the patient by telephone. The
patient reported doing well after the biopsy with tenderness at the
site. Post biopsy instructions and care were reviewed and questions
were answered. The patient was encouraged to call The [REDACTED]

The patient has a recent diagnosis of right breast cancer and should
follow her outlined treatment plan.

The patient was referred to [REDACTED]
[REDACTED] at [REDACTED] on
April 26, 2018

Pathology results reported by Mely Harman, RN on 04/21/2018.

*** End of Addendum ***

## 2020-06-20 ENCOUNTER — Emergency Department (HOSPITAL_COMMUNITY): Payer: Medicare Other | Admitting: Certified Registered"

## 2020-06-20 ENCOUNTER — Inpatient Hospital Stay (HOSPITAL_COMMUNITY)
Admission: EM | Admit: 2020-06-20 | Discharge: 2020-06-25 | DRG: 329 | Disposition: A | Discharge status: Home Health | Payer: Medicare Other | Attending: General Surgery | Admitting: General Surgery

## 2020-06-20 ENCOUNTER — Other Ambulatory Visit: Payer: Self-pay

## 2020-06-20 ENCOUNTER — Encounter (HOSPITAL_COMMUNITY): Admission: EM | Disposition: A | Discharge status: Home Health | Payer: Self-pay | Source: Home / Self Care

## 2020-06-20 ENCOUNTER — Telehealth: Payer: Self-pay | Admitting: Internal Medicine

## 2020-06-20 ENCOUNTER — Emergency Department (HOSPITAL_COMMUNITY): Payer: Medicare Other

## 2020-06-20 ENCOUNTER — Encounter (HOSPITAL_COMMUNITY): Payer: Self-pay

## 2020-06-20 DIAGNOSIS — Z888 Allergy status to other drugs, medicaments and biological substances status: Secondary | ICD-10-CM | POA: Diagnosis not present

## 2020-06-20 DIAGNOSIS — Z87891 Personal history of nicotine dependence: Secondary | ICD-10-CM | POA: Diagnosis not present

## 2020-06-20 DIAGNOSIS — Z885 Allergy status to narcotic agent status: Secondary | ICD-10-CM

## 2020-06-20 DIAGNOSIS — R1032 Left lower quadrant pain: Secondary | ICD-10-CM | POA: Diagnosis not present

## 2020-06-20 DIAGNOSIS — Z79899 Other long term (current) drug therapy: Secondary | ICD-10-CM | POA: Diagnosis not present

## 2020-06-20 DIAGNOSIS — Z791 Long term (current) use of non-steroidal anti-inflammatories (NSAID): Secondary | ICD-10-CM | POA: Diagnosis not present

## 2020-06-20 DIAGNOSIS — K5720 Diverticulitis of large intestine with perforation and abscess without bleeding: Secondary | ICD-10-CM | POA: Diagnosis present

## 2020-06-20 DIAGNOSIS — E876 Hypokalemia: Secondary | ICD-10-CM | POA: Diagnosis not present

## 2020-06-20 DIAGNOSIS — K5792 Diverticulitis of intestine, part unspecified, without perforation or abscess without bleeding: Secondary | ICD-10-CM

## 2020-06-20 DIAGNOSIS — Z853 Personal history of malignant neoplasm of breast: Secondary | ICD-10-CM

## 2020-06-20 DIAGNOSIS — Z9011 Acquired absence of right breast and nipple: Secondary | ICD-10-CM

## 2020-06-20 DIAGNOSIS — Z803 Family history of malignant neoplasm of breast: Secondary | ICD-10-CM

## 2020-06-20 DIAGNOSIS — Z90710 Acquired absence of both cervix and uterus: Secondary | ICD-10-CM | POA: Diagnosis not present

## 2020-06-20 DIAGNOSIS — Z8249 Family history of ischemic heart disease and other diseases of the circulatory system: Secondary | ICD-10-CM

## 2020-06-20 DIAGNOSIS — M8580 Other specified disorders of bone density and structure, unspecified site: Secondary | ICD-10-CM | POA: Diagnosis present

## 2020-06-20 DIAGNOSIS — Z933 Colostomy status: Secondary | ICD-10-CM

## 2020-06-20 DIAGNOSIS — K658 Other peritonitis: Secondary | ICD-10-CM | POA: Diagnosis present

## 2020-06-20 DIAGNOSIS — Z20822 Contact with and (suspected) exposure to covid-19: Secondary | ICD-10-CM | POA: Diagnosis present

## 2020-06-20 DIAGNOSIS — I10 Essential (primary) hypertension: Secondary | ICD-10-CM | POA: Diagnosis present

## 2020-06-20 DIAGNOSIS — M1990 Unspecified osteoarthritis, unspecified site: Secondary | ICD-10-CM | POA: Diagnosis present

## 2020-06-20 DIAGNOSIS — Z9889 Other specified postprocedural states: Secondary | ICD-10-CM | POA: Diagnosis not present

## 2020-06-20 DIAGNOSIS — R198 Other specified symptoms and signs involving the digestive system and abdomen: Secondary | ICD-10-CM

## 2020-06-20 HISTORY — PX: LAPAROTOMY: SHX154

## 2020-06-20 LAB — URINALYSIS, ROUTINE W REFLEX MICROSCOPIC
Bilirubin Urine: NEGATIVE
Glucose, UA: NEGATIVE mg/dL
Ketones, ur: 80 mg/dL — AB
Nitrite: NEGATIVE
Protein, ur: 100 mg/dL — AB
Specific Gravity, Urine: 1.02 (ref 1.005–1.030)
WBC, UA: >50 WBC/hpf — ABNORMAL HIGH (ref 0–5)
pH: 5 (ref 5.0–8.0)

## 2020-06-20 LAB — COMPREHENSIVE METABOLIC PANEL
ALT: 14 U/L (ref 0–44)
AST: 17 U/L (ref 15–41)
Albumin: 3.3 g/dL — ABNORMAL LOW (ref 3.5–5.0)
Alkaline Phosphatase: 102 U/L (ref 38–126)
Anion gap: 11 (ref 5–15)
BUN: 14 mg/dL (ref 8–23)
CO2: 24 mmol/L (ref 22–32)
Calcium: 9.2 mg/dL (ref 8.9–10.3)
Chloride: 101 mmol/L (ref 98–111)
Creatinine, Ser: 1.01 mg/dL — ABNORMAL HIGH (ref 0.44–1.00)
GFR, Estimated: >60 mL/min (ref >60)
Glucose, Bld: 106 mg/dL — ABNORMAL HIGH (ref 70–99)
Potassium: 3.5 mmol/L (ref 3.5–5.1)
Sodium: 136 mmol/L (ref 135–145)
Total Bilirubin: 1.3 mg/dL — ABNORMAL HIGH (ref 0.3–1.2)
Total Protein: 6.9 g/dL (ref 6.5–8.1)

## 2020-06-20 LAB — CBC
HCT: 39.1 % (ref 36.0–46.0)
Hemoglobin: 12.3 g/dL (ref 12.0–15.0)
MCH: 27.6 pg (ref 26.0–34.0)
MCHC: 31.5 g/dL (ref 30.0–36.0)
MCV: 87.9 fL (ref 80.0–100.0)
Platelets: 274 10*3/uL (ref 150–400)
RBC: 4.45 MIL/uL (ref 3.87–5.11)
RDW: 14.1 % (ref 11.5–15.5)
WBC: 14.2 10*3/uL — ABNORMAL HIGH (ref 4.0–10.5)
nRBC: 0 % (ref 0.0–0.2)

## 2020-06-20 LAB — LIPASE, BLOOD: Lipase: 21 U/L (ref 11–51)

## 2020-06-20 LAB — ABO/RH: ABO/RH(D): O POS

## 2020-06-20 LAB — POC OCCULT BLOOD, ED: Fecal Occult Bld: POSITIVE — AB

## 2020-06-20 LAB — TYPE AND SCREEN
ABO/RH(D): O POS
Antibody Screen: NEGATIVE

## 2020-06-20 LAB — RESP PANEL BY RT-PCR (FLU A&B, COVID) ARPGX2
Influenza A by PCR: NEGATIVE
Influenza B by PCR: NEGATIVE
SARS Coronavirus 2 by RT PCR: NEGATIVE

## 2020-06-20 LAB — GLUCOSE, CAPILLARY: Glucose-Capillary: 74 mg/dL (ref 70–99)

## 2020-06-20 LAB — LACTIC ACID, PLASMA: Lactic Acid, Venous: 1 mmol/L (ref 0.5–1.9)

## 2020-06-20 SURGERY — LAPAROTOMY, EXPLORATORY
Anesthesia: General | Site: Abdomen

## 2020-06-20 MED ORDER — PROMETHAZINE HCL 25 MG/ML IJ SOLN: 6.2500 mg | INTRAMUSCULAR | Status: DC | PRN

## 2020-06-20 MED ORDER — DEXAMETHASONE SODIUM PHOSPHATE 10 MG/ML IJ SOLN
INTRAMUSCULAR | Status: DC | PRN
  Administered 2020-06-20: 10 mg via INTRAVENOUS

## 2020-06-20 MED ORDER — ACETAMINOPHEN 500 MG PO TABS
1000.0000 mg | ORAL_TABLET | Freq: Four times a day (QID) | ORAL | Status: DC
  Filled 2020-06-20: qty 2

## 2020-06-20 MED ORDER — PIPERACILLIN-TAZOBACTAM 3.375 G IVPB
3.3750 g | Freq: Three times a day (TID) | INTRAVENOUS | Status: DC
  Administered 2020-06-20: 3.375 g via INTRAVENOUS
  Filled 2020-06-20: qty 50

## 2020-06-20 MED ORDER — FENTANYL CITRATE (PF) 100 MCG/2ML IJ SOLN
INTRAMUSCULAR | Status: AC
  Filled 2020-06-20: qty 2

## 2020-06-20 MED ORDER — CEFTRIAXONE SODIUM 1 G IJ SOLR
1.0000 g | INTRAMUSCULAR | Status: DC
  Filled 2020-06-20: qty 10

## 2020-06-20 MED ORDER — OXYCODONE HCL 5 MG PO TABS: 5.0000 mg | ORAL_TABLET | Freq: Four times a day (QID) | ORAL | Status: DC | PRN

## 2020-06-20 MED ORDER — CHLORHEXIDINE GLUCONATE 0.12 % MT SOLN
OROMUCOSAL | Status: AC
  Administered 2020-06-20: 15 mL via OROMUCOSAL
  Filled 2020-06-20: qty 15

## 2020-06-20 MED ORDER — METRONIDAZOLE IN NACL 5-0.79 MG/ML-% IV SOLN
INTRAVENOUS | Status: DC | PRN
  Administered 2020-06-20: 500 mg via INTRAVENOUS

## 2020-06-20 MED ORDER — ROCURONIUM 10MG/ML (10ML) SYRINGE FOR MEDFUSION PUMP - OPTIME
INTRAVENOUS | Status: DC | PRN
  Administered 2020-06-20: 50 mg via INTRAVENOUS

## 2020-06-20 MED ORDER — ROCURONIUM BROMIDE 10 MG/ML (PF) SYRINGE
PREFILLED_SYRINGE | INTRAVENOUS | Status: AC
  Filled 2020-06-20: qty 20

## 2020-06-20 MED ORDER — AMISULPRIDE (ANTIEMETIC) 5 MG/2ML IV SOLN: 10.0000 mg | Freq: Once | INTRAVENOUS | Status: DC | PRN

## 2020-06-20 MED ORDER — PROPOFOL 10 MG/ML IV BOLUS
INTRAVENOUS | Status: DC | PRN
  Administered 2020-06-20: 125 mg via INTRAVENOUS

## 2020-06-20 MED ORDER — METRONIDAZOLE IN NACL 5-0.79 MG/ML-% IV SOLN
500.0000 mg | Freq: Once | INTRAVENOUS | Status: DC
  Filled 2020-06-20 (×2): qty 100

## 2020-06-20 MED ORDER — PHENYLEPHRINE HCL (PRESSORS) 10 MG/ML IV SOLN
INTRAVENOUS | Status: DC | PRN
  Administered 2020-06-20: 80 ug via INTRAVENOUS
  Administered 2020-06-20: 120 ug via INTRAVENOUS

## 2020-06-20 MED ORDER — ONDANSETRON HCL 4 MG/2ML IJ SOLN
INTRAMUSCULAR | Status: DC | PRN
  Administered 2020-06-20: 4 mg via INTRAVENOUS

## 2020-06-20 MED ORDER — PROPOFOL 10 MG/ML IV BOLUS
INTRAVENOUS | Status: AC
  Filled 2020-06-20: qty 20

## 2020-06-20 MED ORDER — HYDROMORPHONE HCL 1 MG/ML IJ SOLN
0.5000 mg | INTRAMUSCULAR | Status: DC | PRN
  Administered 2020-06-21: 0.5 mg via INTRAVENOUS
  Filled 2020-06-20: qty 1

## 2020-06-20 MED ORDER — LACTATED RINGERS IV SOLN: INTRAVENOUS | Status: DC | PRN

## 2020-06-20 MED ORDER — PHENYLEPHRINE 40 MCG/ML (10ML) SYRINGE FOR IV PUSH (FOR BLOOD PRESSURE SUPPORT)
PREFILLED_SYRINGE | INTRAVENOUS | Status: AC
  Filled 2020-06-20: qty 20

## 2020-06-20 MED ORDER — SUCCINYLCHOLINE CHLORIDE 200 MG/10ML IV SOSY
PREFILLED_SYRINGE | INTRAVENOUS | Status: AC
  Filled 2020-06-20: qty 10

## 2020-06-20 MED ORDER — SUCCINYLCHOLINE 20MG/ML (10ML) SYRINGE FOR MEDFUSION PUMP - OPTIME
INTRAMUSCULAR | Status: DC | PRN
  Administered 2020-06-20: 100 mg via INTRAVENOUS

## 2020-06-20 MED ORDER — LIDOCAINE 2% (20 MG/ML) 5 ML SYRINGE
INTRAMUSCULAR | Status: AC
  Filled 2020-06-20: qty 5

## 2020-06-20 MED ORDER — AMLODIPINE BESYLATE 2.5 MG PO TABS
2.5000 mg | ORAL_TABLET | Freq: Every day | ORAL | Status: DC
  Administered 2020-06-23 – 2020-06-25 (×3): 2.5 mg via ORAL
  Filled 2020-06-20 (×4): qty 1

## 2020-06-20 MED ORDER — LACTATED RINGERS IV BOLUS
500.0000 mL | Freq: Once | INTRAVENOUS | Status: AC
  Administered 2020-06-20: 500 mL via INTRAVENOUS

## 2020-06-20 MED ORDER — 0.9 % SODIUM CHLORIDE (POUR BTL) OPTIME
TOPICAL | Status: DC | PRN
  Administered 2020-06-20: 2000 mL

## 2020-06-20 MED ORDER — IOHEXOL 350 MG/ML SOLN
100.0000 mL | Freq: Once | INTRAVENOUS | Status: AC | PRN
  Administered 2020-06-20: 100 mL via INTRAVENOUS

## 2020-06-20 MED ORDER — CHLORHEXIDINE GLUCONATE CLOTH 2 % EX PADS
6.0000 | MEDICATED_PAD | Freq: Every day | CUTANEOUS | Status: DC
  Administered 2020-06-21 – 2020-06-25 (×4): 6 via TOPICAL

## 2020-06-20 MED ORDER — DIPHENHYDRAMINE HCL 50 MG/ML IJ SOLN: 12.5000 mg | Freq: Four times a day (QID) | INTRAMUSCULAR | Status: DC | PRN

## 2020-06-20 MED ORDER — LACTATED RINGERS IV SOLN: INTRAVENOUS | Status: DC

## 2020-06-20 MED ORDER — ONDANSETRON HCL 4 MG/2ML IJ SOLN
INTRAMUSCULAR | Status: AC
  Filled 2020-06-20: qty 2

## 2020-06-20 MED ORDER — SIMETHICONE 80 MG PO CHEW: 40.0000 mg | CHEWABLE_TABLET | Freq: Four times a day (QID) | ORAL | Status: DC | PRN

## 2020-06-20 MED ORDER — CHLORHEXIDINE GLUCONATE 0.12 % MT SOLN: 15.0000 mL | Freq: Once | OROMUCOSAL | Status: AC

## 2020-06-20 MED ORDER — DEXTROSE 5 % IV SOLN
INTRAVENOUS | Status: DC | PRN
  Administered 2020-06-20: 1 g via INTRAVENOUS

## 2020-06-20 MED ORDER — SODIUM CHLORIDE 0.9 % IV BOLUS
1000.0000 mL | Freq: Once | INTRAVENOUS | Status: AC
  Administered 2020-06-20: 1000 mL via INTRAVENOUS

## 2020-06-20 MED ORDER — DIPHENHYDRAMINE HCL 12.5 MG/5ML PO ELIX: 12.5000 mg | ORAL_SOLUTION | Freq: Four times a day (QID) | ORAL | Status: DC | PRN

## 2020-06-20 MED ORDER — ONDANSETRON 4 MG PO TBDP: 4.0000 mg | ORAL_TABLET | Freq: Four times a day (QID) | ORAL | Status: DC | PRN

## 2020-06-20 MED ORDER — CEFTRIAXONE SODIUM 1 G IJ SOLR
1.0000 g | Freq: Once | INTRAMUSCULAR | Status: DC
  Filled 2020-06-20 (×2): qty 10

## 2020-06-20 MED ORDER — DEXAMETHASONE SODIUM PHOSPHATE 10 MG/ML IJ SOLN
INTRAMUSCULAR | Status: AC
  Filled 2020-06-20: qty 1

## 2020-06-20 MED ORDER — SODIUM CHLORIDE 0.9 % IV SOLN: INTRAVENOUS | Status: DC

## 2020-06-20 MED ORDER — FENTANYL CITRATE (PF) 250 MCG/5ML IJ SOLN
INTRAMUSCULAR | Status: DC | PRN
  Administered 2020-06-20: 100 ug via INTRAVENOUS
  Administered 2020-06-20 (×2): 50 ug via INTRAVENOUS

## 2020-06-20 MED ORDER — MORPHINE SULFATE (PF) 2 MG/ML IV SOLN: 2.0000 mg | INTRAVENOUS | Status: DC | PRN

## 2020-06-20 MED ORDER — FENTANYL CITRATE (PF) 100 MCG/2ML IJ SOLN
25.0000 ug | INTRAMUSCULAR | Status: DC | PRN
  Administered 2020-06-20 (×2): 50 ug via INTRAVENOUS

## 2020-06-20 MED ORDER — FENTANYL CITRATE (PF) 250 MCG/5ML IJ SOLN
INTRAMUSCULAR | Status: AC
  Filled 2020-06-20: qty 5

## 2020-06-20 MED ORDER — PHENYLEPHRINE HCL-NACL 10-0.9 MG/250ML-% IV SOLN
INTRAVENOUS | Status: DC | PRN
  Administered 2020-06-20: 50 ug/min via INTRAVENOUS

## 2020-06-20 MED ORDER — HYDRALAZINE HCL 20 MG/ML IJ SOLN: 10.0000 mg | INTRAMUSCULAR | Status: DC | PRN

## 2020-06-20 MED ORDER — ONDANSETRON HCL 4 MG/2ML IJ SOLN: 4.0000 mg | Freq: Four times a day (QID) | INTRAMUSCULAR | Status: DC | PRN

## 2020-06-20 MED ORDER — ORAL CARE MOUTH RINSE: 15.0000 mL | Freq: Once | OROMUCOSAL | Status: AC

## 2020-06-20 MED ORDER — MORPHINE SULFATE (PF) 2 MG/ML IV SOLN
2.0000 mg | Freq: Once | INTRAVENOUS | Status: AC
Start: 2020-06-20 — End: 2020-06-20
  Administered 2020-06-20: 2 mg via INTRAVENOUS
  Filled 2020-06-20: qty 1

## 2020-06-20 MED ORDER — IBUPROFEN 600 MG PO TABS: 600.0000 mg | ORAL_TABLET | Freq: Four times a day (QID) | ORAL | Status: DC | PRN

## 2020-06-20 MED ORDER — ALBUMIN HUMAN 5 % IV SOLN: INTRAVENOUS | Status: DC | PRN

## 2020-06-20 MED ORDER — ACETAMINOPHEN 10 MG/ML IV SOLN
INTRAVENOUS | Status: AC
  Filled 2020-06-20: qty 100

## 2020-06-20 MED ORDER — LIDOCAINE HCL (CARDIAC) PF 100 MG/5ML IV SOSY
PREFILLED_SYRINGE | INTRAVENOUS | Status: DC | PRN
  Administered 2020-06-20: 60 mg via INTRAVENOUS

## 2020-06-20 MED ORDER — SUGAMMADEX SODIUM 200 MG/2ML IV SOLN
INTRAVENOUS | Status: DC | PRN
  Administered 2020-06-20: 150 mg via INTRAVENOUS

## 2020-06-20 MED ORDER — HEPARIN SODIUM (PORCINE) 5000 UNIT/ML IJ SOLN
5000.0000 [IU] | Freq: Three times a day (TID) | INTRAMUSCULAR | Status: DC
  Administered 2020-06-21 – 2020-06-25 (×13): 5000 [IU] via SUBCUTANEOUS
  Filled 2020-06-20 (×13): qty 1

## 2020-06-20 MED ORDER — ONDANSETRON HCL 4 MG/2ML IJ SOLN
4.0000 mg | Freq: Once | INTRAMUSCULAR | Status: AC
  Administered 2020-06-20: 4 mg via INTRAVENOUS
  Filled 2020-06-20: qty 2

## 2020-06-20 MED ORDER — ACETAMINOPHEN 10 MG/ML IV SOLN
1000.0000 mg | Freq: Once | INTRAVENOUS | Status: DC | PRN
  Administered 2020-06-20: 1000 mg via INTRAVENOUS

## 2020-06-20 SURGICAL SUPPLY — 50 items
APL PRP STRL LF DISP 70% ISPRP (MISCELLANEOUS) ×1
BNDG GAUZE ELAST 4 BULKY (GAUZE/BANDAGES/DRESSINGS) ×1 IMPLANT
CHLORAPREP W/TINT 26 (MISCELLANEOUS) ×2 IMPLANT
CLIP VESOCCLUDE LG 6/CT (CLIP) ×1 IMPLANT
CLIP VESOCCLUDE MED 6/CT (CLIP) ×1 IMPLANT
CLIP VESOCCLUDE SM WIDE 6/CT (CLIP) ×1 IMPLANT
COVER SURGICAL LIGHT HANDLE (MISCELLANEOUS) ×2 IMPLANT
DRAPE LAPAROSCOPIC ABDOMINAL (DRAPES) ×2 IMPLANT
DRAPE WARM FLUID 44X44 (DRAPES) ×2 IMPLANT
DRSG OPSITE POSTOP 4X10 (GAUZE/BANDAGES/DRESSINGS) IMPLANT
DRSG OPSITE POSTOP 4X8 (GAUZE/BANDAGES/DRESSINGS) IMPLANT
DRSG PAD ABDOMINAL 8X10 ST (GAUZE/BANDAGES/DRESSINGS) ×1 IMPLANT
ELECT BLADE 6.5 EXT (BLADE) ×1 IMPLANT
ELECT CAUTERY BLADE 6.4 (BLADE) ×2 IMPLANT
ELECT REM PT RETURN 9FT ADLT (ELECTROSURGICAL) ×2
ELECTRODE REM PT RTRN 9FT ADLT (ELECTROSURGICAL) ×1 IMPLANT
GAUZE SPONGE 4X4 12PLY STRL (GAUZE/BANDAGES/DRESSINGS) ×1 IMPLANT
GLOVE SURG SS PI 7.0 STRL IVOR (GLOVE) ×4 IMPLANT
GLOVE SURG UNDER POLY LF SZ7 (GLOVE) ×2 IMPLANT
GOWN STRL REUS W/ TWL LRG LVL3 (GOWN DISPOSABLE) ×2 IMPLANT
GOWN STRL REUS W/TWL LRG LVL3 (GOWN DISPOSABLE) ×6
HANDLE SUCTION POOLE (INSTRUMENTS) ×1 IMPLANT
KIT BASIN OR (CUSTOM PROCEDURE TRAY) ×2 IMPLANT
KIT OSTOMY DRAINABLE 2.75 STR (WOUND CARE) ×2 IMPLANT
LIGASURE IMPACT 36 18CM CVD LR (INSTRUMENTS) ×1 IMPLANT
LOOP VESSEL MAXI BLUE (MISCELLANEOUS) IMPLANT
LOOP VESSEL MINI RED (MISCELLANEOUS) IMPLANT
NEEDLE 22X1 1/2 (OR ONLY) (NEEDLE) ×1 IMPLANT
PACK GENERAL/GYN (CUSTOM PROCEDURE TRAY) ×2 IMPLANT
PENCIL SMOKE EVACUATOR (MISCELLANEOUS) ×2 IMPLANT
RETRACTOR WND ALEXIS 25 LRG (MISCELLANEOUS) IMPLANT
RTRCTR WOUND ALEXIS 25CM LRG (MISCELLANEOUS) ×2
SPONGE LAP 18X18 RF (DISPOSABLE) ×3 IMPLANT
STAPLER CUT RELOAD GREEN (STAPLE) ×1 IMPLANT
STAPLER CVD CUT GN 40 RELOAD (ENDOMECHANICALS) ×2 IMPLANT
STAPLER CVD CUT GRN 40 RELOAD (ENDOMECHANICALS) IMPLANT
STAPLER VISISTAT 35W (STAPLE) ×2 IMPLANT
SUCTION POOLE HANDLE (INSTRUMENTS) ×2
SUT PDS AB 1 TP1 96 (SUTURE) ×1 IMPLANT
SUT SILK 2 0 (SUTURE) ×2
SUT SILK 2 0 SH CR/8 (SUTURE) ×2 IMPLANT
SUT SILK 2-0 18XBRD TIE 12 (SUTURE) ×1 IMPLANT
SUT SILK 3 0 (SUTURE) ×2
SUT SILK 3 0 SH CR/8 (SUTURE) ×2 IMPLANT
SUT SILK 3-0 18XBRD TIE 12 (SUTURE) ×1 IMPLANT
SUT VIC AB 3-0 SH 8-18 (SUTURE) ×1 IMPLANT
TAPE CLOTH SURG 6X10 WHT LF (GAUZE/BANDAGES/DRESSINGS) ×1 IMPLANT
TOWEL GREEN STERILE (TOWEL DISPOSABLE) ×2 IMPLANT
TRAY FOLEY MTR SLVR 16FR STAT (SET/KITS/TRAYS/PACK) ×2 IMPLANT
YANKAUER SUCT BULB TIP NO VENT (SUCTIONS) ×1 IMPLANT

## 2020-06-20 NOTE — Progress Notes (Signed)
Pharmacy Antibiotic Note  [name] is a 70 y.o. female admitted on 06/20/2020 with intra-abdominal infection.  Pharmacy has been consulted for Zosyn dosing. Patient received 1st dose of Zosyn 4/22 at 1620. Ceftriaxone/Flagyl also ordered pre-op.  Plan: D/c ceftriaxone/Flagyl Zosyn 3.375g IV q8h (4h infusion) already ordered appropriately - will continue Monitor clinical progress, c/s, renal function F/u de-escalation plan/LOT   Height: 5\' 5"  (165.1 cm) Weight: 55.3 kg (122 lb) IBW/kg (Calculated) : 57  Temp (24hrs), Avg:100.1 F (37.8 C), Min:100.1 F (37.8 C), Max:100.1 F (37.8 C)  Recent Labs  Lab 06/20/20 0947 06/20/20 1601  WBC 14.2*  --   CREATININE 1.01*  --   LATICACIDVEN  --  1.0    Estimated Creatinine Clearance: 45.9 mL/min (A) (by C-G formula based on SCr of 1.01 mg/dL (H)).    Allergies  Allergen Reactions  . Lyrica [Pregabalin] Other (See Comments)    Extreme dry mouth, dizziness  . Tramadol Nausea And Vomiting and Other (See Comments)    Dizziness    Arturo Morton, PharmD, BCPS Please check AMION for all [facility] contact numbers Clinical Pharmacist 06/20/2020 8:58 PM

## 2020-06-20 NOTE — H&P (Addendum)
Admission Note  [name] [birth year]  [id number].    Requesting MD: Dr. Daleen Bo Chief Complaint/Reason for Consult: pneumoperitoneum  HPI:  Patient is a 70 year old female who presented to [facility] with abdominal pain x3 days. Patient reports left sided abdominal pain, worst in the LLQ with associated anorexia, diarrhea and emesis after any PO intake. Stool reported to be dark in color. Her pain worsened today which prompted her to present to the ED for evaluation. She denies experiencing similar pain previously. No prior abdominal surgery.   In the ED she underwent CTA A/P that showed Scattered pneumoperitoneum compatible with perforated viscus, location cannot be accurately determined but favored to either be from left descending colon diverticulitis versus a small bowel source in the left mid abdomen. WBC 14.2. Tmax 100.1. Tachycardic in the 110's. We were asked to see.   PMH otherwise significant for HTN, hx of hyperglycemia, osteoarthritis.  Blood thinner use: None Last PO intake: 4/21 AM - chicken broth  Last colonoscopy, 11/30/2020 by Dr. Derrill Memo that showed multiple medium-mouthed diverticula in the left colon - otherwise without abnormalities.   ROS: Review of Systems  Constitutional: Negative for chills, fever and weight loss.  Respiratory: Negative for shortness of breath.   Cardiovascular: Negative for chest pain.  Gastrointestinal: Positive for abdominal pain, blood in stool, diarrhea, nausea and vomiting.  Psychiatric/Behavioral: Negative for substance abuse.  All other systems reviewed and are negative.   Family History  Problem Relation Age of Onset  . Cancer Father        mesothelioma  . Diabetes Mother   . Hypertension Mother   . Hypertension Other   . Breast cancer Paternal Aunt 47  . Ovarian cancer Maternal Aunt        late 38s  . Lupus Daughter   . Breast cancer Paternal Aunt   . Colon cancer Neg Hx   . Prostate cancer Neg Hx   . Colon  polyps Neg Hx   . Esophageal cancer Neg Hx   . Rectal cancer Neg Hx   . Stomach cancer Neg Hx     Past Medical History:  Diagnosis Date  . Allergy   . ANEMIA-NOS 11/07/2007   past hx   . Cancer [facility])    Breast- right-   . Family history of breast cancer   . Family history of ovarian cancer   . HIP PAIN 02/06/2010  . HYPERGLYCEMIA 02/06/2010  . HYPERTENSION 11/07/2007  . OSTEOARTHRITIS 11/07/2007  . Osteopenia   . PONV (postoperative nausea and vomiting)   . URI 08/08/2009    Past Surgical History:  Procedure Laterality Date  . COLONOSCOPY    . COLONOSCOPY    . POLYPECTOMY    . SIMPLE MASTECTOMY Right 05/23/2018   SENTINAL NODE MAPPING   . SIMPLE MASTECTOMY WITH AXILLARY SENTINEL NODE BIOPSY Right 05/23/2018   Procedure: RIGHT SIMPLE MASTECTOMY WITH  RIGHT SENTINEL NODE MAPPING;  Surgeon: Erroll Luna, MD;  Location: [facility];  Service: General;  Laterality: Right;  Marland Kitchen VAGINAL HYSTERECTOMY  03/12/2008   with BSO    Social History:  reports that she quit smoking about 47 years ago. She has never used smokeless tobacco. She reports that she does not drink alcohol and does not use drugs. Quit smoking 47 years ago. No current tobacco use No alcohol use No illicit drug use Retired. Previously in retail.  Lives at home with her husband who is at bedside currently  Allergies:  Allergies  Allergen Reactions  . Lyrica [Pregabalin] Other (See Comments)    Extreme dry mouth, dizziness  . Tramadol Nausea And Vomiting and Other (See Comments)    Dizziness    (Not in a hospital admission)   Blood pressure 116/79, pulse (!) 112, temperature 100.1 F (37.8 C), temperature source Oral, resp. rate (!) 30, height 5\' 5"  (1.651 m), weight 55.3 kg, SpO2 93 %. Physical Exam: General: pleasant, WD, female who is laying in bed in NAD HEENT: head is normocephalic, atraumatic.  Sclera are noninjected.  PERRL.  Ears and nose without any masses or lesions.  Mouth is pink and moist, dentition fair.   Heart: Tachycardic with regular rhythm.  Normal s1,s2. No obvious murmurs, gallops, or rubs noted.  Palpable radial and pedal pulses bilaterally Lungs: CTAB, no wheezes, rhonchi, or rales noted.  Respiratory effort nonlabored Abd: Mild distension, generalized tenderness with guarding of the LLQ and LUQ. +BS. No masses, hernias, or organomegaly. No prior abdominal scars noted.  MS: all 4 extremities are symmetrical with no cyanosis, clubbing, or edema. Skin: warm and dry with no masses, lesions, or rashes Neuro: Cranial nerves 2-12 grossly intact, sensation is normal throughout, moves all extremities, gait not assessed.  Psych: A&Ox3 with an appropriate affect.  Results for orders placed or performed during the hospital encounter of 06/20/20 (from the past 48 hour(s))  Urinalysis, Routine w reflex microscopic Urine, Clean Catch     Status: Abnormal   Collection Time: 06/20/20  9:41 AM  Result Value Ref Range   Color, Urine AMBER (A) YELLOW    Comment: BIOCHEMICALS MAY BE AFFECTED BY COLOR   APPearance CLOUDY (A) CLEAR   Specific Gravity, Urine 1.020 1.005 - 1.030   pH 5.0 5.0 - 8.0   Glucose, UA NEGATIVE NEGATIVE mg/dL   Hgb urine dipstick SMALL (A) NEGATIVE   Bilirubin Urine NEGATIVE NEGATIVE   Ketones, ur 80 (A) NEGATIVE mg/dL   Protein, ur 100 (A) NEGATIVE mg/dL   Nitrite NEGATIVE NEGATIVE   Leukocytes,Ua MODERATE (A) NEGATIVE   RBC / HPF 11-20 0 - 5 RBC/hpf   WBC, UA >50 (H) 0 - 5 WBC/hpf   Bacteria, UA FEW (A) NONE SEEN   Squamous Epithelial / LPF 0-5 0 - 5   Mucus PRESENT    Hyaline Casts, UA PRESENT    Granular Casts, UA PRESENT    Non Squamous Epithelial 0-5 (A) NONE SEEN    Comment: Performed at [facility], [address], [place], [place] [address]  Lipase, blood     Status: None   Collection Time: 06/20/20  9:47 AM  Result Value Ref Range   Lipase 21 11 - 51 U/L    Comment: Performed at [facility] [address] [place], [place] [address]   Comprehensive metabolic panel     Status: Abnormal   Collection Time: 06/20/20  9:47 AM  Result Value Ref Range   Sodium 136 135 - 145 mmol/L   Potassium 3.5 3.5 - 5.1 mmol/L   Chloride 101 98 - 111 mmol/L   CO2 24 22 - 32 mmol/L   Glucose, Bld 106 (H) 70 - 99 mg/dL    Comment: Glucose reference range applies only to samples taken after fasting for at least 8 hours.   BUN 14 8 - 23 mg/dL   Creatinine, Ser 1.01 (H) 0.44 - 1.00 mg/dL   Calcium 9.2 8.9 - 10.3 mg/dL   Total Protein 6.9 6.5 - 8.1 g/dL  Albumin 3.3 (L) 3.5 - 5.0 g/dL   AST 17 15 - 41 U/L   ALT 14 0 - 44 U/L   Alkaline Phosphatase 102 38 - 126 U/L   Total Bilirubin 1.3 (H) 0.3 - 1.2 mg/dL   GFR, Estimated >60 >60 mL/min    Comment: (NOTE) Calculated using the CKD-EPI Creatinine Equation (2021)    Anion gap 11 5 - 15    Comment: Performed at [facility] [address], [place], [place] [address]  CBC     Status: Abnormal   Collection Time: 06/20/20  9:47 AM  Result Value Ref Range   WBC 14.2 (H) 4.0 - 10.5 K/uL   RBC 4.45 3.87 - 5.11 MIL/uL   Hemoglobin 12.3 12.0 - 15.0 g/dL   HCT 39.1 36.0 - 46.0 %   MCV 87.9 80.0 - 100.0 fL   MCH 27.6 26.0 - 34.0 pg   MCHC 31.5 30.0 - 36.0 g/dL   RDW 14.1 11.5 - 15.5 %   Platelets 274 150 - 400 K/uL   nRBC 0.0 0.0 - 0.2 %    Comment: Performed at [facility], [place] [address] [place], [place] [address]  POC occult blood, ED     Status: Abnormal   Collection Time: 06/20/20 10:20 AM  Result Value Ref Range   Fecal Occult Bld POSITIVE (A) NEGATIVE   CT Angio Abd/Pel W and/or Wo Contrast  Result Date: 06/20/2020 CLINICAL DATA:  Lower abdominal pain, diarrhea for 3 days EXAM: CT ANGIOGRAPHY ABDOMEN AND PELVIS WITH CONTRAST AND WITHOUT CONTRAST TECHNIQUE: Multidetector CT imaging of the abdomen and pelvis was performed using the standard protocol during bolus administration of intravenous contrast. Multiplanar reconstructed images and MIPs were obtained and reviewed  to evaluate the vascular anatomy. CONTRAST:  157mL OMNIPAQUE IOHEXOL 350 MG/ML SOLN COMPARISON:  None. FINDINGS: VASCULAR Aorta: Intact aorta. Very minimal atherosclerotic change. No aneurysm or dissection. No occlusive process. negative for rupture or retroperitoneal hemorrhage. No surrounding inflammatory aortic process. Celiac: Remains patent including its branches SMA: Remains patent including its branches Renals: Main renal arteries remain widely patent. No accessory renal artery. IMA: Origin remains patent.  Branches are not well demonstrated. Inflow: Iliac vasculature remains widely patent. The common, internal and external iliac arteries are all patent. No occlusive process or inflow disease. Proximal Outflow: Common femoral, proximal profunda femoral, and proximal superficial femoral arteries are all patent. Veins: No veno-occlusive process. Review of the MIP images confirms the above findings. NON-VASCULAR Lower chest: Minor dependent basilar hypoventilatory changes. Normal heart size. No pericardial effusion. Hepatobiliary: Anterior hepatic subcapsular cyst measures 3 cm. No biliary dilatation. Hepatic and portal veins are patent. Gallbladder unremarkable. Pancreas: Slight prominence of the pancreatic duct, nonspecific. No focal pancreas abnormality or surrounding inflammatory process. Spleen: Normal in size without focal abnormality. Adrenals/Urinary Tract: Adrenal glands are unremarkable. Kidneys are normal, without renal calculi, focal lesion, or hydronephrosis. Bladder is unremarkable. Stomach/Bowel: There are numerous small scattered foci of free air throughout the anterior mid abdomen at the level of the umbilicus along the omentum, images 32 through 48 series 8. Small amount of free fluid focally along the left mid abdominal omentum, image 41 series 8. There is left abdominal localized inflammation in the mesenteric fat in the left lower quadrant with adjacent loops of small bowel and colon. There  is diverticular disease throughout the colon. By CT, it is difficult to localize the source of perforation but suspect either related to left descending colon diverticulitis  versus a left abdominal small bowel source. Scattered small foci of free air also suspected along the small bowel in the pelvis, image 56 series 8. Small amount of dependent free fluid in the pelvis as well. No significant drainable abscess or fluid collection. Appendix is nondilated containing hyperdense material overlying the iliac vessels. Lymphatic: No bulky adenopathy. Reproductive: Remote hysterectomy.  No other adnexal abnormality. Other: Intact abdominal wall.  Negative for hernia. Musculoskeletal: Degenerative changes of the spine and hip joints. IMPRESSION: VASCULAR Intact aorta without evidence of acute vascular finding. Negative for aneurysm or dissection. Patent mesenteric vasculature. Scattered areas of hyperdense material in the colon compatible with ingested material. No evidence of active arterial GI bleeding. NON-VASCULAR Scattered pneumoperitoneum compatible with perforated viscus, location cannot be accurately determined but favored to either be from left descending colon diverticulitis versus a small bowel source in the left mid abdomen. These results were called by telephone at the time of interpretation on 06/20/2020 at 2:13 pm to provider [facility] , who verbally acknowledged these results. Electronically Signed   By: Jerilynn Mages.  Shick M.D.   On: 06/20/2020 14:15   Anti-infectives (From admission, onward)   None      Assessment/Plan HTN Hyperglycemia  OA  Pneumoperitoneum  This is a 70 y.o. female who presented with 3 days of left sided abdominal pain that acutely worsened today. In the ED she underwent CTA A/P that showed scattered pneumoperitoneum compatible with perforated viscus. The loc ation could not be accurately determined but favored to either be from left descending colon diverticulitis versus a small  bowel source in the left mid abdomen. Her WBC was 14.2. Tmax 100.1. She is currently tachycardic in the 110's. On exam the patient has generalized abdominal tenderness that is greatest on the left with associated guarding. Given above findings, recommend OR for exploratory laparotomy. Risks of surgery discussed with patient and her husband by Dr. Kieth Brightly. Patient in agreement with plan to proceed to the OR today. Admit to inpatient. Start IV abx.   Jillyn Ledger, [facility] Surgery 06/20/2020, 2:26 PM Please see Amion for pager number during day hours 7:00am-4:30pm

## 2020-06-20 NOTE — ED Notes (Signed)
Patient transported to CT 

## 2020-06-20 NOTE — ED Provider Notes (Signed)
[facility] EMERGENCY DEPARTMENT Provider Note   CSN: [id number] Arrival date & time: 06/20/20  0935     History Chief Complaint  Patient presents with  . Abdominal Pain    [name] is a 70 y.o. female.  HPI 70 year old female with a history of breast cancer, hyperglycemia, hypertension presents to the ER with complaints of abdominal pain, nausea, vomiting, black/greenish stools.  She reports severe abdominal pain which has been progressively worsening since Wednesday.  Reports even the lightest touch is painful.  She endorses nonbloody nonbilious vomiting, and anorexia with inability to tolerate any p.o. fluids.  She denies any dysuria.  She has had a vaginal hysterectomy but no other abdominal surgeries.  No fevers but states that she has felt chilled    Past Medical History:  Diagnosis Date  . Allergy   . ANEMIA-NOS 11/07/2007   past hx   . Cancer [facility])    Breast- right-   . Family history of breast cancer   . Family history of ovarian cancer   . HIP PAIN 02/06/2010  . HYPERGLYCEMIA 02/06/2010  . HYPERTENSION 11/07/2007  . OSTEOARTHRITIS 11/07/2007  . Osteopenia   . PONV (postoperative nausea and vomiting)   . URI 08/08/2009    Patient Active Problem List   Diagnosis Date Noted  . Vitamin D deficiency 07/24/2019  . Neoplasm of right breast, 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  . Biceps tendon rupture 05/10/2018  . Genetic testing 05/08/2018  . Family history of breast cancer   . Family history of ovarian cancer   . 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 Mass of left thigh 07/14/2017  . Hematoma of left thigh 03/18/2015  . Pain in joint, lower leg 04/05/2014  . Hand pain 03/29/2012  . Fibromyalgia 03/29/2012  . Well adult exam 03/17/2011  . HIP PAIN 02/06/2010  . HYPERGLYCEMIA 02/06/2010  . URI 08/08/2009  . ANEMIA-NOS 11/07/2007  . Essential hypertension 11/07/2007  .  Osteoarthritis 11/07/2007    Past Surgical History:  Procedure Laterality Date  . COLONOSCOPY    . COLONOSCOPY    . POLYPECTOMY    . SIMPLE MASTECTOMY Right 05/23/2018   SENTINAL NODE MAPPING   . SIMPLE MASTECTOMY WITH AXILLARY SENTINEL NODE BIOPSY Right 05/23/2018   Procedure: RIGHT SIMPLE MASTECTOMY WITH  RIGHT SENTINEL NODE MAPPING;  Surgeon: Erroll Luna, MD;  Location: [facility];  Service: General;  Laterality: Right;  Marland Kitchen VAGINAL HYSTERECTOMY  03/12/2008   with BSO     OB History   No obstetric history on file.     Family History  Problem Relation Age of Onset  . Cancer Father        mesothelioma  . Diabetes Mother   . Hypertension Mother   . Hypertension Other   . Breast cancer Paternal Aunt 12  . Ovarian cancer Maternal Aunt        late 70s  . Lupus Daughter   . Breast cancer Paternal Aunt   . Colon cancer Neg Hx   . Prostate cancer Neg Hx   . Colon polyps Neg Hx   . Esophageal cancer Neg Hx   . Rectal cancer Neg Hx   . Stomach cancer Neg Hx     Social History   Tobacco Use  . Smoking status: Former Smoker    Quit date: 03/01/1973    Years since quitting: 47.3  . Smokeless tobacco: Never Used  Vaping Use  .  Vaping Use: Never used  Substance Use Topics  . Alcohol use: No  . Drug use: No    Home Medications Prior to Admission medications   Medication Sig Start Date End Date Taking? Authorizing Provider  amLODipine (NORVASC) 2.5 MG tablet Take 1 tablet (2.5 mg total) by mouth daily. 07/24/19   Plotnikov, Evie Lacks, MD  Cholecalciferol (VITAMIN D3) 125 MCG (5000 UT) CAPS Take 5,000 Units by mouth daily. 03/01/13   [provider]  Javier Docker Oil 1000 MG CAPS Take 1,000 mg by mouth daily.    [provider]  meloxicam (MOBIC) 15 MG tablet Take 1 tablet (15 mg total) by mouth daily. 07/24/19   Plotnikov, Evie Lacks, MD  Multiple Vitamin (MULTIVITAMIN) tablet Take 1 tablet by mouth daily.    [provider]  Olive Leaf Extract 250 MG CAPS  Take 700 mg by mouth daily.    [provider]  OVER THE COUNTER MEDICATION Take 1 tablet by mouth daily. tumeric    [provider]    Allergies    Lyrica [pregabalin] and Tramadol  Review of Systems   Review of Systems  Constitutional: Negative for chills and fever.  HENT: Negative for ear pain and sore throat.   Eyes: Negative for pain and visual disturbance.  Respiratory: Negative for cough and shortness of breath.   Cardiovascular: Negative for chest pain and palpitations.  Gastrointestinal: Positive for abdominal pain, diarrhea, nausea and vomiting.  Genitourinary: Negative for dysuria and hematuria.  Musculoskeletal: Negative for arthralgias and back pain.  Skin: Negative for color change and rash.  Neurological: Negative for seizures and syncope.  All other systems reviewed and are negative.   Physical Exam Updated Vital Signs BP (!) 123/109   Pulse (!) 118   Temp 100.1 F (37.8 C) (Oral)   Resp (!) 23   Ht 5\' 5"  (1.651 m)   Wt 55.3 kg   SpO2 94%   BMI 20.30 kg/m   Physical Exam Vitals and nursing note reviewed.  Constitutional:      General: She is not in acute distress.    Appearance: She is well-developed.     Comments: Uncomfortable appearing  HENT:     Head: Normocephalic and atraumatic.  Eyes:     Conjunctiva/sclera: Conjunctivae normal.  Cardiovascular:     Rate and Rhythm: Normal rate and regular rhythm.     Heart sounds: No murmur heard.   Pulmonary:     Effort: Pulmonary effort is normal. No respiratory distress.     Breath sounds: Normal breath sounds.  Abdominal:     Palpations: Abdomen is soft.     Tenderness: There is generalized abdominal tenderness. There is guarding. There is no right CVA tenderness, left CVA tenderness or rebound. Negative signs include Murphy's sign and McBurney's sign.     Hernia: No hernia is present.     Comments: Abdomen exquisitely tender to even to the lightest touch to the central abdomen  with guarding.  No focal tenderness.  Bowel sounds present.  Musculoskeletal:     Cervical back: Neck supple.  Skin:    General: Skin is warm and dry.  Neurological:     General: No focal deficit present.     Mental Status: She is alert.  Psychiatric:        Mood and Affect: Mood normal.        Behavior: Behavior normal.     ED Results / Procedures / Treatments   Labs (all labs ordered  are listed, but only abnormal results are displayed) Labs Reviewed  COMPREHENSIVE METABOLIC PANEL - Abnormal; Notable for the following components:      Result Value   Glucose, Bld 106 (*)    Creatinine, Ser 1.01 (*)    Albumin 3.3 (*)    Total Bilirubin 1.3 (*)    All other components within normal limits  CBC - Abnormal; Notable for the following components:   WBC 14.2 (*)    All other components within normal limits  URINALYSIS, ROUTINE W REFLEX MICROSCOPIC - Abnormal; Notable for the following components:   Color, Urine AMBER (*)    APPearance CLOUDY (*)    Hgb urine dipstick SMALL (*)    Ketones, ur 80 (*)    Protein, ur 100 (*)    Leukocytes,Ua MODERATE (*)    WBC, UA >50 (*)    Bacteria, UA FEW (*)    Non Squamous Epithelial 0-5 (*)    All other components within normal limits  POC OCCULT BLOOD, ED - Abnormal; Notable for the following components:   Fecal Occult Bld POSITIVE (*)    All other components within normal limits  RESP PANEL BY RT-PCR (FLU A&B, COVID) ARPGX2  LIPASE, BLOOD  LACTIC ACID, PLASMA  LACTIC ACID, PLASMA  TYPE AND SCREEN    EKG None  Radiology CT Angio Abd/Pel W and/or Wo Contrast  Result Date: 06/20/2020 CLINICAL DATA:  Lower abdominal pain, diarrhea for 3 days EXAM: CT ANGIOGRAPHY ABDOMEN AND PELVIS WITH CONTRAST AND WITHOUT CONTRAST TECHNIQUE: Multidetector CT imaging of the abdomen and pelvis was performed using the standard protocol during bolus administration of intravenous contrast. Multiplanar reconstructed images and MIPs were obtained and  reviewed to evaluate the vascular anatomy. CONTRAST:  150mL OMNIPAQUE IOHEXOL 350 MG/ML SOLN COMPARISON:  None. FINDINGS: VASCULAR Aorta: Intact aorta. Very minimal atherosclerotic change. No aneurysm or dissection. No occlusive process. negative for rupture or retroperitoneal hemorrhage. No surrounding inflammatory aortic process. Celiac: Remains patent including its branches SMA: Remains patent including its branches Renals: Main renal arteries remain widely patent. No accessory renal artery. IMA: Origin remains patent.  Branches are not well demonstrated. Inflow: Iliac vasculature remains widely patent. The common, internal and external iliac arteries are all patent. No occlusive process or inflow disease. Proximal Outflow: Common femoral, proximal profunda femoral, and proximal superficial femoral arteries are all patent. Veins: No veno-occlusive process. Review of the MIP images confirms the above findings. NON-VASCULAR Lower chest: Minor dependent basilar hypoventilatory changes. Normal heart size. No pericardial effusion. Hepatobiliary: Anterior hepatic subcapsular cyst measures 3 cm. No biliary dilatation. Hepatic and portal veins are patent. Gallbladder unremarkable. Pancreas: Slight prominence of the pancreatic duct, nonspecific. No focal pancreas abnormality or surrounding inflammatory process. Spleen: Normal in size without focal abnormality. Adrenals/Urinary Tract: Adrenal glands are unremarkable. Kidneys are normal, without renal calculi, focal lesion, or hydronephrosis. Bladder is unremarkable. Stomach/Bowel: There are numerous small scattered foci of free air throughout the anterior mid abdomen at the level of the umbilicus along the omentum, images 32 through 48 series 8. Small amount of free fluid focally along the left mid abdominal omentum, image 41 series 8. There is left abdominal localized inflammation in the mesenteric fat in the left lower quadrant with adjacent loops of small bowel and  colon. 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 By CT, it is difficult to localize the source of perforation but suspect either related to left descending colon diverticulitis versus a left abdominal small bowel source. Scattered small foci  of free air also suspected along the small bowel in the pelvis, image 56 series 8. Small amount of dependent free fluid in the pelvis as well. No significant drainable abscess or fluid collection. Appendix is nondilated containing hyperdense material overlying the iliac vessels. Lymphatic: No bulky adenopathy. Reproductive: Remote hysterectomy.  No other adnexal abnormality. Other: Intact abdominal wall.  Negative for hernia. Musculoskeletal: Degenerative changes of the spine and hip joints. IMPRESSION: VASCULAR Intact aorta without evidence of acute vascular finding. Negative for aneurysm or dissection. Patent mesenteric vasculature. Scattered areas of hyperdense material in the colon compatible with ingested material. No evidence of active arterial GI bleeding. NON-VASCULAR Scattered pneumoperitoneum compatible with perforated viscus, location cannot be accurately determined but favored to either be from left descending colon diverticulitis versus a small bowel source in the left mid abdomen. These results were called by telephone at the time of interpretation on 06/20/2020 at 2:13 pm to provider [facility] , who verbally acknowledged these results. Electronically Signed   By: Jerilynn Mages.  Shick M.D.   On: 06/20/2020 14:15    Procedures Procedures   Medications Ordered in ED Medications  piperacillin-tazobactam (ZOSYN) IVPB 3.375 g (has no administration in time range)  morphine 2 MG/ML injection 2 mg (has no administration in time range)  ondansetron (ZOFRAN) injection 4 mg (has no administration in time range)  0.9 %  sodium chloride infusion (has no administration in time range)  morphine 2 MG/ML injection 2 mg (2 mg Intravenous Given 06/20/20 1135)  sodium  chloride 0.9 % bolus 1,000 mL (0 mLs Intravenous Stopped 06/20/20 1401)  sodium chloride 0.9 % bolus 1,000 mL (1,000 mLs Intravenous New Bag/Given 06/20/20 1401)  iohexol (OMNIPAQUE) 350 MG/ML injection 100 mL (100 mLs Intravenous Contrast Given 06/20/20 1259)    ED Course  I have reviewed the triage vital signs and the nursing notes.  Pertinent labs & imaging results that were available during my care of the patient were reviewed by me and considered in my medical decision making (see chart for details).    MDM Rules/Calculators/A&P                          70 year old female presents with abdominal pain x2 days.  On arrival, she is borderline febrile with a temperature of 100.1, tachycardic at 123, no evidence of hypoxia or hypotension.  Her physical exam shows exquisite tenderness to the abdomen, portion to exam.  She does have some guarding as well.  No CVA tenderness.  Rectal exam with some blackish/greenish stool.  He was fecal occult positive.  Her CMP is largely unremarkable, mildly elevated creatinine 1.01, normal LFTs.  She does have a leukocytosis of 14.2 here, normal lipase.  UA with moderate leukocytes, more than 50 WBCs few bacteria.  She does not any urinary symptoms at this time.  Dx includes diverticulitis, viral gastroenteritis, appendicitis, mesenteric ischemia, small bowel obstruction  Given significant pain on exam, CTA of the abdomen and pelvis was ordered.  This showed scattered pneumoperitoneum compatible with a perforated viscus though the location cannot be accurately determined, favored left descending colonic diverticulum versus small bowel.   Consulted general surgery Alferd Apa who saw and evaluated the patient.  Plan for exploratory laparotomy.  Per general surgery, will admit to inpatient medicine.  Remains hemodynamically stable at this time.  This was a shared visit with my supervising physician Dr. Eulis Foster who independently saw and evaluated the patient &  provided guidance in evaluation/management/disposition ,  in agreement with care  Final Clinical Impression(s) / ED Diagnoses Final diagnoses:  Perforated abdominal viscus    Rx / DC Orders ED Discharge Orders    None       Lyndel Safe 06/20/20 1525    Daleen Bo, MD 06/21/20 [phone]

## 2020-06-20 NOTE — ED Provider Notes (Signed)
  Face-to-face evaluation   History: She presents for evaluation abdominal pain that has been present for 3 days, persistently.  This is caused her to not be able to eat because "I do not have an appetite."  Yesterday she had multiple episodes of diarrhea, stated to be "pasty," in color.  No prior similar problems.  She states she vomits after eating or drinking.  No history of abdominal surgery.  She denies fever  Physical exam: Elderly, alert and cooperative.  Abdomen is exquisitely tender in the middle aspect, without rebound tenderness.  She does guard.  No respiratory distress.  Medical screening examination/treatment/procedure(s) were conducted as a shared visit with non-physician practitioner(s) and myself.  I personally evaluated the patient during the encounter    Daleen Bo, MD 06/21/20 [phone]

## 2020-06-20 NOTE — ED Triage Notes (Signed)
Pt reports lower abd pain for the past 3 days with blackish-green stool, n/v. Pt tearful in triage. No urinary s/s.

## 2020-06-20 NOTE — Op Note (Addendum)
06/20/2020  8:51 PM  PATIENT:  [name]  70 y.o. female  Patient Care Team: Plotnikov, Evie Lacks, MD as PCP - General Everlene Farrier, MD as Attending Physician (Obstetrics and Gynecology) Ladene Artist, MD (Gastroenterology) Mauro Kaufmann, RN as Oncology Nurse Navigator Rockwell Germany, RN as Oncology Nurse Navigator Erroll Luna, MD as Consulting Physician (General Surgery) Magrinat, Virgie Dad, MD as Consulting Physician (Oncology) Kyung Rudd, MD as Consulting Physician (Radiation Oncology)  PRE-OPERATIVE DIAGNOSIS:  Perforated diverticulitis  POST-OPERATIVE DIAGNOSIS: Perforated diverticulitis at descending/sigmoid junction  PROCEDURE:   1. Exploratory laparotomy with sigmoidectomy 2. Takedown of splenic flexure 3. Partial omentectomy 4. Creation of end descending colostomy  SURGEON:  Nadeen Landau, MD  ANESTHESIA:   general  COUNTS:  Sponge, needle and instrument counts were reported correct x2 at the conclusion of the operation.  EBL: 50 mL  DRAINS: None  SPECIMEN: 1. Sigmoid colon - stitch distal 2. Omentum  COMPLICATIONS: none  FINDINGS: Purulent and feculent peritonitis.  Apparent perforation of the proximalmost sigmoid colon with the junction of the descending colon.  On entry there was liquid black stool present throughout the abdomen.  She had a significant inflammatory response related to her diverticulitis.  The segment of colon was resected.  To facilitate reach, the splenic flexure was mobilized.  An end descending colostomy was fashioned in the left upper quadrant.  DISPOSITION: PACU in satisfactory condition  DESCRIPTION: The patient was identified in preop holding and taken to the OR where she was placed on the operating room table. SCDs were placed.  Pressure points were then evaluated and padded. General endotracheal anesthesia was induced without difficulty.  A Foley catheter was placed by nursing under sterile conditions.  Hair on  the abdomen was clipped.  She was then prepped and draped in the usual sterile fashion. A surgical timeout was performed indicating the correct patient, procedure, positioning and need for preoperative antibiotics.   A low midline incision was created and subcutaneous tissue divided with electrocautery.  The fascia was incised in the midline.  Peritoneum was cautiously entered bluntly.  The wound was then opened the length of the incision.  There was liquid black stool immediately exuding.  This was evacuated with the suction irrigator device.  An Alma wound protector was placed.  The Balfour retractor was placed.  Ultimately, a supraumbilical extension is necessary to facilitate exposure.  The omentum was coated in fibrinous exudate and black stool.  This cannot be easily removed and there were large particles trapped within the omentum.  Therefore, a partial omentectomy was carried out using the LigaSure device.  The small bowel was inspected from the ligament of Treitz to the ileocecal valve and was normal in appearance with the exception of some fibrinous exudate where the bowel was lying next to her sigmoid colon.  The cecum and ascending colon were normal in appearance.  The appendix is normal in appearance.  The apparent problem was located at the junction of her descending and sigmoid colon.  There was a significant inflammatory reaction.  This was the site of the perforation.  Her uterus is surgically absent.  The small bowel was then retracted out of the pelvis and left lower quadrant using 2 moist laparotomy pads.  The sigmoid colon was mobilized off the intersigmoid fossa.  The left ureter was identified and protected free of injury.  The distal point of transection was identified where the tinea had splayed.  A window was created in the mesentery.  A contoured stapler with a green load was utilized to divide the sigmoid/rectum junction.  The descending colon was then mobilized by incising the  Solace Manwarren line of Toldt.  The proximal point of planned transection was on the descending colon.  At this location, the colon is clearly healthy.  A window was created the mesentery here and the colon divided using a green load of the contour stapler as well.  The intervening mesentery was then divided after again confirming the location of the left ureter.  This mesentery was divided with the LigaSure device.  The cut edge of the mesentery was inspected and noted to be hemostatic.  In order for a colostomy to reach into her left upper quadrant, the descending colon had to be fully mobilized.  After doing this, we still had an adequate reach.  Therefore, the splenic flexure was taken down and approaching it from a lateral to medial fashion.  After completely mobilized the splenic flexure, her colon easily reaches up to the abdominal wall.  The abdomen is then copiously irrigated with warm saline until the effluent runs clear.  Hemostasis is verified.  The planned colostomy site is identified in the left upper quadrant.  A wheal of skin is excised in the associated subcutaneous tissue removed.  The fascia was incised longitudinally and the rectus fibers spread.  The posterior sheath was then incised of the rectus fascia.  This is dilated to approximately 2 fingerbreadths.  The colostomy is able to fit snugly through this defect.  Orientation is confirmed such that there is no twisting of the colostomy.  The midline fascia is then closed using 2 running #1 looped PDS sutures tied centrally.  Sponge, needle, and instrument counts have been reported correct x2.  The skin of the umbilicus is approximated loosely with 3 staples but the entire length of the wound is intentionally left open given the significant contamination of the procedure.  A moist Kerlix was placed over this.  Attention was turned to maturing the colostomy.  The staple line was excised.  There is excellent perfusion as there is some pulsatile mucosal  bleeding appreciated.  This is controlled with electrocautery.  The colostomy is then matured using 3-0 Vicryl sutures in a partial brooking manner.  A colostomy appliance was then cut to fit.  Final counts were reported correct.  She is then awakened from anesthesia, extubated, and transferred to a stretcher for transport to PACU in satisfactory condition.

## 2020-06-20 NOTE — ED Notes (Signed)
Informed Maria - PA of pt's POSITIVE occult blood result.

## 2020-06-20 NOTE — Anesthesia Procedure Notes (Signed)
Procedure Name: Intubation Date/Time: 06/20/2020 6:52 PM Performed by: Valetta Fuller, CRNA Pre-anesthesia Checklist: Patient identified, Emergency Drugs available, Suction available and Patient being monitored Patient Re-evaluated:Patient Re-evaluated prior to induction Oxygen Delivery Method: Circle system utilized Preoxygenation: Pre-oxygenation with 100% oxygen Induction Type: IV induction, Rapid sequence and Cricoid Pressure applied Laryngoscope Size: Miller and 2 Grade View: Grade I Tube type: Oral Tube size: 7.0 mm Number of attempts: 1 Airway Equipment and Method: Stylet Placement Confirmation: ETT inserted through vocal cords under direct vision,  positive ETCO2 and breath sounds checked- equal and bilateral Secured at: 22 cm Tube secured with: Tape Dental Injury: Teeth and Oropharynx as per pre-operative assessment

## 2020-06-20 NOTE — Telephone Encounter (Signed)
[facility] FYI:  ---Caller states she is having abdominal pain that started on Wed. She has some diarrhea but is no longer vomiting. No fever  Advised to go to ED now, patient understood

## 2020-06-20 NOTE — Progress Notes (Signed)
Received to 6n25 from PACU at this time.

## 2020-06-20 NOTE — Progress Notes (Signed)
I am taking over as the surgeon on call tonight. Dr. Kieth Brightly has reviewed this patient with me.  She has a 3d hx of progressive worsening abdominal pain on the left side with associated n/v, diarrhea and complete loss of appetite. She denies ever having had anything like this before.   She fortunately had a colonoscopy 05/2018 with Dr. Fuller Plan that showed diverticulosis in the left colon but no other mucosal abnormalities.  AF, tachycardic, uncomfortable Regular rhythm Normal work of breathing Abdomen is mildly distended; exquisitely tender throughout with rebound and involuntary guarding in all quadrants.  WBC 14 CT reviewed shows scattered pneumoperitoneum and some free fluid. Source suspected to be hollow viscus - distal descending colon vs neighboring small bowel. No extravasation of IV contrast as this was a CTA study.  -The anatomy and physiology of the GI tract was reviewed with her. The pathophysiology of perforated viscus was discussed as well. She has already been recommended and planned for exlap; possible colectomy with colostomy; if source actually determined to be small bowel, probable small bowel resection. -We reviewed what a colostomy is and what this would entail; reversibility based upon a number of factors including nutrition, tissue quality, recovery, etc among others. -The planned potential procedures, material risks (including, but not limited to, pain, bleeding, infection, scarring, need for blood transfusion, damage to surrounding structures- blood vessels/nerves/viscus/organs, damage to ureter, urine leak, leak from anastomosis if created, need for additional procedures, need for stoma which may be permanent, worsening of pre-existing medical conditions, hernia, recurrence, pneumonia, heart attack, stroke, death) benefits and alternatives to surgery were discussed at length. I noted a good probability that the procedure would help improve their symptoms. The patient's  questions were answered to her satisfaction, she voiced understanding and elected to proceed with surgery. Additionally, we discussed typical postoperative expectations and the recovery process.  -Her husband Carlis Abbott is whom she would like me to update when we are finished  Nadeen Landau, MD [facility] Use [email or web address] to contact on call provider

## 2020-06-20 NOTE — Transfer of Care (Signed)
Immediate Anesthesia Transfer of Care Note  Patient: [name]  Procedure(s) Performed: EXPLORATORY LAPAROTOMY, sigmoidectomy, takedown splenic flexure, end descending colostomy placement. (N/A Abdomen)  Patient Location: PACU  Anesthesia Type:General  Level of Consciousness: awake and sedated  Airway & Oxygen Therapy: Patient Spontanous Breathing  Post-op Assessment: Report given to RN and Post -op Vital signs reviewed and stable  Post vital signs: Reviewed and stable  Last Vitals:  Vitals Value Taken Time  BP 102/67 06/20/20 2107  Temp    Pulse 71 06/20/20 2108  Resp 24 06/20/20 2111  SpO2 94 % 06/20/20 2108  Vitals shown include unvalidated device data.  Last Pain:  Vitals:   06/20/20 1319  TempSrc:   PainSc: 6          Complications: No complications documented.

## 2020-06-20 NOTE — Anesthesia Preprocedure Evaluation (Addendum)
Anesthesia Evaluation  Patient identified by MRN, date of birth, ID band Patient awake    Reviewed: Allergy & Precautions, NPO status , Patient's Chart, lab work & pertinent test results  History of Anesthesia Complications (+) PONV  Airway Mallampati: II  TM Distance: >3 FB Neck ROM: Full    Dental  (+) Teeth Intact   Pulmonary neg pulmonary ROS, former smoker,    Pulmonary exam normal        Cardiovascular hypertension, Pt. on medications  Rhythm:Regular Rate:Normal     Neuro/Psych negative neurological ROS  negative psych ROS   GI/Hepatic Neg liver ROS, Pneumoperitoneum with perforated viscus    Endo/Other  negative endocrine ROS  Renal/GU negative Renal ROS  negative genitourinary   Musculoskeletal  (+) Arthritis , Fibromyalgia -  Abdominal Normal abdominal exam  (+)   Bowel sounds: normal.  Peds  Hematology  (+) anemia ,   Anesthesia Other Findings   Reproductive/Obstetrics                            Anesthesia Physical Anesthesia Plan  ASA: II  Anesthesia Plan: General   Post-op Pain Management:    Induction: Intravenous and Rapid sequence  PONV Risk Score and Plan: 4 or greater and Ondansetron, Dexamethasone, Midazolam and Treatment may vary due to age or medical condition  Airway Management Planned: Mask and Oral ETT  Additional Equipment: None  Intra-op Plan:   Post-operative Plan: Extubation in OR  Informed Consent: I have reviewed the patients History and Physical, chart, labs and discussed the procedure including the risks, benefits and alternatives for the proposed anesthesia with the patient or authorized representative who has indicated his/her understanding and acceptance.     Dental advisory given  Plan Discussed with: CRNA  Anesthesia Plan Comments: (Lab Results      Component                Value               Date                      WBC                       14.2 (H)            06/20/2020                HGB                      12.3                06/20/2020                HCT                      39.1                06/20/2020                MCV                      87.9                06/20/2020                PLT  274                 06/20/2020           Lab Results      Component                Value               Date                      NA                       136                 06/20/2020                K                        3.5                 06/20/2020                CO2                      24                  06/20/2020                GLUCOSE                  106 (H)             06/20/2020                BUN                      14                  06/20/2020                CREATININE               1.01 (H)            06/20/2020                CALCIUM                  9.2                 06/20/2020                GFRNONAA                 >60                 06/20/2020                GFRAA                    >60                 05/24/2018          )       Anesthesia Quick Evaluation

## 2020-06-21 LAB — CBC
HCT: 32.6 % — ABNORMAL LOW (ref 36.0–46.0)
Hemoglobin: 10.2 g/dL — ABNORMAL LOW (ref 12.0–15.0)
MCH: 27.9 pg (ref 26.0–34.0)
MCHC: 31.3 g/dL (ref 30.0–36.0)
MCV: 89.1 fL (ref 80.0–100.0)
Platelets: 219 10*3/uL (ref 150–400)
RBC: 3.66 MIL/uL — ABNORMAL LOW (ref 3.87–5.11)
RDW: 14.4 % (ref 11.5–15.5)
WBC: 5.7 10*3/uL (ref 4.0–10.5)
nRBC: 0 % (ref 0.0–0.2)

## 2020-06-21 LAB — BASIC METABOLIC PANEL
Anion gap: 10 (ref 5–15)
BUN: 10 mg/dL (ref 8–23)
CO2: 18 mmol/L — ABNORMAL LOW (ref 22–32)
Calcium: 7.7 mg/dL — ABNORMAL LOW (ref 8.9–10.3)
Chloride: 108 mmol/L (ref 98–111)
Creatinine, Ser: 0.93 mg/dL (ref 0.44–1.00)
GFR, Estimated: >60 mL/min (ref >60)
Glucose, Bld: 127 mg/dL — ABNORMAL HIGH (ref 70–99)
Potassium: 3.2 mmol/L — ABNORMAL LOW (ref 3.5–5.1)
Sodium: 136 mmol/L (ref 135–145)

## 2020-06-21 LAB — HIV ANTIBODY (ROUTINE TESTING W REFLEX): HIV Screen 4th Generation wRfx: NONREACTIVE

## 2020-06-21 LAB — MAGNESIUM: Magnesium: 1.7 mg/dL (ref 1.7–2.4)

## 2020-06-21 MED ORDER — ACETAMINOPHEN 10 MG/ML IV SOLN
1000.0000 mg | Freq: Four times a day (QID) | INTRAVENOUS | Status: AC
  Administered 2020-06-21: 1000 mg via INTRAVENOUS
  Filled 2020-06-21 (×3): qty 100

## 2020-06-21 MED ORDER — POTASSIUM CHLORIDE 10 MEQ/100ML IV SOLN
10.0000 meq | INTRAVENOUS | Status: AC
  Administered 2020-06-21 (×4): 10 meq via INTRAVENOUS
  Filled 2020-06-21 (×4): qty 100

## 2020-06-21 MED ORDER — PIPERACILLIN-TAZOBACTAM 3.375 G IVPB
3.3750 g | Freq: Three times a day (TID) | INTRAVENOUS | Status: DC
  Administered 2020-06-21 – 2020-06-25 (×12): 3.375 g via INTRAVENOUS
  Filled 2020-06-21 (×12): qty 50

## 2020-06-21 MED ORDER — MAGNESIUM SULFATE 50 % IJ SOLN
3.0000 g | Freq: Once | INTRAVENOUS | Status: AC
  Administered 2020-06-21: 3 g via INTRAVENOUS
  Filled 2020-06-21: qty 6

## 2020-06-21 MED ORDER — PHENOL 1.4 % MT LIQD
1.0000 | OROMUCOSAL | Status: DC | PRN
  Filled 2020-06-21: qty 177

## 2020-06-21 MED ORDER — KETOROLAC TROMETHAMINE 15 MG/ML IJ SOLN
15.0000 mg | Freq: Four times a day (QID) | INTRAMUSCULAR | Status: DC | PRN
  Filled 2020-06-21: qty 1

## 2020-06-21 MED ORDER — METHOCARBAMOL 1000 MG/10ML IJ SOLN
500.0000 mg | Freq: Four times a day (QID) | INTRAVENOUS | Status: DC | PRN
  Filled 2020-06-21: qty 5

## 2020-06-21 MED ORDER — HYDROMORPHONE HCL 1 MG/ML IJ SOLN: 0.5000 mg | INTRAMUSCULAR | Status: DC | PRN

## 2020-06-21 NOTE — Plan of Care (Signed)

## 2020-06-21 NOTE — Progress Notes (Signed)
[facility] Progress Note  1 Day Post-Op  Subjective: CC-  NG tube uncomfortable. Otherwise pain fairly well controlled. Has not gotten OOB yet since surgery. Colostomy with no output.  Objective: Vital signs in last 24 hours: Temp:  [97.5 F (36.4 C)-99 F (37.2 C)] 98.4 F (36.9 C) (04/23 0955) Pulse Rate:  [92-118] 92 (04/23 0955) Resp:  [15-30] 18 (04/23 0955) BP: (87-126)/(60-109) 102/76 (04/23 0955) SpO2:  [93 %-100 %] 97 % (04/23 0955)    Intake/Output from previous day: 04/22 0701 - 04/23 0700 In: 1960 [I.V.:1300; IV Piggyback:660] Out: 850 [Urine:825; Blood:25] Intake/Output this shift: No intake/output data recorded.  PE: Gen:  Alert, NAD, pleasant HEENT: EOM's intact, pupils equal and round Card:  RRR Pulm:  CTAB, no W/R/R, rate and effort normal Abd: Soft, minimal distension, hypoactive BS, appropriately tender, midline incision mostly open with beefy red tissue and no erythema or purulent drainage/ few staples present, ostomy viable with no air or stool in pouch  Lab Results:  Recent Labs    06/20/20 0947 06/21/20 0113  WBC 14.2* 5.7  HGB 12.3 10.2*  HCT 39.1 32.6*  PLT 274 219   BMET Recent Labs    06/20/20 0947 06/21/20 0113  NA 136 136  K 3.5 3.2*  CL 101 108  CO2 24 18*  GLUCOSE 106* 127*  BUN 14 10  CREATININE 1.01* 0.93  CALCIUM 9.2 7.7*   PT/INR No results for input(s): LABPROT, INR in the last 72 hours. CMP     Component Value Date/Time   NA 136 06/21/2020 0113   K 3.2 (L) 06/21/2020 0113   CL 108 06/21/2020 0113   CO2 18 (L) 06/21/2020 0113   GLUCOSE 127 (H) 06/21/2020 0113   BUN 10 06/21/2020 0113   CREATININE 0.93 06/21/2020 0113   CREATININE 0.83 04/26/2018 1227   CALCIUM 7.7 (L) 06/21/2020 0113   PROT 6.9 06/20/2020 0947   ALBUMIN 3.3 (L) 06/20/2020 0947   AST 17 06/20/2020 0947   AST 17 04/26/2018 1227   ALT 14 06/20/2020 0947   ALT 13 04/26/2018 1227   ALKPHOS 102 06/20/2020 0947   BILITOT 1.3 (H)  06/20/2020 0947   BILITOT 0.5 04/26/2018 1227   GFRNONAA >60 06/21/2020 0113   GFRNONAA >60 04/26/2018 1227   GFRAA >60 05/24/2018 0157   GFRAA >60 04/26/2018 1227   Lipase     Component Value Date/Time   LIPASE 21 06/20/2020 0947       Studies/Results: CT Angio Abd/Pel W and/or Wo Contrast  Result Date: 06/20/2020 CLINICAL DATA:  Lower abdominal pain, diarrhea for 3 days EXAM: CT ANGIOGRAPHY ABDOMEN AND PELVIS WITH CONTRAST AND WITHOUT CONTRAST TECHNIQUE: Multidetector CT imaging of the abdomen and pelvis was performed using the standard protocol during bolus administration of intravenous contrast. Multiplanar reconstructed images and MIPs were obtained and reviewed to evaluate the vascular anatomy. CONTRAST:  174mL OMNIPAQUE IOHEXOL 350 MG/ML SOLN COMPARISON:  None. FINDINGS: VASCULAR Aorta: Intact aorta. Very minimal atherosclerotic change. No aneurysm or dissection. No occlusive process. negative for rupture or retroperitoneal hemorrhage. No surrounding inflammatory aortic process. Celiac: Remains patent including its branches SMA: Remains patent including its branches Renals: Main renal arteries remain widely patent. No accessory renal artery. IMA: Origin remains patent.  Branches are not well demonstrated. Inflow: Iliac vasculature remains widely patent. The common, internal and external iliac arteries are all patent. No occlusive process or inflow disease. Proximal Outflow: Common femoral, proximal profunda femoral, and proximal superficial femoral arteries are  all patent. Veins: No veno-occlusive process. Review of the MIP images confirms the above findings. NON-VASCULAR Lower chest: Minor dependent basilar hypoventilatory changes. Normal heart size. No pericardial effusion. Hepatobiliary: Anterior hepatic subcapsular cyst measures 3 cm. No biliary dilatation. Hepatic and portal veins are patent. Gallbladder unremarkable. Pancreas: Slight prominence of the pancreatic duct, nonspecific. No  focal pancreas abnormality or surrounding inflammatory process. Spleen: Normal in size without focal abnormality. Adrenals/Urinary Tract: Adrenal glands are unremarkable. Kidneys are normal, without renal calculi, focal lesion, or hydronephrosis. Bladder is unremarkable. Stomach/Bowel: There are numerous small scattered foci of free air throughout the anterior mid abdomen at the level of the umbilicus along the omentum, images 32 through 48 series 8. Small amount of free fluid focally along the left mid abdominal omentum, image 41 series 8. There is left abdominal localized inflammation in the mesenteric fat in the left lower quadrant with adjacent loops of small bowel and colon. 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 By CT, it is difficult to localize the source of perforation but suspect either related to left descending colon diverticulitis versus a left abdominal small bowel source. Scattered small foci of free air also suspected along the small bowel in the pelvis, image 56 series 8. Small amount of dependent free fluid in the pelvis as well. No significant drainable abscess or fluid collection. Appendix is nondilated containing hyperdense material overlying the iliac vessels. Lymphatic: No bulky adenopathy. Reproductive: Remote hysterectomy.  No other adnexal abnormality. Other: Intact abdominal wall.  Negative for hernia. Musculoskeletal: Degenerative changes of the spine and hip joints. IMPRESSION: VASCULAR Intact aorta without evidence of acute vascular finding. Negative for aneurysm or dissection. Patent mesenteric vasculature. Scattered areas of hyperdense material in the colon compatible with ingested material. No evidence of active arterial GI bleeding. NON-VASCULAR Scattered pneumoperitoneum compatible with perforated viscus, location cannot be accurately determined but favored to either be from left descending colon diverticulitis versus a small bowel source in the left mid abdomen. These  results were called by telephone at the time of interpretation on 06/20/2020 at 2:13 pm to provider [facility] , who verbally acknowledged these results. Electronically Signed   By: Jerilynn Mages.  Shick M.D.   On: 06/20/2020 14:15    Anti-infectives: Anti-infectives (From admission, onward)   Start     Dose/Rate Route Frequency Ordered Stop   06/20/20 1900  metroNIDAZOLE (FLAGYL) IVPB 500 mg  Status:  Discontinued        500 mg 100 mL/hr over 60 Minutes Intravenous  Once 06/20/20 1849 06/20/20 2057   06/20/20 1900  cefTRIAXone (ROCEPHIN) injection 1 g  Status:  Discontinued        1 g Intramuscular  Once 06/20/20 1851 06/20/20 2057   06/20/20 1845  cefTRIAXone (ROCEPHIN) injection 1 g  Status:  Discontinued        1 g Intramuscular Every 24 hours 06/20/20 1848 06/20/20 1852   06/20/20 1500  piperacillin-tazobactam (ZOSYN) IVPB 3.375 g  Status:  Discontinued        3.375 g 12.5 mL/hr over 240 Minutes Intravenous Every 8 hours 06/20/20 1457 06/20/20 2240       Assessment/Plan HTN - home med  Perforated diverticulitis at descending/sigmoid junction S/p Exploratory laparotomy with sigmoidectomy, takedown of splenic flexure, partial omentectomy, 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 Dema Severin - POD#1 - surgical path pending - WOC consult for new colostomy - BID wet to dry dressing changes to midline abdominal incision - Continue NPO/NGT and await return in bowel function -  Mobilize - Pain medications adjusted: schedule IV tylenol, prn IV robaxin and toradol in addition to narcotic  ID - zosyn 4/22>> FEN - IVF, NPO/NGT to LIWS. Replete K VTE - sq heparin Foley - d/c 4/23 once mobilizing Follow up - ostomy clinic, Dr. Dema Severin   LOS: 1 day    [place], [facility] 06/21/2020, 9:57 AM Please see Amion for pager number during day hours 7:00am-4:30pm

## 2020-06-22 ENCOUNTER — Encounter (HOSPITAL_COMMUNITY): Payer: Self-pay | Admitting: Surgery

## 2020-06-22 LAB — BASIC METABOLIC PANEL
Anion gap: 8 (ref 5–15)
BUN: 14 mg/dL (ref 8–23)
CO2: 21 mmol/L — ABNORMAL LOW (ref 22–32)
Calcium: 8.4 mg/dL — ABNORMAL LOW (ref 8.9–10.3)
Chloride: 110 mmol/L (ref 98–111)
Creatinine, Ser: 0.9 mg/dL (ref 0.44–1.00)
GFR, Estimated: >60 mL/min (ref >60)
Glucose, Bld: 109 mg/dL — ABNORMAL HIGH (ref 70–99)
Potassium: 3.9 mmol/L (ref 3.5–5.1)
Sodium: 139 mmol/L (ref 135–145)

## 2020-06-22 LAB — CBC
HCT: 30.1 % — ABNORMAL LOW (ref 36.0–46.0)
Hemoglobin: 9.6 g/dL — ABNORMAL LOW (ref 12.0–15.0)
MCH: 27.6 pg (ref 26.0–34.0)
MCHC: 31.9 g/dL (ref 30.0–36.0)
MCV: 86.5 fL (ref 80.0–100.0)
Platelets: 230 10*3/uL (ref 150–400)
RBC: 3.48 MIL/uL — ABNORMAL LOW (ref 3.87–5.11)
RDW: 14.8 % (ref 11.5–15.5)
WBC: 10.8 10*3/uL — ABNORMAL HIGH (ref 4.0–10.5)
nRBC: 0 % (ref 0.0–0.2)

## 2020-06-22 LAB — MAGNESIUM: Magnesium: 2.7 mg/dL — ABNORMAL HIGH (ref 1.7–2.4)

## 2020-06-22 NOTE — Progress Notes (Signed)
2 Days Post-Op   Subjective/Chief Complaint: No complaints Pain controlled NG output no recorded   Objective: Vital signs in last 24 hours: Temp:  [98.4 F (36.9 C)-98.7 F (37.1 C)] 98.6 F (37 C) (04/24 0419) Pulse Rate:  [91-101] 91 (04/24 0419) Resp:  [17-18] 18 (04/24 0419) BP: (102-122)/(73-84) 122/78 (04/24 0419) SpO2:  [96 %-99 %] 98 % (04/24 0419)    Intake/Output from previous day: 04/23 0701 - 04/24 0700 In: 60 [P.O.:60] Out: 300 [Urine:300] Intake/Output this shift: No intake/output data recorded.  Exam: Awake and alert Looks comfortable Breathing non-labored Abdomen soft, ostomy pink, minimal in bag, minimal distension  Lab Results:  Recent Labs    06/21/20 0113 06/22/20 0143  WBC 5.7 10.8*  HGB 10.2* 9.6*  HCT 32.6* 30.1*  PLT 219 230   BMET Recent Labs    06/21/20 0113 06/22/20 0143  NA 136 139  K 3.2* 3.9  CL 108 110  CO2 18* 21*  GLUCOSE 127* 109*  BUN 10 14  CREATININE 0.93 0.90  CALCIUM 7.7* 8.4*   PT/INR No results for input(s): LABPROT, INR in the last 72 hours. ABG No results for input(s): PHART, HCO3 in the last 72 hours.  Invalid input(s): PCO2, PO2  Studies/Results: CT Angio Abd/Pel W and/or Wo Contrast  Result Date: 06/20/2020 CLINICAL DATA:  Lower abdominal pain, diarrhea for 3 days EXAM: CT ANGIOGRAPHY ABDOMEN AND PELVIS WITH CONTRAST AND WITHOUT CONTRAST TECHNIQUE: Multidetector CT imaging of the abdomen and pelvis was performed using the standard protocol during bolus administration of intravenous contrast. Multiplanar reconstructed images and MIPs were obtained and reviewed to evaluate the vascular anatomy. CONTRAST:  172mL OMNIPAQUE IOHEXOL 350 MG/ML SOLN COMPARISON:  None. FINDINGS: VASCULAR Aorta: Intact aorta. Very minimal atherosclerotic change. No aneurysm or dissection. No occlusive process. negative for rupture or retroperitoneal hemorrhage. No surrounding inflammatory aortic process. Celiac: Remains patent  including its branches SMA: Remains patent including its branches Renals: Main renal arteries remain widely patent. No accessory renal artery. IMA: Origin remains patent.  Branches are not well demonstrated. Inflow: Iliac vasculature remains widely patent. The common, internal and external iliac arteries are all patent. No occlusive process or inflow disease. Proximal Outflow: Common femoral, proximal profunda femoral, and proximal superficial femoral arteries are all patent. Veins: No veno-occlusive process. Review of the MIP images confirms the above findings. NON-VASCULAR Lower chest: Minor dependent basilar hypoventilatory changes. Normal heart size. No pericardial effusion. Hepatobiliary: Anterior hepatic subcapsular cyst measures 3 cm. No biliary dilatation. Hepatic and portal veins are patent. Gallbladder unremarkable. Pancreas: Slight prominence of the pancreatic duct, nonspecific. No focal pancreas abnormality or surrounding inflammatory process. Spleen: Normal in size without focal abnormality. Adrenals/Urinary Tract: Adrenal glands are unremarkable. Kidneys are normal, without renal calculi, focal lesion, or hydronephrosis. Bladder is unremarkable. Stomach/Bowel: There are numerous small scattered foci of free air throughout the anterior mid abdomen at the level of the umbilicus along the omentum, images 32 through 48 series 8. Small amount of free fluid focally along the left mid abdominal omentum, image 41 series 8. There is left abdominal localized inflammation in the mesenteric fat in the left lower quadrant with adjacent loops of small bowel and colon. 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 By CT, it is difficult to localize the source of perforation but suspect either related to left descending colon diverticulitis versus a left abdominal small bowel source. Scattered small foci of free air also suspected along the small bowel in the pelvis, image 56 series  8. Small amount of dependent  free fluid in the pelvis as well. No significant drainable abscess or fluid collection. Appendix is nondilated containing hyperdense material overlying the iliac vessels. Lymphatic: No bulky adenopathy. Reproductive: Remote hysterectomy.  No other adnexal abnormality. Other: Intact abdominal wall.  Negative for hernia. Musculoskeletal: Degenerative changes of the spine and hip joints. IMPRESSION: VASCULAR Intact aorta without evidence of acute vascular finding. Negative for aneurysm or dissection. Patent mesenteric vasculature. Scattered areas of hyperdense material in the colon compatible with ingested material. No evidence of active arterial GI bleeding. NON-VASCULAR Scattered pneumoperitoneum compatible with perforated viscus, location cannot be accurately determined but favored to either be from left descending colon diverticulitis versus a small bowel source in the left mid abdomen. These results were called by telephone at the time of interpretation on 06/20/2020 at 2:13 pm to provider [facility] , who verbally acknowledged these results. Electronically Signed   By: Jerilynn Mages.  Shick M.D.   On: 06/20/2020 14:15    Anti-infectives: Anti-infectives (From admission, onward)   Start     Dose/Rate Route Frequency Ordered Stop   06/21/20 1000  piperacillin-tazobactam (ZOSYN) IVPB 3.375 g        3.375 g 12.5 mL/hr over 240 Minutes Intravenous Every 8 hours 06/21/20 1002     06/20/20 1900  metroNIDAZOLE (FLAGYL) IVPB 500 mg  Status:  Discontinued        500 mg 100 mL/hr over 60 Minutes Intravenous  Once 06/20/20 1849 06/20/20 2057   06/20/20 1900  cefTRIAXone (ROCEPHIN) injection 1 g  Status:  Discontinued        1 g Intramuscular  Once 06/20/20 1851 06/20/20 2057   06/20/20 1845  cefTRIAXone (ROCEPHIN) injection 1 g  Status:  Discontinued        1 g Intramuscular Every 24 hours 06/20/20 1848 06/20/20 1852   06/20/20 1500  piperacillin-tazobactam (ZOSYN) IVPB 3.375 g  Status:  Discontinued        3.375  g 12.5 mL/hr over 240 Minutes Intravenous Every 8 hours 06/20/20 1457 06/20/20 2240      Assessment/Plan: HTN - home med  Perforated diverticulitis at descending/sigmoid junction S/p Exploratory laparotomy with sigmoidectomy, takedown of splenic flexure, partial omentectomy, 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 Dema Severin  POD#2  Will clamp NG.  D/C if no nausea after 8 hours Continue antibiotics Wound care ambulate   LOS: 2 days    Coralie Keens MD 06/22/2020

## 2020-06-22 NOTE — Anesthesia Postprocedure Evaluation (Signed)
Anesthesia Post Note  Patient: [name]  Procedure(s) Performed: EXPLORATORY LAPAROTOMY, sigmoidectomy, takedown splenic flexure, end descending colostomy placement. (N/A Abdomen)     Patient location during evaluation: PACU Anesthesia Type: General Level of consciousness: awake and alert Pain management: pain level controlled Vital Signs Assessment: post-procedure vital signs reviewed and stable Respiratory status: spontaneous breathing, nonlabored ventilation, respiratory function stable and patient connected to nasal cannula oxygen Cardiovascular status: blood pressure returned to baseline and stable Postop Assessment: no apparent nausea or vomiting Anesthetic complications: no   No complications documented.  Last Vitals:  Vitals:   06/22/20 1630 06/22/20 2033  BP: 136/88 132/83  Pulse: 97 98  Resp: 18 16  Temp: 36.7 C 36.9 C  SpO2: 98% 99%    Last Pain:  Vitals:   06/22/20 2033  TempSrc: Oral  PainSc:                  March Rummage Ceasar Decandia

## 2020-06-22 NOTE — Progress Notes (Signed)
NG clamped at 1045.

## 2020-06-22 NOTE — Plan of Care (Signed)

## 2020-06-23 LAB — BASIC METABOLIC PANEL
Anion gap: 8 (ref 5–15)
BUN: 9 mg/dL (ref 8–23)
CO2: 23 mmol/L (ref 22–32)
Calcium: 8 mg/dL — ABNORMAL LOW (ref 8.9–10.3)
Chloride: 106 mmol/L (ref 98–111)
Creatinine, Ser: 0.82 mg/dL (ref 0.44–1.00)
GFR, Estimated: >60 mL/min (ref >60)
Glucose, Bld: 71 mg/dL (ref 70–99)
Potassium: 3.2 mmol/L — ABNORMAL LOW (ref 3.5–5.1)
Sodium: 137 mmol/L (ref 135–145)

## 2020-06-23 LAB — CBC
HCT: 27.2 % — ABNORMAL LOW (ref 36.0–46.0)
Hemoglobin: 8.8 g/dL — ABNORMAL LOW (ref 12.0–15.0)
MCH: 27.8 pg (ref 26.0–34.0)
MCHC: 32.4 g/dL (ref 30.0–36.0)
MCV: 85.8 fL (ref 80.0–100.0)
Platelets: 236 10*3/uL (ref 150–400)
RBC: 3.17 MIL/uL — ABNORMAL LOW (ref 3.87–5.11)
RDW: 14.7 % (ref 11.5–15.5)
WBC: 11.8 10*3/uL — ABNORMAL HIGH (ref 4.0–10.5)
nRBC: 0 % (ref 0.0–0.2)

## 2020-06-23 LAB — MAGNESIUM: Magnesium: 1.7 mg/dL (ref 1.7–2.4)

## 2020-06-23 MED ORDER — OXYCODONE HCL 5 MG PO TABS: 5.0000 mg | ORAL_TABLET | ORAL | Status: DC | PRN

## 2020-06-23 MED ORDER — HYDROMORPHONE HCL 1 MG/ML IJ SOLN: 0.5000 mg | INTRAMUSCULAR | Status: DC | PRN

## 2020-06-23 MED ORDER — POTASSIUM CHLORIDE CRYS ER 20 MEQ PO TBCR
40.0000 meq | EXTENDED_RELEASE_TABLET | Freq: Two times a day (BID) | ORAL | Status: AC
  Administered 2020-06-23 (×2): 40 meq via ORAL
  Filled 2020-06-23 (×2): qty 2

## 2020-06-23 MED ORDER — ACETAMINOPHEN 325 MG PO TABS: 650.0000 mg | ORAL_TABLET | Freq: Four times a day (QID) | ORAL | Status: DC | PRN

## 2020-06-23 MED ORDER — BOOST / RESOURCE BREEZE PO LIQD CUSTOM
1.0000 | Freq: Three times a day (TID) | ORAL | Status: DC
  Administered 2020-06-23 – 2020-06-24 (×5): 1 via ORAL

## 2020-06-23 MED ORDER — METHOCARBAMOL 500 MG PO TABS
500.0000 mg | ORAL_TABLET | Freq: Four times a day (QID) | ORAL | Status: DC | PRN
Start: 2020-06-23 — End: 2020-06-25

## 2020-06-23 NOTE — Consult Note (Signed)
[name] Nurse ostomy follow up Patient receiving care in [place]. 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 Stomal assessment/size: slightly oval 1 1/4 inches, moist, pink, sutures intact Peristomal assessment: intact. Existing 2 pc ouch undermined with effluent Treatment options for stomal/peristomal skin: barrier ring and one pc flexible placed today.  Output: green effluent  Ostomy pouching: Requested the following:Keep the following ostomy supplies at the bedside: Roselee Culver #[id number]; barrier rings, Kellie Simmering [phone]; belt, Kellie Simmering [phone]  Education provided: entire pouch change process and sizing of new pouch demonstrated to patient. She is able to open/close pouch. Enrolled patient in [place] Start Discharge program: No Val Riles, RN, MSN, [facility], CNS-BC, pager [phone]

## 2020-06-23 NOTE — Progress Notes (Signed)
[facility] Progress Note  3 Days Post-Op  Subjective: CC-  No complaints. Abdomen sore but not requiring any pain medication. Denies n/v since NG tube removal. Taking in ice chips. Thin green fluid from ostomy.  Objective: Vital signs in last 24 hours: Temp:  [98 F (36.7 C)-98.4 F (36.9 C)] 98.3 F (36.8 C) (04/25 0506) Pulse Rate:  [92-98] 92 (04/25 0506) Resp:  [16-18] 17 (04/25 0506) BP: (118-136)/(78-88) 118/78 (04/25 0506) SpO2:  [98 %-99 %] 98 % (04/25 0506) Last BM Date: 06/23/20  Intake/Output from previous day: 04/24 0701 - 04/25 0700 In: 1463.8 [P.O.:120; I.V.:1272.8; IV Piggyback:71] Out: 150 [Stool:150] Intake/Output this shift: Total I/O In: 0  Out: 200 [Urine:200]  PE: Gen:  Alert, NAD, pleasant HEENT: EOM's intact, pupils equal and round Pulm:  rate and effort normal Abd: Soft, minimal distension, nontender, midline incision mostly open with light pink tissue and no erythema or purulent drainage/ few staples present, ostomy viable with air and thin green fluid in pouch  Lab Results:  Recent Labs    06/21/20 0113 06/22/20 0143  WBC 5.7 10.8*  HGB 10.2* 9.6*  HCT 32.6* 30.1*  PLT 219 230   BMET Recent Labs    06/21/20 0113 06/22/20 0143  NA 136 139  K 3.2* 3.9  CL 108 110  CO2 18* 21*  GLUCOSE 127* 109*  BUN 10 14  CREATININE 0.93 0.90  CALCIUM 7.7* 8.4*   PT/INR No results for input(s): LABPROT, INR in the last 72 hours. CMP     Component Value Date/Time   NA 139 06/22/2020 0143   K 3.9 06/22/2020 0143   CL 110 06/22/2020 0143   CO2 21 (L) 06/22/2020 0143   GLUCOSE 109 (H) 06/22/2020 0143   BUN 14 06/22/2020 0143   CREATININE 0.90 06/22/2020 0143   CREATININE 0.83 04/26/2018 1227   CALCIUM 8.4 (L) 06/22/2020 0143   PROT 6.9 06/20/2020 0947   ALBUMIN 3.3 (L) 06/20/2020 0947   AST 17 06/20/2020 0947   AST 17 04/26/2018 1227   ALT 14 06/20/2020 0947   ALT 13 04/26/2018 1227   ALKPHOS 102 06/20/2020 0947    BILITOT 1.3 (H) 06/20/2020 0947   BILITOT 0.5 04/26/2018 1227   GFRNONAA >60 06/22/2020 0143   GFRNONAA >60 04/26/2018 1227   GFRAA >60 05/24/2018 0157   GFRAA >60 04/26/2018 1227   Lipase     Component Value Date/Time   LIPASE 21 06/20/2020 0947       Studies/Results: No results found.  Anti-infectives: Anti-infectives (From admission, onward)   Start     Dose/Rate Route Frequency Ordered Stop   06/21/20 1000  piperacillin-tazobactam (ZOSYN) IVPB 3.375 g        3.375 g 12.5 mL/hr over 240 Minutes Intravenous Every 8 hours 06/21/20 1002     06/20/20 1900  metroNIDAZOLE (FLAGYL) IVPB 500 mg  Status:  Discontinued        500 mg 100 mL/hr over 60 Minutes Intravenous  Once 06/20/20 1849 06/20/20 2057   06/20/20 1900  cefTRIAXone (ROCEPHIN) injection 1 g  Status:  Discontinued        1 g Intramuscular  Once 06/20/20 1851 06/20/20 2057   06/20/20 1845  cefTRIAXone (ROCEPHIN) injection 1 g  Status:  Discontinued        1 g Intramuscular Every 24 hours 06/20/20 1848 06/20/20 1852   06/20/20 1500  piperacillin-tazobactam (ZOSYN) IVPB 3.375 g  Status:  Discontinued  3.375 g 12.5 mL/hr over 240 Minutes Intravenous Every 8 hours 06/20/20 1457 06/20/20 2240       Assessment/Plan HTN - home med  Perforated diverticulitis at descending/sigmoid junction S/p Exploratory laparotomy with sigmoidectomy, takedown of splenic flexure, partial omentectomy, 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 Dema Severin - POD#2 - surgical path pending - WOC consult for new colostomy - BID wet to dry dressing changes to midline abdominal incision - Labs pending. Advance to CLD, and to FLD later today if tolerating. Add oral pain medications. Continue mobilizing. Possibly home in 1-2 days.  ID - zosyn 4/22>>day#3 FEN - decrease IVF, CLD>>FLD, Boost VTE - sq heparin Foley - d/c 4/23 Follow up - ostomy clinic, Dr. Dema Severin   LOS: 3 days    Wellington Hampshire, [facility]  Surgery 06/23/2020, 8:36 AM Please see Amion for pager number during day hours 7:00am-4:30pm

## 2020-06-23 NOTE — Discharge Instructions (Signed)
Wet to Dry WOUND CARE: - Change dressing twice daily - Supplies: sterile saline, kerlex, scissors, ABD pads, tape  1. Remove dressing and all packing carefully, moistening with sterile saline as needed to avoid packing/internal dressing sticking to the wound. 2.   Clean edges of skin around the wound with water/gauze, making sure there is no tape debris or leakage left on skin that could cause skin irritation or breakdown. 3.   Dampen and clean kerlex with sterile saline and pack wound from wound base to skin level, making sure to take note of any possible areas of wound tracking, tunneling and packing appropriately. Wound can be packed loosely. Trim kerlex to size if a whole kerlex is not required. 4.   Cover wound with a dry ABD pad and secure with tape.  5.   Write the date/time on the dry dressing/tape to better track when the last dressing change occurred. - apply any skin protectant/powder if recommended by clinician to protect skin/skin folds. - change dressing as needed if leakage occurs, wound gets contaminated, or patient requests to shower. - You may shower daily with wound open and following the shower the wound should be dried and a clean dressing placed.  - Medical grade tape as well as packing supplies can be found at [employer] on [address] or [employer] on [address]. The remaining supplies can be found at your local drug store, [employer] etc.  [facility], [place] [phone]  OPEN ABDOMINAL SURGERY: POST OP INSTRUCTIONS  Always review your discharge instruction sheet given to you by the facility where your surgery was performed.  IF YOU HAVE DISABILITY OR FAMILY LEAVE FORMS, YOU MUST BRING THEM TO THE OFFICE FOR PROCESSING.  PLEASE DO NOT GIVE THEM TO YOUR DOCTOR.  1. A prescription for pain medication may be given to you upon discharge.  Take your pain medication as prescribed, if needed.  If narcotic pain medicine is not  needed, then you may take acetaminophen (Tylenol) or ibuprofen (Advil) as needed. 2. Take your usually prescribed medications unless otherwise directed. 3. If you need a refill on your pain medication, please contact your pharmacy. They will contact our office to request authorization.  Prescriptions will not be filled after 5pm or on week-ends. 4. You should follow a light diet the first few days after arrival home, such as soup and crackers, pudding, etc.unless your doctor has advised otherwise. A high-fiber, low fat diet can be resumed as tolerated.   Be sure to include lots of fluids daily. Most patients will experience some swelling and bruising on the chest and neck area.  Ice packs will help.  Swelling and bruising can take several days to resolve 5. Most patients will experience some swelling and bruising in the area of the incision. Ice pack will help. Swelling and bruising can take several days to resolve..  6. It is common to experience some constipation if taking pain medication after surgery.  Increasing fluid intake and taking a stool softener will usually help or prevent this problem from occurring.  A mild laxative (Milk of Magnesia or Miralax) should be taken according to package directions if there are no bowel movements after 48 hours. 7.  ACTIVITIES:  You may resume regular (light) daily activities beginning the next day--such as daily self-care, walking, climbing stairs--gradually increasing activities as tolerated.  You may have sexual intercourse when it is comfortable.  Refrain from any heavy lifting or straining  until approved by your doctor. a. You may drive when you no longer are taking prescription pain medication, you can comfortably wear a seatbelt, and you can safely maneuver your car and apply brakes 8. You should see your doctor in the office for a follow-up appointment approximately two weeks after your surgery.  Make sure that you call for this appointment within a day or  two after you arrive home to insure a convenient appointment time.  WHEN TO CALL YOUR DOCTOR: 1. Fever over 101.0 2. Inability to urinate 3. Nausea and/or vomiting 4. Extreme swelling or bruising 5. Continued bleeding from incision. 6. Increased pain, redness, or drainage from the incision. 7. Difficulty swallowing or breathing 8. Muscle cramping or spasms. 9. Numbness or tingling in hands or feet or around lips.  The clinic staff is available to answer your questions during regular business hours.  Please don't hesitate to call and ask to speak to one of the nurses if you have concerns.  For further questions, please visit [email or web address]    Colostomy Home Guide, Adult  Colostomy surgery is done to create an opening in the front of the abdomen for stool (feces) to leave the body through an ostomy (stoma). Part of the large intestine is attached to the stoma. A bag, also called a pouch, is fitted over the stoma. Stool and gas will collect in the bag. After surgery, you will need to empty and change your colostomy bag as needed. You will also need to care for your stoma. How to care for the stoma Your stoma should look pink, red, and moist, like the inside of your cheek. Soon after surgery, the stoma may be swollen, but this swelling will go away within 6 weeks. To care for the stoma:  Keep the skin around the stoma clean and dry.  Use a clean, soft washcloth to gently wash the stoma and the skin around it. Clean using a circular motion, and wipe away from the stoma opening, not toward it. ? Use warm water and only use cleansers recommended by your health care provider. ? Rinse the stoma area with plain water. ? Dry the area around the stoma well.  Use stoma powder or ointment on your skin only as told by your health care provider. Do not use any other powders, gels, wipes, or creams on the skin around the stoma.  Check the stoma area every day for signs of infection.  Check for: ? New or worsening redness, swelling, or pain. ? New or increased fluid or blood. ? Pus or warmth.  Measure the stoma opening regularly and record the size. Watch for changes. (It is normal for the stoma to get smaller as swelling goes away.) Share this information with your health care provider. How to empty the colostomy bag Empty your bag at bedtime and whenever it is one-third to one-half full. Do not let the bag get more than half-full with stool or gas. The bag could leak if it gets too full. Some colostomy bags have a built-in gas release valve that releases gas often throughout the day. Follow these basic steps: 1. Wash your hands with soap and water. 2. Sit far back on the toilet seat. 3. Put several pieces of toilet paper into the toilet water. This will prevent splashing as you empty stool into the toilet. 4. Remove the clip or the hook-and-loop fastener from the tail end of the bag. 5. Unroll the tail, then empty the stool into the toilet. 6.  Clean the tail with toilet paper or a moist towelette. 7. Reroll the tail, and close it with the clip or the hook-and-loop fastener. 8. Wash your hands again.   How to change the colostomy bag Change your bag every 3-4 days or as often as told by your health care provider. Also change the bag if it is leaking or separating from the skin, or if your skin around the stoma looks or feels irritated. Irritated skin may be a sign that the bag is leaking. Always have colostomy supplies with you, and follow these basic steps: 1. Wash your hands with soap and water. Have paper towels or tissues nearby to clean any discharge. 2. Remove the old bag and skin barrier. Use your fingers or a warm cloth to gently push the skin away from the barrier. 3. Clean the stoma area with water or with mild soap and water, as directed. Use water to rinse away any soap. 4. Dry the skin. You may use the cool setting on a hair dryer to do this. 5. Use a tracing  pattern (template) to cut the skin barrier to the size needed. 6. If you are using a two-piece bag, attach the bag and the skin barrier to each other. Add the barrier ring, if you use one. 7. If directed, apply stoma powder or skin barrier gel to the skin. 8. Warm the skin barrier with your hands, or blow with a hair dryer for 5-10 seconds. 9. Remove the paper from the adhesive strip of the skin barrier. 10. Press the adhesive strip onto the skin around the stoma. 11. Gently rub the skin barrier onto the skin. This creates heat that helps the barrier to stick. 12. Apply stoma tape to the edges of the skin barrier, if desired. 22. Wash your hands again. General recommendations  Avoid wearing tight clothes or having anything press directly on your stoma or bag. Change your clothing whenever it is soiled or damp.  You may shower or bathe with the bag on or off. Do not use harsh or oily soaps or lotions. Dry the skin and bag after bathing.  Store all supplies in a cool, dry place. Do not leave supplies in extreme heat because some parts can melt or not stick as well.  Whenever you leave home, take extra clothing and an extra skin barrier and bag with you.  If your bag gets wet, you can dry it with a hair dryer on the cool setting.  To prevent odor, you may put drops of ostomy deodorizer in the bag.  If recommended by your health care provider, put ostomy lubricant inside the bag. This helps stool to slide out of the bag more easily and completely. Contact a health care provider if:  You have new or worsening redness, swelling, or pain around your stoma.  You have new or increased fluid or blood coming from your stoma.  Your stoma feels warm to the touch.  You have pus coming from your stoma.  Your stoma extends in or out farther than normal.  You need to change your bag every day.  You have a fever. Get help right away if:  Your stool is bloody.  You have nausea or you  vomit.  You have trouble breathing. Summary  Measure your stoma opening regularly and record the size. Watch for changes.  Empty your bag at bedtime and whenever it is one-third to one-half full. Do not let the bag get more than half-full with  stool or gas.  Change your bag every 3-4 days or as often as told by your health care provider.  Whenever you leave home, take extra clothing and an extra skin barrier and bag with you. This information is not intended to replace advice given to you by your health care provider. Make sure you discuss any questions you have with your health care provider. Document Revised: 10/18/2019 Document Reviewed: 10/18/2019 Elsevier Patient Education  2021 [employer].

## 2020-06-23 NOTE — Care Management Important Message (Signed)
Important Message  Patient Details  Name: [name] MRN: [id number] Date of Birth: [birth year]   Medicare Important Message Given:  Yes     Rossanna Spitzley Montine Circle 06/23/2020, 3:34 PM

## 2020-06-23 NOTE — TOC Initial Note (Addendum)
Transition of Care [facility]) - Initial/Assessment Note    Patient Details  Name: [name] MRN: [id number] Date of Birth: 1950-06-24  [facility] of [facility] CM/SW Contact:    Marilu Favre, RN Phone Number: 06/23/2020, 2:05 PM  Clinical Narrative:                  Spoke to patient and husband at bedside. Confirmed face sheet information.   Discussed patient and husband will be taught by [facility] nurse and bedside nurse ostomy care and wet to dry dressing changes prior to discharge. HHRN will not  Be able to make daily dressing changes. Both voiced understanding .   Referral called to [facility] with [facility] , awaiting call back . Tommi Rumps accepted  Expected Discharge Plan: [place]     Patient Goals and CMS Choice Patient states their goals for this hospitalization and ongoing recovery are:: to return to home CMS Medicare.gov Compare Post Acute Care list provided to:: Patient Choice offered to / list presented to : Patient  Expected Discharge Plan and Services Expected Discharge Plan: [place]   Discharge Planning Services: CM Consult Post Acute Care Choice: [place] arrangements for the past 2 months: Single Family Home                 DME Arranged: N/A DME Agency: NA       HH Arranged: RN [place] Agency: [place] Date [place]: 06/23/20 Time [place]: 1402 Representative spoke with at [place]: Tommi Rumps , left message awaiting call back  Prior Living Arrangements/Services Living arrangements for the past 2 months: Single Family Home Lives with:: Spouse Patient language and need for interpreter reviewed:: Yes Do you feel safe going back to the place where you live?: Yes      Need for Family Participation in Patient Care: Yes (Comment) Care giver support system in place?: Yes (comment)   Criminal Activity/Legal Involvement Pertinent to Current Situation/Hospitalization: No - Comment as  needed  Activities of Daily Living      Permission Sought/Granted   Permission granted to share information with : Yes, Verbal Permission Granted  Share Information with NAME: husband Carlis Abbott           Emotional Assessment Appearance:: Appears stated age Attitude/Demeanor/Rapport: Engaged Affect (typically observed): Accepting Orientation: : Oriented to Self,Oriented to Place,Oriented to  Time,Oriented to Situation Alcohol / Substance Use: Not Applicable Psych Involvement: No (comment)  Admission diagnosis:  Perforated abdominal viscus [R19.8] S/P colostomy [facility]) [Z93.3] Patient Active Problem List   Diagnosis Date Noted  . 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 Vitamin D deficiency 07/24/2019  . Neoplasm of right breast, 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  . Biceps tendon rupture 05/10/2018  . Genetic testing 05/08/2018  . Family history of breast cancer   . Family history of ovarian cancer   . 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 Mass of left thigh 07/14/2017  . Hematoma of left thigh 03/18/2015  . Pain in joint, lower leg 04/05/2014  . Hand pain 03/29/2012  . Fibromyalgia 03/29/2012  . Well adult exam 03/17/2011  . HIP PAIN 02/06/2010  . HYPERGLYCEMIA 02/06/2010  . URI 08/08/2009  . ANEMIA-NOS 11/07/2007  . Essential hypertension 11/07/2007  . Osteoarthritis 11/07/2007   PCP:  Cassandria Anger, MD Pharmacy:   [id number] - [place], [place]  [address] [place]  [address] Phone: [phone] Fax: [phone]     Social Determinants of Health (SDOH) Interventions    Readmission Risk Interventions No flowsheet data found.

## 2020-06-24 LAB — BASIC METABOLIC PANEL
Anion gap: 9 (ref 5–15)
BUN: 7 mg/dL — ABNORMAL LOW (ref 8–23)
CO2: 26 mmol/L (ref 22–32)
Calcium: 8.5 mg/dL — ABNORMAL LOW (ref 8.9–10.3)
Chloride: 102 mmol/L (ref 98–111)
Creatinine, Ser: 0.7 mg/dL (ref 0.44–1.00)
GFR, Estimated: >60 mL/min (ref >60)
Glucose, Bld: 150 mg/dL — ABNORMAL HIGH (ref 70–99)
Potassium: 3.9 mmol/L (ref 3.5–5.1)
Sodium: 137 mmol/L (ref 135–145)

## 2020-06-24 LAB — SURGICAL PATHOLOGY

## 2020-06-24 LAB — CBC
HCT: 28 % — ABNORMAL LOW (ref 36.0–46.0)
Hemoglobin: 9.1 g/dL — ABNORMAL LOW (ref 12.0–15.0)
MCH: 27.5 pg (ref 26.0–34.0)
MCHC: 32.5 g/dL (ref 30.0–36.0)
MCV: 84.6 fL (ref 80.0–100.0)
Platelets: 272 10*3/uL (ref 150–400)
RBC: 3.31 MIL/uL — ABNORMAL LOW (ref 3.87–5.11)
RDW: 14.4 % (ref 11.5–15.5)
WBC: 11.7 10*3/uL — ABNORMAL HIGH (ref 4.0–10.5)
nRBC: 0 % (ref 0.0–0.2)

## 2020-06-24 LAB — MAGNESIUM: Magnesium: 1.5 mg/dL — ABNORMAL LOW (ref 1.7–2.4)

## 2020-06-24 MED ORDER — MAGNESIUM SULFATE 2 GM/50ML IV SOLN
2.0000 g | Freq: Once | INTRAVENOUS | Status: AC
  Administered 2020-06-24: 2 g via INTRAVENOUS
  Filled 2020-06-24: qty 50

## 2020-06-24 MED ORDER — MAGNESIUM SULFATE 50 % IJ SOLN
2.0000 g | Freq: Once | INTRAMUSCULAR | Status: DC
  Filled 2020-06-24: qty 4

## 2020-06-24 NOTE — Consult Note (Signed)
[facility] Nurse ostomy follow up Stoma type/location: LUQ, end colostomy Stomal assessment/size: 1 1/4" oval shaped; pink; observed through pouch Peristomal assessment: pouch intact  Treatment options for stomal/peristomal skin: 2" skin barrier ring Output pasty dark brown Ostomy pouching: 1pc.flat with 2" skin barrier in place from yesterday teaching session Education provided:  Patient is independent in emptying when 1/3 to 1/2 full as well as use of wick to clean bottom of the spout. Discussed measuring stoma with patient Allowed patient to cut new skin barrier on 1pc convex and rationale for use Discussed use of belt with patient and demonstrated use Discussed HHRN and the role; patient understands she will need to learn to perform abdominal wound care.  Her daughter will be coming this weekend to assist her in her home.  Her husband will not be able to assist her.  Will follow up with patient tomorrow to allow patient to change pouch.   Enrolled patient in [place] Start Discharge program: Yes Supplies in the room.  [place] Nurse will follow along with you for continued support with ostomy teaching and care [place] MSN, RN, [place], [place], [place]

## 2020-06-24 NOTE — Progress Notes (Signed)
[facility] Progress Note  4 Days Post-Op  Subjective: CC-  WOC team at bedside for colostomy education.  Patient feeling well. Abdomen a little sore but not requiring any pain medication. Tolerating full liquids. Colostomy productive. Denies n/v.   Objective: Vital signs in last 24 hours: Temp:  [98.2 F (36.8 C)-98.9 F (37.2 C)] 98.2 F (36.8 C) (04/26 0400) Pulse Rate:  [97-103] 97 (04/26 0400) Resp:  [17-20] 17 (04/26 0400) BP: (119-137)/(76-87) 119/82 (04/26 0400) SpO2:  [96 %-100 %] 96 % (04/26 0400) Last BM Date: 06/24/20  Intake/Output from previous day: 04/25 0701 - 04/26 0700 In: 540 [P.O.:540] Out: 475 [Urine:200; Stool:275] Intake/Output this shift: No intake/output data recorded.  PE: Gen: Alert, NAD, pleasant HEENT: EOM's intact, pupils equal and round Pulm: rate and effort normal Abd: Soft,nondistended,nontender, midline incision with cdi dressing in place (wound evaluated yesterday and pink/healthy without signs of infection, spoke with nurse who changed dressing today and she states it is stable from yesterday), ostomy viable with air and thin liquid stool in pouch   Lab Results:  Recent Labs    06/23/20 0714 06/24/20 0257  WBC 11.8* 11.7*  HGB 8.8* 9.1*  HCT 27.2* 28.0*  PLT 236 272   BMET Recent Labs    06/23/20 0714 06/24/20 0257  NA 137 137  K 3.2* 3.9  CL 106 102  CO2 23 26  GLUCOSE 71 150*  BUN 9 7*  CREATININE 0.82 0.70  CALCIUM 8.0* 8.5*   PT/INR No results for input(s): LABPROT, INR in the last 72 hours. CMP     Component Value Date/Time   NA 137 06/24/2020 0257   K 3.9 06/24/2020 0257   CL 102 06/24/2020 0257   CO2 26 06/24/2020 0257   GLUCOSE 150 (H) 06/24/2020 0257   BUN 7 (L) 06/24/2020 0257   CREATININE 0.70 06/24/2020 0257   CREATININE 0.83 04/26/2018 1227   CALCIUM 8.5 (L) 06/24/2020 0257   PROT 6.9 06/20/2020 0947   ALBUMIN 3.3 (L) 06/20/2020 0947   AST 17 06/20/2020 0947   AST 17 04/26/2018  1227   ALT 14 06/20/2020 0947   ALT 13 04/26/2018 1227   ALKPHOS 102 06/20/2020 0947   BILITOT 1.3 (H) 06/20/2020 0947   BILITOT 0.5 04/26/2018 1227   GFRNONAA >60 06/24/2020 0257   GFRNONAA >60 04/26/2018 1227   GFRAA >60 05/24/2018 0157   GFRAA >60 04/26/2018 1227   Lipase     Component Value Date/Time   LIPASE 21 06/20/2020 0947       Studies/Results: No results found.  Anti-infectives: Anti-infectives (From admission, onward)   Start     Dose/Rate Route Frequency Ordered Stop   06/21/20 1000  piperacillin-tazobactam (ZOSYN) IVPB 3.375 g        3.375 g 12.5 mL/hr over 240 Minutes Intravenous Every 8 hours 06/21/20 1002     06/20/20 1900  metroNIDAZOLE (FLAGYL) IVPB 500 mg  Status:  Discontinued        500 mg 100 mL/hr over 60 Minutes Intravenous  Once 06/20/20 1849 06/20/20 2057   06/20/20 1900  cefTRIAXone (ROCEPHIN) injection 1 g  Status:  Discontinued        1 g Intramuscular  Once 06/20/20 1851 06/20/20 2057   06/20/20 1845  cefTRIAXone (ROCEPHIN) injection 1 g  Status:  Discontinued        1 g Intramuscular Every 24 hours 06/20/20 1848 06/20/20 1852   06/20/20 1500  piperacillin-tazobactam (ZOSYN) IVPB 3.375 g  Status:  Discontinued        3.375 g 12.5 mL/hr over 240 Minutes Intravenous Every 8 hours 06/20/20 1457 06/20/20 2240       Assessment/Plan HTN- home med  Perforated diverticulitis at descending/sigmoid junction S/pExploratory laparotomy with sigmoidectomy, takedown of splenic flexure, partial omentectomy, creation of end descending colostomy4/22 Dr. Dema Severin -POD#4 - surgical path pending - WOC consult for new colostomy - BID wet to dry dressing changes to midline abdominal incision - Advance to soft diet. Replete magnesium. Continue IV antibiotics and repeat labs in AM. May be ready for discharge tomorrow. Home health orders placed.  ID -zosyn 4/22>>day#4 FEN - d/c IVF, soft diet, Boost VTE -sq heparin Foley -d/c 4/23 Follow up  -ostomy clinic, Dr. Dema Severin   LOS: 4 days    Wellington Hampshire, [facility] 06/24/2020, 9:00 AM Please see Amion for pager number during day hours 7:00am-4:30pm

## 2020-06-25 ENCOUNTER — Inpatient Hospital Stay (HOSPITAL_COMMUNITY): Payer: Medicare Other

## 2020-06-25 DIAGNOSIS — Z9889 Other specified postprocedural states: Secondary | ICD-10-CM

## 2020-06-25 LAB — CBC
HCT: 26.6 % — ABNORMAL LOW (ref 36.0–46.0)
Hemoglobin: 8.7 g/dL — ABNORMAL LOW (ref 12.0–15.0)
MCH: 27.6 pg (ref 26.0–34.0)
MCHC: 32.7 g/dL (ref 30.0–36.0)
MCV: 84.4 fL (ref 80.0–100.0)
Platelets: 286 10*3/uL (ref 150–400)
RBC: 3.15 MIL/uL — ABNORMAL LOW (ref 3.87–5.11)
RDW: 14.5 % (ref 11.5–15.5)
WBC: 12.8 10*3/uL — ABNORMAL HIGH (ref 4.0–10.5)
nRBC: 0 % (ref 0.0–0.2)

## 2020-06-25 LAB — BASIC METABOLIC PANEL
Anion gap: 11 (ref 5–15)
BUN: 7 mg/dL — ABNORMAL LOW (ref 8–23)
CO2: 27 mmol/L (ref 22–32)
Calcium: 8.4 mg/dL — ABNORMAL LOW (ref 8.9–10.3)
Chloride: 100 mmol/L (ref 98–111)
Creatinine, Ser: 0.71 mg/dL (ref 0.44–1.00)
GFR, Estimated: >60 mL/min (ref >60)
Glucose, Bld: 149 mg/dL — ABNORMAL HIGH (ref 70–99)
Potassium: 3.3 mmol/L — ABNORMAL LOW (ref 3.5–5.1)
Sodium: 138 mmol/L (ref 135–145)

## 2020-06-25 LAB — MAGNESIUM: Magnesium: 1.7 mg/dL (ref 1.7–2.4)

## 2020-06-25 MED ORDER — ACETAMINOPHEN 325 MG PO TABS: 650.0000 mg | ORAL_TABLET | Freq: Four times a day (QID) | ORAL | Status: DC | PRN

## 2020-06-25 MED ORDER — POTASSIUM CHLORIDE CRYS ER 20 MEQ PO TBCR
40.0000 meq | EXTENDED_RELEASE_TABLET | Freq: Two times a day (BID) | ORAL | Status: DC
  Administered 2020-06-25: 40 meq via ORAL
  Filled 2020-06-25: qty 2

## 2020-06-25 NOTE — TOC Progression Note (Signed)
Transition of Care [facility]) - Progression Note    Patient Details  Name: [name] MRN: [id number] Date of Birth: 01-11-1951  Transition of Care [facility]) CM/SW Contact  Ruble Buttler, Edson Snowball, RN Phone Number: 06/25/2020, 1:57 PM  Clinical Narrative:     [place] aware discharge today   Expected Discharge Plan: [place]    Expected Discharge Plan and Services Expected Discharge Plan: [place]   Discharge Planning Services: CM Consult Post Acute Care Choice: [place] arrangements for the past 2 months: Single Family Home Expected Discharge Date: 06/25/20               DME Arranged: N/A DME Agency: NA       HH Arranged: RN [place] Agency: [place] Date [facility] Agency Contacted: 06/23/20 Time [place]: 2122 Representative spoke with at [place]: Tommi Rumps , left message awaiting call back   Social Determinants of Health (SDOH) Interventions    Readmission Risk Interventions No flowsheet data found.

## 2020-06-25 NOTE — Progress Notes (Signed)
Upper extremity venous LT study completed.  Preliminary results relayed to [place], RN.   See CV Proc for preliminary results report.   Darlin Coco, RDMS, RVT

## 2020-06-25 NOTE — Discharge Summary (Signed)
[facility] Discharge Summary   Patient ID: [name] MRN: [id number] DOB/AGE: 1950-10-24 70 y.o.  Admit date: 06/20/2020 Discharge date: 06/25/2020  Discharge Diagnosis Perforated diverticulitis at descending/sigmoid junction HTN  Consultants None  Imaging: VAS [place] UPPER EXTREMITY VENOUS DUPLEX  Result Date: 06/25/2020 UPPER VENOUS STUDY  Patient Name:  [name]  Date of Exam:   06/25/2020 Medical Rec #: [id number]        Accession #:    [id number] Date of Birth: September 22, 1950        Patient Gender: F Patient Age:   70Y Exam Location:  [facility] Procedure:      VAS [place] UPPER EXTREMITY VENOUS DUPLEX Referring Phys: [id number] [place] --------------------------------------------------------------------------------  Indications: Palpable area s/p IV Limitations: Bandages. Comparison Study: No prior studies. Performing Technologist: Darlin Coco RDMS,RVT  Examination Guidelines: A complete evaluation includes B-mode imaging, spectral Doppler, color Doppler, and power Doppler as needed of all accessible portions of each vessel. Bilateral testing is considered an integral part of a complete examination. Limited examinations for reoccurring indications may be performed as noted.  Right Findings: +----------+------------+---------+-----------+----------+-------+ RIGHT     CompressiblePhasicitySpontaneousPropertiesSummary +----------+------------+---------+-----------+----------+-------+ Subclavian    Full       Yes       Yes                      +----------+------------+---------+-----------+----------+-------+  Left Findings: +----------+------------+---------+-----------+----------+-----------------+ LEFT      CompressiblePhasicitySpontaneousProperties     Summary      +----------+------------+---------+-----------+----------+-----------------+ IJV           Full       Yes       Yes                                 +----------+------------+---------+-----------+----------+-----------------+ Subclavian    Full       Yes       Yes                                +----------+------------+---------+-----------+----------+-----------------+ Axillary      Full       Yes       Yes                                +----------+------------+---------+-----------+----------+-----------------+ Brachial      Full                                                    +----------+------------+---------+-----------+----------+-----------------+ Radial        Full                                                    +----------+------------+---------+-----------+----------+-----------------+ Ulnar         Full                                                    +----------+------------+---------+-----------+----------+-----------------+  Cephalic      None       No        No               Age Indeterminate +----------+------------+---------+-----------+----------+-----------------+ Basilic       Full                                                    +----------+------------+---------+-----------+----------+-----------------+  Summary:  Right: No evidence of thrombosis in the subclavian.  Left: Findings consistent with age indeterminate superficial vein thrombosis involving the left cephalic vein.  *See table(s) above for measurements and observations.    Preliminary     Procedures Dr. Dema Severin (06/20/2020) - Exploratory laparotomy with sigmoidectomy, takedown of splenic flexure, partial omentectomy, creation of end descending colostomy  Hospital Course:  [name] is a 70yo female PMH HTN who presented to [facility] 4/22 with 3 days of abdominal pain.  Workup included CT scan which showed Scattered pneumoperitoneum compatible with perforated viscus.  Patient was admitted to the surgical service and taken emergently to the operating room. Intraoperatively she was found to have Perforated  diverticulitis at descending/sigmoid junction. She underwent Exploratory laparotomy with sigmoidectomy, takedown of splenic flexure, partial omentectomy, creation of end descending colostomy. Tolerated procedure well and was transferred to the floor.  She was kept on IV zosyn during admission. Diet was advanced as tolerated. She complained of LUE swelling therefore u/s was obtained and showed superficial venous thrombosis. On POD#5, the patient was voiding well, tolerating diet, ambulating well, pain well controlled, vital signs stable, incisions c/d/i and felt stable for discharge home.  Patient will follow up as below and knows to call with questions or concerns.     Allergies as of 06/25/2020      Reactions   Lyrica [pregabalin] Other (See Comments)   Extreme dry mouth, dizziness   Tramadol Nausea And Vomiting, Other (See Comments)   Dizziness      Medication List    TAKE these medications   acetaminophen 325 MG tablet Commonly known as: TYLENOL Take 2 tablets (650 mg total) by mouth every 6 (six) hours as needed for mild pain.   amLODipine 2.5 MG tablet Commonly known as: NORVASC Take 1 tablet (2.5 mg total) by mouth daily.   b complex vitamins capsule Take 1 capsule by mouth daily.   BIOTIN PLUS KERATIN PO Take 1 capsule by mouth daily.   Krill Oil 1000 MG Caps Take 1,000 mg by mouth every Monday, Wednesday, and Friday.   meloxicam 15 MG tablet Commonly known as: MOBIC Take 1 tablet (15 mg total) by mouth daily. What changed:   when to take this  reasons to take this   multivitamin tablet Take 1 tablet by mouth daily.   Olive Leaf Extract 500 MG Caps Take 500 mg by mouth every Monday, Wednesday, and Friday.   OVER THE COUNTER MEDICATION Take 1 tablet by mouth daily. tumeric   Vitamin D3 125 MCG (5000 UT) Caps Take 5,000 Units by mouth 2 (two) times a week. Monday and Friday         Follow-up Information    [facility], [facility] Follow up.   Specialty:  Home Health Services Contact information: [place] [address] [phone]        Ileana Roup, MD.  Go on 07/15/2020.   Specialties: General Surgery, Colon and Rectal Surgery Why: Your appointment is 5/17 at 9:30am Please arrive 15 minutes early to check in. Contact information: [place] [address] [phone]        [facility], [place]. Go on 06/30/2020.   Specialty: General Surgery Why: Your appointment is 5/2 at 10:30am for staple removal Please arrivce 30 minutes prior to your appointment to check in and fill out paperwork. Bring photo ID and insurance information. Contact information: [address] [place] [phone]              Signed: Norm Parcel, [facility] 06/25/2020, 1:06 PM Please see Amion for pager number during day hours 7:00am-4:30pm

## 2020-06-25 NOTE — Progress Notes (Signed)
[facility] Progress Note  5 Days Post-Op  Subjective: CC-  Feels great. Denies abdominal pain, nausea, vomiting. Tolerating diet and colostomy functioning. States that she noticed some pain/swelling in her LUE.  Objective: Vital signs in last 24 hours: Temp:  [97.4 F (36.3 C)-98.9 F (37.2 C)] 98.9 F (37.2 C) (04/27 0419) Pulse Rate:  [94-101] 94 (04/27 0419) Resp:  [17] 17 (04/27 0419) BP: (117-131)/(80-89) 123/82 (04/27 0419) SpO2:  [94 %-96 %] 94 % (04/27 0419) Last BM Date: 06/24/20  Intake/Output from previous day: 04/26 0701 - 04/27 0700 In: 840 [P.O.:840] Out: 125 [Stool:125] Intake/Output this shift: No intake/output data recorded.  PE: Gen: Alert, NAD, pleasant HEENT: EOM's intact, pupils equal and round Pulm: rate and effort normal Abd: Soft,nondistended,nontender, open midline incision pink without erythema or purulent drainage/ few staples present at proximal aspect of wound, ostomy viable withair and thin liquid stool in pouch  Ext: LUE with palpable nodule proximal to IV  Lab Results:  Recent Labs    06/24/20 0257 06/25/20 0217  WBC 11.7* 12.8*  HGB 9.1* 8.7*  HCT 28.0* 26.6*  PLT 272 286   BMET Recent Labs    06/24/20 0257 06/25/20 0217  NA 137 138  K 3.9 3.3*  CL 102 100  CO2 26 27  GLUCOSE 150* 149*  BUN 7* 7*  CREATININE 0.70 0.71  CALCIUM 8.5* 8.4*   PT/INR No results for input(s): LABPROT, INR in the last 72 hours. CMP     Component Value Date/Time   NA 138 06/25/2020 0217   K 3.3 (L) 06/25/2020 0217   CL 100 06/25/2020 0217   CO2 27 06/25/2020 0217   GLUCOSE 149 (H) 06/25/2020 0217   BUN 7 (L) 06/25/2020 0217   CREATININE 0.71 06/25/2020 0217   CREATININE 0.83 04/26/2018 1227   CALCIUM 8.4 (L) 06/25/2020 0217   PROT 6.9 06/20/2020 0947   ALBUMIN 3.3 (L) 06/20/2020 0947   AST 17 06/20/2020 0947   AST 17 04/26/2018 1227   ALT 14 06/20/2020 0947   ALT 13 04/26/2018 1227   ALKPHOS 102 06/20/2020 0947    BILITOT 1.3 (H) 06/20/2020 0947   BILITOT 0.5 04/26/2018 1227   GFRNONAA >60 06/25/2020 0217   GFRNONAA >60 04/26/2018 1227   GFRAA >60 05/24/2018 0157   GFRAA >60 04/26/2018 1227   Lipase     Component Value Date/Time   LIPASE 21 06/20/2020 0947       Studies/Results: No results found.  Anti-infectives: Anti-infectives (From admission, onward)   Start     Dose/Rate Route Frequency Ordered Stop   06/21/20 1000  piperacillin-tazobactam (ZOSYN) IVPB 3.375 g        3.375 g 12.5 mL/hr over 240 Minutes Intravenous Every 8 hours 06/21/20 1002     06/20/20 1900  metroNIDAZOLE (FLAGYL) IVPB 500 mg  Status:  Discontinued        500 mg 100 mL/hr over 60 Minutes Intravenous  Once 06/20/20 1849 06/20/20 2057   06/20/20 1900  cefTRIAXone (ROCEPHIN) injection 1 g  Status:  Discontinued        1 g Intramuscular  Once 06/20/20 1851 06/20/20 2057   06/20/20 1845  cefTRIAXone (ROCEPHIN) injection 1 g  Status:  Discontinued        1 g Intramuscular Every 24 hours 06/20/20 1848 06/20/20 1852   06/20/20 1500  piperacillin-tazobactam (ZOSYN) IVPB 3.375 g  Status:  Discontinued        3.375 g 12.5 mL/hr over 240 Minutes  Intravenous Every 8 hours 06/20/20 1457 06/20/20 2240       Assessment/Plan HTN- home med  Perforated diverticulitis at descending/sigmoid junction S/pExploratory laparotomy with sigmoidectomy, takedown of splenic flexure, partial omentectomy, creation of end descending colostomy4/22 Dr. Dema Severin -POD#5 - surgical path: Diverticulitis with perforation, pericolonic abscess and acute  Serositis; No evidence of malignancy - WOC following for for new colostomy education - BID wet to dry dressing changes to midline abdominal incision - Replete K. Continue soft diet. - Will obtain u/s to rule out UE DVT. If negative will plan to discharge later today.  ID -zosyn 4/22>>day#5 FEN - soft diet, Boost VTE -sq heparin Foley -d/c 4/23 Follow up -ostomy clinic, Dr. Dema Severin     LOS: 5 days    Wellington Hampshire, [facility] 06/25/2020, 8:50 AM Please see Amion for pager number during day hours 7:00am-4:30pm

## 2020-06-25 NOTE — Progress Notes (Signed)
[name] to be D/C'd  per MD order. Discussed with the patient and all questions fully answered.  VSS, Skin clean, dry and intact without evidence of skin break down, no evidence of skin tears noted.  Ostomy bag burped. Abdominal dressing changed.  IV catheter discontinued intact. Site without signs and symptoms of complications. Dressing and pressure applied.  An After Visit Summary was printed and given to the patient.  D/c education completed with patient/family including follow up instructions, medication list, d/c activities limitations if indicated, with other d/c instructions as indicated by MD - patient able to verbalize understanding, all questions fully answered.   Patient instructed to return to ED, call 911, or call MD for any changes in condition.   Patient to be escorted via [place], and D/C home via private auto.

## 2020-06-25 NOTE — Consult Note (Signed)
[facility] Nurse ostomy follow up Stoma type/location: RUQ, end colostomy Stomal assessment/size: 1 1/4 slightly oval shaped , pink and moist Peristomal assessment: intact  Treatment options for stomal/peristomal skin: 2" skin barrier; education on use of and additional 1/2 of skin barrier from 6-12 o'clock  Output gelatinous green Ostomy pouching: 1pc.flex convex with 2" skin barrier ring Education on use of belt; will ask [place] to send her smaller belt Education provided:  Explained stoma characteristics (budded, flush, color, texture, care) Demonstrated pouch change; patient cut new skin barrier yesterday, cleaning peristomal skin and stoma, use of barrier ring) Patient reports comfort with emptying and cleaning spout.  Discussed bathing, diet, gas, medication use, constipation Enrolled patient in [place] Start Discharge program: Yes Patient plans for DC today, will have [place] and will have follow up in the outpatient ostomy clinic  [place], [place], [place]

## 2020-06-30 ENCOUNTER — Telehealth (HOSPITAL_COMMUNITY): Payer: Self-pay | Admitting: Nurse Practitioner

## 2020-07-15 ENCOUNTER — Other Ambulatory Visit: Payer: Self-pay | Admitting: Internal Medicine

## 2020-08-01 ENCOUNTER — Ambulatory Visit (HOSPITAL_COMMUNITY): Payer: Federal, State, Local not specified - PPO

## 2020-08-04 ENCOUNTER — Ambulatory Visit (HOSPITAL_COMMUNITY)
Admission: RE | Admit: 2020-08-04 | Discharge: 2020-08-04 | Disposition: A | Discharge status: Home / Self Care | Payer: Federal, State, Local not specified - PPO | Source: Ambulatory Visit | Attending: Surgery | Admitting: Surgery

## 2020-08-04 ENCOUNTER — Other Ambulatory Visit: Payer: Self-pay

## 2020-08-04 DIAGNOSIS — Z933 Colostomy status: Secondary | ICD-10-CM | POA: Diagnosis not present

## 2020-08-04 DIAGNOSIS — L259 Unspecified contact dermatitis, unspecified cause: Secondary | ICD-10-CM

## 2020-08-04 DIAGNOSIS — K9400 Colostomy complication, unspecified: Secondary | ICD-10-CM | POA: Diagnosis not present

## 2020-08-04 NOTE — Progress Notes (Addendum)
[facility]   Reason for visit:  Management of colostomy, ongoing education.  Leaks and skin irritation around stoma HPI:  Perforated diverticulum with Hartmann's procedure and colostomy in LMQ near umbilicus ROS  Review of Systems Vital signs:  BP 115/73 (BP Location: Left Arm)   Pulse 91   Temp 98.4 F (36.9 C) (Oral)   Resp 20   SpO2 100%  Exam:  Physical Exam Vitals reviewed.  Abdominal:     General: Bowel sounds are normal.     Palpations: Abdomen is soft.    Skin:    General: Skin is warm.     Findings: Rash present.         Stoma type/location:  LMQ  Stomal assessment/size:  1 3/8" pink and moist.  Os points towards 9 o'clock and where patient is leaking.  thick stool from stoma.  This is normal she states Peristomal assessment:  Denuded skin from 3 to 9 o'clock.  Complains of burning beneath barrier at times. Aside from contact dermatitis, no redness or tenderness. Midline abdominal incision and umbilical open wound.  Treatment options for stomal/peristomal skin: Switching to Ceraplus products.  Ceramides may help heal and soothe this irritation. If not, we will switch to [employer]: thick brown stool. She has noted difficulty with emptying (milking) stool down pouch to empty. I have encouraged her to take daily stool softeners and see if this improves.   Ostomy pouching: 1pc.felxible convex with thin barrier ring.  Both are ceraplus.  We have applied stoma powder and no sting skin prep to the denuded skin. She already wears a belt.  Education provided:  We implement Ceraplus products today to protect and heal skin.  We are crusting with powder and skin prep  Measured and cut barrier to fit and sent home with pattern.  I sent her home with one additional cera plus pouch and ring.  IF this helps, she will order this going forward.  If not, we will enroll in [place] program for supplies.     Impression/dx  Contact dermatitis Discussion  Changed to  cera plus products Plan  New pouch.  Call office with update on how this works.      Visit time: 60 minutes.   Domenic Moras FNP-BC

## 2020-08-04 NOTE — Discharge Instructions (Signed)
We have switched to thin barrier ring and pouch with cera plus. The numbers to re-order are on the products.   If this improves skin, will order this going forward.  If no improvement, we will implement Coloplast samples.

## 2020-08-05 ENCOUNTER — Encounter: Payer: Self-pay | Admitting: Internal Medicine

## 2020-08-05 ENCOUNTER — Ambulatory Visit (INDEPENDENT_AMBULATORY_CARE_PROVIDER_SITE_OTHER): Payer: Federal, State, Local not specified - PPO | Admitting: Internal Medicine

## 2020-08-05 VITALS — BP 128/82 | HR 71 | Temp 98.4°F | Ht 64.5 in | Wt 119.0 lb

## 2020-08-05 DIAGNOSIS — I10 Essential (primary) hypertension: Secondary | ICD-10-CM

## 2020-08-05 DIAGNOSIS — D509 Iron deficiency anemia, unspecified: Secondary | ICD-10-CM

## 2020-08-05 DIAGNOSIS — Z933 Colostomy status: Secondary | ICD-10-CM | POA: Diagnosis not present

## 2020-08-05 DIAGNOSIS — E559 Vitamin D deficiency, unspecified: Secondary | ICD-10-CM

## 2020-08-05 DIAGNOSIS — Z0000 Encounter for general adult medical examination without abnormal findings: Secondary | ICD-10-CM | POA: Diagnosis not present

## 2020-08-05 LAB — CBC WITH DIFFERENTIAL/PLATELET
Basophils Absolute: 0.1 10*3/uL (ref 0.0–0.1)
Basophils Relative: 1.2 % (ref 0.0–3.0)
Eosinophils Absolute: 0.2 10*3/uL (ref 0.0–0.7)
Eosinophils Relative: 4.4 % (ref 0.0–5.0)
HCT: 35.4 % — ABNORMAL LOW (ref 36.0–46.0)
Hemoglobin: 11.6 g/dL — ABNORMAL LOW (ref 12.0–15.0)
Lymphocytes Relative: 31.5 % (ref 12.0–46.0)
Lymphs Abs: 1.4 10*3/uL (ref 0.7–4.0)
MCHC: 32.7 g/dL (ref 30.0–36.0)
MCV: 83.4 fl (ref 78.0–100.0)
Monocytes Absolute: 0.4 10*3/uL (ref 0.1–1.0)
Monocytes Relative: 9.6 % (ref 3.0–12.0)
Neutro Abs: 2.4 10*3/uL (ref 1.4–7.7)
Neutrophils Relative %: 53.3 % (ref 43.0–77.0)
Platelets: 288 10*3/uL (ref 150.0–400.0)
RBC: 4.25 Mil/uL (ref 3.87–5.11)
RDW: 15.6 % — ABNORMAL HIGH (ref 11.5–15.5)
WBC: 4.6 10*3/uL (ref 4.0–10.5)

## 2020-08-05 LAB — LIPID PANEL
Cholesterol: 206 mg/dL — ABNORMAL HIGH (ref 0–200)
HDL: 75.6 mg/dL (ref >39.00)
LDL Cholesterol: 119 mg/dL — ABNORMAL HIGH (ref 0–99)
NonHDL: 129.91
Total CHOL/HDL Ratio: 3
Triglycerides: 57 mg/dL (ref 0.0–149.0)
VLDL: 11.4 mg/dL (ref 0.0–40.0)

## 2020-08-05 LAB — COMPREHENSIVE METABOLIC PANEL
ALT: 10 U/L (ref 0–35)
AST: 15 U/L (ref 0–37)
Albumin: 4.4 g/dL (ref 3.5–5.2)
Alkaline Phosphatase: 77 U/L (ref 39–117)
BUN: 17 mg/dL (ref 6–23)
CO2: 26 mEq/L (ref 19–32)
Calcium: 9.5 mg/dL (ref 8.4–10.5)
Chloride: 104 mEq/L (ref 96–112)
Creatinine, Ser: 0.71 mg/dL (ref 0.40–1.20)
GFR: 86.83 mL/min (ref >60.00)
Glucose, Bld: 95 mg/dL (ref 70–99)
Potassium: 4 mEq/L (ref 3.5–5.1)
Sodium: 139 mEq/L (ref 135–145)
Total Bilirubin: 0.5 mg/dL (ref 0.2–1.2)
Total Protein: 7.4 g/dL (ref 6.0–8.3)

## 2020-08-05 LAB — URINALYSIS
Bilirubin Urine: NEGATIVE
Ketones, ur: NEGATIVE
Leukocytes,Ua: NEGATIVE
Nitrite: NEGATIVE
Specific Gravity, Urine: 1.01 (ref 1.000–1.030)
Total Protein, Urine: NEGATIVE
Urine Glucose: NEGATIVE
Urobilinogen, UA: 0.2 (ref 0.0–1.0)
pH: 6 (ref 5.0–8.0)

## 2020-08-05 LAB — VITAMIN D 25 HYDROXY (VIT D DEFICIENCY, FRACTURES): VITD: 63.04 ng/mL (ref 30.00–100.00)

## 2020-08-05 LAB — MAGNESIUM: Magnesium: 2.2 mg/dL (ref 1.5–2.5)

## 2020-08-05 LAB — TSH: TSH: 1.96 u[IU]/mL (ref 0.35–4.50)

## 2020-08-05 LAB — VITAMIN B12: Vitamin B-12: 804 pg/mL (ref 211–911)

## 2020-08-05 NOTE — Assessment & Plan Note (Signed)
  We discussed age appropriate health related issues, including available/recomended screening tests and vaccinations. Labs were ordered to be later reviewed . All questions were answered. We discussed one or more of the following - seat belt use, use of sunscreen/sun exposure exercise, safe sex, fall risk reduction, second hand smoke exposure, firearm use and storage, seat belt use, a need for adhering to healthy diet and exercise. Labs were ordered.  All questions were answered. Dr. Dema Severin (06/20/2020) - Exploratory laparotomy with sigmoidectomy, takedown of splenic flexure, partial omentectomy, creation of end descending colostomy Prevnar PAP/mammo/DEXA - Dr Gertie Fey Colon 2015, due 2020 Dr Fuller Plan Last colon 2020, due 2027  A cardiac CT scan for calcium scoring offered 2021

## 2020-08-05 NOTE — Addendum Note (Signed)
Addended by: Boris Lown B on: 08/05/2020 09:29 AM   Modules accepted: Orders

## 2020-08-05 NOTE — Progress Notes (Signed)
Subjective:  Patient ID: [name], female    DOB: 09/25/1950  Age: 70 y.o. MRN: [id number]  CC: Annual Exam   HPI [name] presents for a well exam  Per hx:  "Admit date: 06/20/2020 Discharge date: 06/25/2020  Discharge Diagnosis Perforated diverticulitis at descending/sigmoid junction HTN  Consultants None  Imaging:  Imaging Results (Last 48 hours)  VAS [place] UPPER EXTREMITY VENOUS DUPLEX  Result Date: 06/25/2020 UPPER VENOUS STUDY  Patient Name:  TANISHA LUTES  Date of Exam:   06/25/2020 Medical Rec #: [id number]        Accession #:    [id number] Date of Birth: 07/25/1950        Patient Gender: F Patient Age:   70Y Exam Location:  [facility] Procedure:      VAS [place] UPPER EXTREMITY VENOUS DUPLEX Referring Phys: [id number] [place] --------------------------------------------------------------------------------  Indications: Palpable area s/p IV Limitations: Bandages. Comparison Study: No prior studies. Performing Technologist: Darlin Coco RDMS,RVT  Examination Guidelines: A complete evaluation includes B-mode imaging, spectral Doppler, color Doppler, and power Doppler as needed of all accessible portions of each vessel. Bilateral testing is considered an integral part of a complete examination. Limited examinations for reoccurring indications may be performed as noted.  Right Findings: +----------+------------+---------+-----------+----------+-------+ RIGHT     CompressiblePhasicitySpontaneousPropertiesSummary +----------+------------+---------+-----------+----------+-------+ Subclavian    Full       Yes       Yes                      +----------+------------+---------+-----------+----------+-------+  Left Findings: +----------+------------+---------+-----------+----------+-----------------+ LEFT      CompressiblePhasicitySpontaneousProperties     Summary      +----------+------------+---------+-----------+----------+-----------------+ IJV            Full       Yes       Yes                                +----------+------------+---------+-----------+----------+-----------------+ Subclavian    Full       Yes       Yes                                +----------+------------+---------+-----------+----------+-----------------+ Axillary      Full       Yes       Yes                                +----------+------------+---------+-----------+----------+-----------------+ Brachial      Full                                                    +----------+------------+---------+-----------+----------+-----------------+ Radial        Full                                                    +----------+------------+---------+-----------+----------+-----------------+ Ulnar         Full                                                    +----------+------------+---------+-----------+----------+-----------------+  Cephalic      None       No        No               Age Indeterminate +----------+------------+---------+-----------+----------+-----------------+ Basilic       Full                                                    +----------+------------+---------+-----------+----------+-----------------+  Summary:  Right: No evidence of thrombosis in the subclavian.  Left: Findings consistent with age indeterminate superficial vein thrombosis involving the left cephalic vein.  *See table(s) above for measurements and observations.    Preliminary      Procedures Dr. Dema Severin (06/20/2020) - Exploratory laparotomy with sigmoidectomy, takedown of splenic flexure, partial omentectomy, creation of end descending colostomy  Hospital Course:  [name] is a 70yo female PMH HTN who presented to [facility] 4/22 with 3 days of abdominal pain.  Workup included CT scan which showed Scattered pneumoperitoneum compatible with perforated viscus.  Patient was admitted to the surgical service and taken emergently to the  operating room. Intraoperatively she was found to have Perforated diverticulitis at descending/sigmoid junction. She underwent Exploratory laparotomy with sigmoidectomy, takedown of splenic flexure, partial omentectomy, creation of end descending colostomy. Tolerated procedure well and was transferred to the floor.  She was kept on IV zosyn during admission. Diet was advanced as tolerated. She complained of LUE swelling therefore u/s was obtained and showed superficial venous thrombosis. On POD#5, the patient was voiding well, tolerating diet, ambulating well, pain well controlled, vital signs stable, incisions c/d/i and felt stable for discharge home.  Patient will follow up as below and knows to call with questions or concerns.          Allergies as of 06/25/2020      Reactions   Lyrica [pregabalin] Other (See Comments)   Extreme dry mouth, dizziness   Tramadol Nausea And Vomiting, Other (See Comments)   Dizziness         Medication List    TAKE these medications   acetaminophen 325 MG tablet Commonly known as: TYLENOL Take 2 tablets (650 mg total) by mouth every 6 (six) hours as needed for mild pain.   amLODipine 2.5 MG tablet Commonly known as: NORVASC Take 1 tablet (2.5 mg total) by mouth daily.   b complex vitamins capsule Take 1 capsule by mouth daily.   BIOTIN PLUS KERATIN PO Take 1 capsule by mouth daily.   Krill Oil 1000 MG Caps Take 1,000 mg by mouth every Monday, Wednesday, and Friday.   meloxicam 15 MG tablet Commonly known as: MOBIC Take 1 tablet (15 mg total) by mouth daily. What changed:   when to take this  reasons to take this   multivitamin tablet Take 1 tablet by mouth daily.   Olive Leaf Extract 500 MG Caps Take 500 mg by mouth every Monday, Wednesday, and Friday.   OVER THE COUNTER MEDICATION Take 1 tablet by mouth daily. tumeric   Vitamin D3 125 MCG (5000 UT) Caps Take 5,000 Units by mouth 2 (two) times a week. Monday and  Friday         Follow-up Information        [facility] Follow up.   Specialty: Home Health Services Contact information: [place], Christopher M, MD. Go on 07/15/2020.   Specialties: General Surgery, Colon and Rectal Surgery Why: Your appointment is 5/17 at 9:30am Please arrive 15 minutes early to check in. Contact information: [facility] [address] [phone]         [facility], [place]. Go on 06/30/2020.   Specialty: General Surgery Why: Your appointment is 5/2 at 10:30am for staple removal Please arrivce 30 minutes prior to your appointment to check in and fill out paperwork. Bring photo ID and insurance information. Contact information: [address] [place] [phone]              "  Outpatient Medications Prior to Visit  Medication Sig Dispense Refill  . amLODipine (NORVASC) 2.5 MG tablet Take 1 tablet (2.5 mg total) by mouth daily. Annual appt is must see provider for future refills 30 tablet 0  . b complex vitamins capsule Take 1 capsule by mouth daily.    . Cholecalciferol (VITAMIN D3) 125 MCG (5000 UT) CAPS Take 5,000 Units by mouth 2 (two) times a week. Monday and Friday    . Krill Oil 1000 MG CAPS Take 1,000 mg by mouth every Monday, Wednesday, and Friday.    . meloxicam (MOBIC) 15 MG tablet Take 1 tablet (15 mg total) by mouth daily. (Patient taking differently: Take 15 mg by mouth daily as needed for pain.) 90 tablet 1  . Multiple Vitamin (MULTIVITAMIN) tablet Take 1 tablet by mouth daily.    Jonna Coup Leaf Extract 500 MG CAPS Take 500 mg by mouth every Monday, Wednesday, and Friday.    Marland Kitchen OVER THE COUNTER MEDICATION Take 1 tablet by mouth daily. tumeric    . Specialty Vitamins Products (BIOTIN PLUS KERATIN PO) Take 1 capsule by mouth daily.    Marland Kitchen acetaminophen (TYLENOL) 325 MG  tablet Take 2 tablets (650 mg total) by mouth every 6 (six) hours as needed for mild pain.     No facility-administered medications prior to visit.    ROS: Review of Systems  Constitutional: Positive for unexpected weight change. Negative for activity change, appetite change, chills and fatigue.  HENT: Negative for congestion, mouth sores and sinus pressure.   Eyes: Negative for visual disturbance.  Respiratory: Negative for cough and chest tightness.   Gastrointestinal: Negative for abdominal pain and nausea.  Genitourinary: Negative for difficulty urinating, frequency and vaginal pain.  Musculoskeletal: Negative for back pain and gait problem.  Skin: Negative for pallor and rash.  Neurological: Negative for dizziness, tremors, weakness, numbness and headaches.  Psychiatric/Behavioral: Negative for confusion, sleep disturbance and suicidal ideas.    Objective:  BP 128/82 (BP Location: Left Arm)   Pulse 71   Temp 98.4 F (36.9 C) (Oral)   Ht 5' 4.5" (1.638 m)   Wt 119 lb (54 kg)   SpO2 95%   BMI 20.11 kg/m   BP Readings from Last 3 Encounters:  08/05/20 128/82  08/04/20 115/73  06/25/20 122/79    Wt Readings from Last 3 Encounters:  08/05/20 119 lb (54 kg)  06/20/20 122 lb (55.3 kg)  07/24/19 128 lb (58.1 kg)    Physical Exam Constitutional:      General: She is not in acute distress.    Appearance: She is well-developed.  HENT:     Head: Normocephalic.     Right Ear: External ear normal.  Left Ear: External ear normal.     Nose: Nose normal.  Eyes:     General:        Right eye: No discharge.        Left eye: No discharge.     Conjunctiva/sclera: Conjunctivae normal.     Pupils: Pupils are equal, round, and reactive to light.  Neck:     Thyroid: No thyromegaly.     Vascular: No JVD.     Trachea: No tracheal deviation.  Cardiovascular:     Rate and Rhythm: Normal rate and regular rhythm.     Heart sounds: Normal heart sounds.  Pulmonary:     Effort:  No respiratory distress.     Breath sounds: No stridor. No wheezing.  Abdominal:     General: Bowel sounds are normal. There is no distension.     Palpations: Abdomen is soft. There is no mass.     Tenderness: There is no abdominal tenderness. There is no guarding or rebound.  Musculoskeletal:        General: No tenderness.     Cervical back: Normal range of motion and neck supple.  Lymphadenopathy:     Cervical: No cervical adenopathy.  Skin:    Findings: No erythema or rash.  Neurological:     Cranial Nerves: No cranial nerve deficit.     Motor: No abnormal muscle tone.     Coordination: Coordination normal.     Deep Tendon Reflexes: Reflexes normal.  Psychiatric:        Behavior: Behavior normal.        Thought Content: Thought content normal.        Judgment: Judgment normal.    Colostomy bag L  Lab Results  Component Value Date   WBC 12.8 (H) 06/25/2020   HGB 8.7 (L) 06/25/2020   HCT 26.6 (L) 06/25/2020   PLT 286 06/25/2020   GLUCOSE 149 (H) 06/25/2020   CHOL 209 (H) 07/24/2019   TRIG 70.0 07/24/2019   HDL 76.70 07/24/2019   LDLDIRECT 90.1 03/22/2012   LDLCALC 118 (H) 07/24/2019   ALT 14 06/20/2020   AST 17 06/20/2020   NA 138 06/25/2020   K 3.3 (L) 06/25/2020   CL 100 06/25/2020   CREATININE 0.71 06/25/2020   BUN 7 (L) 06/25/2020   CO2 27 06/25/2020   TSH 1.46 07/24/2019    No results found.  Assessment & Plan:   There are no diagnoses linked to this encounter.   No orders of the defined types were placed in this encounter.    Follow-up: No follow-ups on file.  Walker Kehr, MD

## 2020-08-05 NOTE — Assessment & Plan Note (Signed)
Dr. Dema Severin (06/20/2020) - Exploratory laparotomy with sigmoidectomy, takedown of splenic flexure, partial omentectomy, creation of end descending colostomy

## 2020-08-05 NOTE — Assessment & Plan Note (Signed)
Cont w/Amlodipine

## 2020-08-06 ENCOUNTER — Other Ambulatory Visit: Payer: Self-pay | Admitting: Internal Medicine

## 2020-08-06 LAB — IRON,TIBC AND FERRITIN PANEL
%SAT: 17 % (calc) (ref 16–45)
Ferritin: 91 ng/mL (ref 16–288)
Iron: 45 ug/dL (ref 45–160)
TIBC: 271 mcg/dL (calc) (ref 250–450)

## 2020-08-12 ENCOUNTER — Other Ambulatory Visit: Payer: Self-pay | Admitting: Internal Medicine

## 2020-10-16 ENCOUNTER — Telehealth: Payer: Self-pay | Admitting: Internal Medicine

## 2020-10-16 DIAGNOSIS — K5792 Diverticulitis of intestine, part unspecified, without perforation or abscess without bleeding: Secondary | ICD-10-CM | POA: Insufficient documentation

## 2020-10-16 DIAGNOSIS — M8580 Other specified disorders of bone density and structure, unspecified site: Secondary | ICD-10-CM | POA: Insufficient documentation

## 2020-10-16 NOTE — Telephone Encounter (Signed)
LVM for pt to rtn my call at [phone] to schedule AWV with [facility]. Please schedule AWV if pt calls the office.

## 2020-10-17 LAB — HM PAP SMEAR

## 2020-11-05 ENCOUNTER — Ambulatory Visit: Payer: Federal, State, Local not specified - PPO | Admitting: Internal Medicine

## 2020-11-11 ENCOUNTER — Ambulatory Visit: Payer: Federal, State, Local not specified - PPO | Admitting: Internal Medicine

## 2020-11-24 ENCOUNTER — Other Ambulatory Visit: Payer: Self-pay | Admitting: Surgery

## 2020-11-24 DIAGNOSIS — Z933 Colostomy status: Secondary | ICD-10-CM

## 2020-11-27 ENCOUNTER — Ambulatory Visit
Admission: RE | Admit: 2020-11-27 | Discharge: 2020-11-27 | Disposition: A | Discharge status: Home / Self Care | Payer: Federal, State, Local not specified - PPO | Source: Ambulatory Visit | Attending: Surgery | Admitting: Surgery

## 2020-11-27 ENCOUNTER — Other Ambulatory Visit: Payer: Self-pay | Admitting: Surgery

## 2020-11-27 DIAGNOSIS — Z933 Colostomy status: Secondary | ICD-10-CM

## 2020-11-30 ENCOUNTER — Telehealth: Payer: Self-pay | Admitting: Family Medicine

## 2020-11-30 NOTE — Telephone Encounter (Signed)
Thank you!  AP

## 2020-11-30 NOTE — Telephone Encounter (Signed)
Call received from access nurse. The patient tested positive for COVID last night though has no symptoms. Nurse wonders about paxlovid for the patient. I advised that paxlovid is approved for patients who test positive and have mild to moderate symptoms and are at high risk. Given her lack of symptoms I advised that the paxlovid would not be indicated at this time. Discussed that she should call in to the office tomorrow to follow-up with her PCP. Discussed she should stay quarantined for 10 days and be evaluated/contact her PCP office if she develops any symptoms.

## 2020-12-01 NOTE — Telephone Encounter (Signed)
[facility] FYI 10.2.22...   Caller states he and wife have Covid. Tested positive yesterday. Was sob the past two days but not currently. States wife has hx of breast cancer and surgery in April. Husband has hx of COPD. He is currently at the hospital.   Caller was transferred over to ON-Call provider.

## 2020-12-23 NOTE — Patient Instructions (Addendum)
DUE TO COVID-19 ONLY ONE VISITOR IS ALLOWED TO COME WITH YOU AND STAY IN THE WAITING ROOM ONLY DURING PRE OP AND PROCEDURE.   **NO VISITORS ARE ALLOWED IN THE SHORT STAY AREA OR RECOVERY ROOM!!**  IF YOU WILL BE ADMITTED INTO THE HOSPITAL YOU ARE ALLOWED ONLY TWO SUPPORT PEOPLE DURING VISITATION HOURS ONLY (7 AM -8PM)    Up to two visitors ages 4+ are allowed at one time in a patient's room.  The visitors may rotate out with other people throughout the day.  Additionally, up to two children between the ages of 39 and 75 are allowed and do not count toward the number of allowed visitors.  Children within this age range must be accompanied by an adult visitor.  One adult visitor may remain with the patient overnight and must be in the room by 8 PM.  COVID SWAB TESTING MUST BE COMPLETED ON:  Wednesday, 01-14-21, Between the hours of 8 and 3  **MUST PRESENT COMPLETED FORM AT TESTING SITE**    [place] (backside of the building)  You are not required to quarantine, however you are required to wear a well-fitted mask when you are out and around people not in your household.  Hand Hygiene often Do NOT share personal items Notify your provider if you are in close contact with someone who has COVID or you develop fever 100.4 or greater, new onset of sneezing, cough, sore throat, shortness of breath or body aches.         Your procedure is scheduled on:  Friday, 01-16-21   Report to [facility] Main  Entrance     Report to admitting at 5:15 AM   Call this number if you have problems the morning of surgery [phone]   Follow a clear liquid diet day of prep to avoid dehydration.   Follow prep instructions from surgeon's office   May have liquids until 4:30 AM day of surgery  CLEAR LIQUID DIET  Foods Allowed                                                                     Foods Excluded  Water, Black Coffee (no milk/no creamer) and tea, regular and  decaf                              liquids that you cannot  Plain Jell-O in any flavor  (No red)                         see through such as: Fruit ices (not with fruit pulp)                                 milk, soups, orange juice  Iced Popsicles (No red)                                    All solid food  Apple juices Sports drinks like Gatorade (No red) Lightly seasoned clear broth or consume(fat free) Sugar Sample Menu Breakfast                                Lunch                                     Supper Cranberry juice                    Beef broth                            Chicken broth Jell-O                                     Grape juice                           Apple juice Coffee or tea                        Jell-O                                      Popsicle                                                Coffee or tea                        Coffee or tea       Oral Hygiene is also important to reduce your risk of infection.                                    Remember - BRUSH YOUR TEETH THE MORNING OF SURGERY WITH YOUR REGULAR TOOTHPASTE   Do NOT smoke after Midnight   Take these medicines the morning of surgery with A SIP OF WATER: Amlodipine                 Stop all vitamins and herbal supplements a week before surgery             You may not have any metal on your body including hair pins, jewelry, and body piercing             Do not wear make-up, lotions, powders, perfumes or deodorant  Do not wear nail polish including gel and S&S, artificial/acrylic nails, or any other type of covering on natural nails including finger and toenails. If you have artificial nails, gel coating, etc. that needs to be removed by a nail salon please have this removed prior to surgery or surgery may need to be canceled/ delayed if the surgeon/ anesthesia feels like they are unable to be safely monitored.   Do not shave  48 hours prior to surgery.   Do not  bring valuables to the  hospital. [facility] IS NOT RESPONSIBLE FOR VALUABLES.   Contacts, dentures or bridgework may not be worn into surgery.   Bring small overnight bag day of surgery.  Please read over the following fact sheets you were given: IF YOU HAVE QUESTIONS ABOUT YOUR PRE OP INSTRUCTIONS PLEASE CALL [facility] - Preparing for Surgery Before surgery, you can play an important role.  Because skin is not sterile, your skin needs to be as free of germs as possible.  You can reduce the number of germs on your skin by washing with CHG (chlorahexidine gluconate) soap before surgery.  CHG is an antiseptic cleaner which kills germs and bonds with the skin to continue killing germs even after washing. Please DO NOT use if you have an allergy to CHG or antibacterial soaps.  If your skin becomes reddened/irritated stop using the CHG and inform your nurse when you arrive at Short Stay. Do not shave (including legs and underarms) for at least 48 hours prior to the first CHG shower.  You may shave your face/neck.  Please follow these instructions carefully:  1.  Shower with CHG Soap the night before surgery and the  morning of surgery.  2.  If you choose to wash your hair, wash your hair first as usual with your normal  shampoo.  3.  After you shampoo, rinse your hair and body thoroughly to remove the shampoo.                             4.  Use CHG as you would any other liquid soap.  You can apply chg directly to the skin and wash.  Gently with a scrungie or clean washcloth.  5.  Apply the CHG Soap to your body ONLY FROM THE NECK DOWN.   Do   not use on face/ open                           Wound or open sores. Avoid contact with eyes, ears mouth and   genitals (private parts).                       Wash face,  Genitals (private parts) with your normal soap.             6.  Wash thoroughly, paying special attention to the area where your    surgery  will be performed.  7.   Thoroughly rinse your body with warm water from the neck down.  8.  DO NOT shower/wash with your normal soap after using and rinsing off the CHG Soap.                9.  Pat yourself dry with a clean towel.            10.  Wear clean pajamas.            11.  Place clean sheets on your bed the night of your first shower and do not  sleep with pets. Day of Surgery : Do not apply any lotions/deodorants the morning of surgery.  Please wear clean clothes to the hospital/surgery center.  FAILURE TO FOLLOW THESE INSTRUCTIONS MAY RESULT IN THE CANCELLATION OF YOUR SURGERY  PATIENT SIGNATURE_________________________________  NURSE SIGNATURE__________________________________  ________________________________________________________________________    WHAT IS A BLOOD TRANSFUSION? Blood Transfusion Information  A transfusion is the  replacement of blood or some of its parts. Blood is made up of multiple cells which provide different functions. Red blood cells carry oxygen and are used for blood loss replacement. White blood cells fight against infection. Platelets control bleeding. Plasma helps clot blood. Other blood products are available for specialized needs, such as hemophilia or other clotting disorders. BEFORE THE TRANSFUSION  Who gives blood for transfusions?  Healthy volunteers who are fully evaluated to make sure their blood is safe. This is blood bank blood. Transfusion therapy is the safest it has ever been in the practice of medicine. Before blood is taken from a donor, a complete history is taken to make sure that person has no history of diseases nor engages in risky social behavior (examples are intravenous drug use or sexual activity with multiple partners). The donor's travel history is screened to minimize risk of transmitting infections, such as malaria. The donated blood is tested for signs of infectious diseases, such as HIV and hepatitis. The blood is then tested to be sure it  is compatible with you in order to minimize the chance of a transfusion reaction. If you or a relative donates blood, this is often done in anticipation of surgery and is not appropriate for emergency situations. It takes many days to process the donated blood. RISKS AND COMPLICATIONS Although transfusion therapy is very safe and saves many lives, the main dangers of transfusion include:  Getting an infectious disease. Developing a transfusion reaction. This is an allergic reaction to something in the blood you were given. Every precaution is taken to prevent this. The decision to have a blood transfusion has been considered carefully by your caregiver before blood is given. Blood is not given unless the benefits outweigh the risks. AFTER THE TRANSFUSION Right after receiving a blood transfusion, you will usually feel much better and more energetic. This is especially true if your red blood cells have gotten low (anemic). The transfusion raises the level of the red blood cells which carry oxygen, and this usually causes an energy increase. The nurse administering the transfusion will monitor you carefully for complications. HOME CARE INSTRUCTIONS  No special instructions are needed after a transfusion. You may find your energy is better. Speak with your caregiver about any limitations on activity for underlying diseases you may have. SEEK MEDICAL CARE IF:  Your condition is not improving after your transfusion. You develop redness or irritation at the intravenous (IV) site. SEEK IMMEDIATE MEDICAL CARE IF:  Any of the following symptoms occur over the next 12 hours: Shaking chills. You have a temperature by mouth above 102 F (38.9 C), not controlled by medicine. Chest, back, or muscle pain. People around you feel you are not acting correctly or are confused. Shortness of breath or difficulty breathing. Dizziness and fainting. You get a rash or develop hives. You have a decrease in urine  output. Your urine turns a dark color or changes to pink, red, or brown. Any of the following symptoms occur over the next 10 days: You have a temperature by mouth above 102 F (38.9 C), not controlled by medicine. Shortness of breath. Weakness after normal activity. The white part of the eye turns yellow (jaundice). You have a decrease in the amount of urine or are urinating less often. Your urine turns a dark color or changes to pink, red, or brown. Document Released: 02/13/2000 Document Revised: 05/10/2011 Document Reviewed: 10/02/2007 [facility] Patient Information 2014 [place], [place].  _______________________________________________________________________

## 2020-12-23 NOTE — Progress Notes (Addendum)
COVID swab appointment: 01-14-21  COVID Vaccine Completed: Yes x 2 Date COVID Vaccine completed: 04-13-19, 05-08-19 Has received booster: Yes x1 12-11-19 COVID vaccine manufacturer: Pfizer      Date of COVID positive in last 90 days:  Yes, September by home test.  Did not seek medical attention as she was asymptomatic  PCP - Lew Dawes, MD Cardiologist - N/A  Chest x-ray - N/A EKG - 12-26-20 Epic Stress Test - N/A ECHO - N/A Cardiac Cath - N/A Pacemaker/ICD device last checked: Spinal Cord Stimulator:  Sleep Study - N/A CPAP -   Fasting Blood Sugar - N/A Checks Blood Sugar _____ times a day  Blood Thinner Instructions: N/A Aspirin Instructions: Last Dose:  Activity level:  Can go up a flight of stairs and perform activities of daily living without stopping and without symptoms of chest pain or shortness of breath.    Anesthesia review:  Elevated BP at PAT appointment 152/97, 157/98.  Patient had not taken BP meds and she stated that she has white coat HTN.  She does have BP cuff at home and will monitor and call PCP if it remains elevated.  Patient denies shortness of breath, fever, cough and chest pain at PAT appointment   Patient verbalized understanding of instructions that were given to them at the PAT appointment. Patient was also instructed that they will need to review over the PAT instructions again at home before surgery.

## 2020-12-26 ENCOUNTER — Encounter (HOSPITAL_COMMUNITY)
Admission: RE | Admit: 2020-12-26 | Discharge: 2020-12-26 | Disposition: A | Discharge status: Home / Self Care | Payer: Federal, State, Local not specified - PPO | Source: Ambulatory Visit | Attending: Surgery | Admitting: Surgery

## 2020-12-26 ENCOUNTER — Encounter (HOSPITAL_COMMUNITY): Payer: Self-pay

## 2020-12-26 ENCOUNTER — Other Ambulatory Visit: Payer: Self-pay

## 2020-12-26 VITALS — BP 157/98 | HR 74 | Temp 98.2°F | Resp 18 | Ht 64.0 in | Wt 118.2 lb

## 2020-12-26 DIAGNOSIS — Z01818 Encounter for other preprocedural examination: Secondary | ICD-10-CM | POA: Diagnosis not present

## 2020-12-26 DIAGNOSIS — D649 Anemia, unspecified: Secondary | ICD-10-CM

## 2020-12-26 DIAGNOSIS — I10 Essential (primary) hypertension: Secondary | ICD-10-CM

## 2020-12-26 LAB — BASIC METABOLIC PANEL
Anion gap: 7 (ref 5–15)
BUN: 9 mg/dL (ref 8–23)
CO2: 28 mmol/L (ref 22–32)
Calcium: 9.2 mg/dL (ref 8.9–10.3)
Chloride: 104 mmol/L (ref 98–111)
Creatinine, Ser: 0.64 mg/dL (ref 0.44–1.00)
GFR, Estimated: >60 mL/min (ref >60)
Glucose, Bld: 99 mg/dL (ref 70–99)
Potassium: 3.7 mmol/L (ref 3.5–5.1)
Sodium: 139 mmol/L (ref 135–145)

## 2020-12-26 LAB — CBC
HCT: 41.3 % (ref 36.0–46.0)
Hemoglobin: 12.8 g/dL (ref 12.0–15.0)
MCH: 26.9 pg (ref 26.0–34.0)
MCHC: 31 g/dL (ref 30.0–36.0)
MCV: 86.9 fL (ref 80.0–100.0)
Platelets: 295 10*3/uL (ref 150–400)
RBC: 4.75 MIL/uL (ref 3.87–5.11)
RDW: 15 % (ref 11.5–15.5)
WBC: 4.7 10*3/uL (ref 4.0–10.5)
nRBC: 0 % (ref 0.0–0.2)

## 2020-12-26 NOTE — Progress Notes (Signed)
At PAT appointment patient stated that she had not received antibiotics to take during prep.  Called and spoke with [name] at Dr. Orest Dikes office and she stated that she would send prescription to pharmacy.  Patient stated that she had the Dulocolax and Miralax for prep.

## 2021-01-05 ENCOUNTER — Ambulatory Visit: Payer: Self-pay | Admitting: Surgery

## 2021-01-05 DIAGNOSIS — Z01818 Encounter for other preprocedural examination: Secondary | ICD-10-CM

## 2021-01-14 ENCOUNTER — Other Ambulatory Visit: Payer: Self-pay | Admitting: Surgery

## 2021-01-14 LAB — SARS CORONAVIRUS 2 (TAT 6-24 HRS): SARS Coronavirus 2: NEGATIVE

## 2021-01-15 NOTE — Anesthesia Preprocedure Evaluation (Addendum)
Anesthesia Evaluation  Patient identified by MRN, date of birth, ID band Patient awake    Reviewed: Allergy & Precautions, NPO status , Patient's Chart, lab work & pertinent test results  History of Anesthesia Complications Negative for: history of anesthetic complications  Airway Mallampati: II  TM Distance: >3 FB Neck ROM: Full    Dental  (+) Teeth Intact, Dental Advisory Given   Pulmonary neg pulmonary ROS, former smoker,    Pulmonary exam normal        Cardiovascular hypertension, Pt. on medications Normal cardiovascular exam     Neuro/Psych negative neurological ROS     GI/Hepatic Neg liver ROS, H/o diverticulitis s/p colostomy   Endo/Other  negative endocrine ROS  Renal/GU negative Renal ROS  negative genitourinary   Musculoskeletal  (+) Arthritis , Fibromyalgia -h/o breast cancer s/p mastectomy   Abdominal   Peds  Hematology negative hematology ROS (+)   Anesthesia Other Findings   Reproductive/Obstetrics                            Anesthesia Physical Anesthesia Plan  ASA: 3  Anesthesia Plan: General   Post-op Pain Management:    Induction: Intravenous  PONV Risk Score and Plan: 4 or greater and Ondansetron, Dexamethasone, Treatment may vary due to age or medical condition and Midazolam  Airway Management Planned: Oral ETT  Additional Equipment: None  Intra-op Plan:   Post-operative Plan: Extubation in OR  Informed Consent: I have reviewed the patients History and Physical, chart, labs and discussed the procedure including the risks, benefits and alternatives for the proposed anesthesia with the patient or authorized representative who has indicated his/her understanding and acceptance.     Dental advisory given  Plan Discussed with:   Anesthesia Plan Comments:        Anesthesia Quick Evaluation

## 2021-01-16 ENCOUNTER — Encounter (HOSPITAL_COMMUNITY)
Admission: RE | Disposition: A | Discharge status: Home / Self Care | Payer: Self-pay | Source: Ambulatory Visit | Attending: Surgery

## 2021-01-16 ENCOUNTER — Encounter (HOSPITAL_COMMUNITY): Payer: Self-pay | Admitting: Surgery

## 2021-01-16 ENCOUNTER — Other Ambulatory Visit: Payer: Self-pay

## 2021-01-16 ENCOUNTER — Inpatient Hospital Stay (HOSPITAL_COMMUNITY)
Admission: RE | Admit: 2021-01-16 | Discharge: 2021-01-18 | DRG: 330 | Disposition: A | Discharge status: Home / Self Care | Payer: Medicare Other | Source: Ambulatory Visit | Attending: Surgery | Admitting: Surgery

## 2021-01-16 ENCOUNTER — Inpatient Hospital Stay (HOSPITAL_COMMUNITY): Payer: Medicare Other | Admitting: Physician Assistant

## 2021-01-16 ENCOUNTER — Inpatient Hospital Stay (HOSPITAL_COMMUNITY): Payer: Medicare Other | Admitting: Certified Registered Nurse Anesthetist

## 2021-01-16 DIAGNOSIS — Z8249 Family history of ischemic heart disease and other diseases of the circulatory system: Secondary | ICD-10-CM | POA: Diagnosis not present

## 2021-01-16 DIAGNOSIS — Z433 Encounter for attention to colostomy: Principal | ICD-10-CM

## 2021-01-16 DIAGNOSIS — Z20822 Contact with and (suspected) exposure to covid-19: Secondary | ICD-10-CM | POA: Diagnosis present

## 2021-01-16 DIAGNOSIS — K625 Hemorrhage of anus and rectum: Secondary | ICD-10-CM | POA: Diagnosis not present

## 2021-01-16 DIAGNOSIS — M797 Fibromyalgia: Secondary | ICD-10-CM | POA: Diagnosis present

## 2021-01-16 DIAGNOSIS — Z9889 Other specified postprocedural states: Secondary | ICD-10-CM

## 2021-01-16 DIAGNOSIS — E559 Vitamin D deficiency, unspecified: Secondary | ICD-10-CM | POA: Diagnosis present

## 2021-01-16 DIAGNOSIS — Z87891 Personal history of nicotine dependence: Secondary | ICD-10-CM

## 2021-01-16 DIAGNOSIS — Z8041 Family history of malignant neoplasm of ovary: Secondary | ICD-10-CM

## 2021-01-16 DIAGNOSIS — I10 Essential (primary) hypertension: Secondary | ICD-10-CM | POA: Diagnosis present

## 2021-01-16 DIAGNOSIS — Z853 Personal history of malignant neoplasm of breast: Secondary | ICD-10-CM

## 2021-01-16 DIAGNOSIS — Z833 Family history of diabetes mellitus: Secondary | ICD-10-CM | POA: Diagnosis not present

## 2021-01-16 DIAGNOSIS — Z803 Family history of malignant neoplasm of breast: Secondary | ICD-10-CM | POA: Diagnosis not present

## 2021-01-16 DIAGNOSIS — Z01818 Encounter for other preprocedural examination: Secondary | ICD-10-CM

## 2021-01-16 DIAGNOSIS — K660 Peritoneal adhesions (postprocedural) (postinfection): Secondary | ICD-10-CM | POA: Diagnosis present

## 2021-01-16 DIAGNOSIS — Z9011 Acquired absence of right breast and nipple: Secondary | ICD-10-CM

## 2021-01-16 DIAGNOSIS — E785 Hyperlipidemia, unspecified: Secondary | ICD-10-CM | POA: Diagnosis present

## 2021-01-16 HISTORY — PX: PARTIAL COLECTOMY: SHX5273

## 2021-01-16 HISTORY — PX: LYSIS OF ADHESION: SHX5961

## 2021-01-16 HISTORY — PX: XI ROBOTIC ASSISTED COLOSTOMY TAKEDOWN: SHX6828

## 2021-01-16 LAB — COMPREHENSIVE METABOLIC PANEL
ALT: 15 U/L (ref 0–44)
AST: 21 U/L (ref 15–41)
Albumin: 4 g/dL (ref 3.5–5.0)
Alkaline Phosphatase: 86 U/L (ref 38–126)
Anion gap: 7 (ref 5–15)
BUN: 8 mg/dL (ref 8–23)
CO2: 27 mmol/L (ref 22–32)
Calcium: 8.8 mg/dL — ABNORMAL LOW (ref 8.9–10.3)
Chloride: 103 mmol/L (ref 98–111)
Creatinine, Ser: 0.63 mg/dL (ref 0.44–1.00)
GFR, Estimated: >60 mL/min (ref >60)
Glucose, Bld: 101 mg/dL — ABNORMAL HIGH (ref 70–99)
Potassium: 3.7 mmol/L (ref 3.5–5.1)
Sodium: 137 mmol/L (ref 135–145)
Total Bilirubin: 0.4 mg/dL (ref 0.3–1.2)
Total Protein: 6.9 g/dL (ref 6.5–8.1)

## 2021-01-16 SURGERY — CLOSURE, COLOSTOMY, ROBOT-ASSISTED
Anesthesia: General | Site: Abdomen

## 2021-01-16 MED ORDER — PROPOFOL 10 MG/ML IV BOLUS
INTRAVENOUS | Status: DC | PRN
  Administered 2021-01-16: 150 mg via INTRAVENOUS

## 2021-01-16 MED ORDER — KETAMINE HCL 10 MG/ML IJ SOLN
INTRAMUSCULAR | Status: DC | PRN
  Administered 2021-01-16: 30 mg via INTRAVENOUS

## 2021-01-16 MED ORDER — ACETAMINOPHEN 500 MG PO TABS
1000.0000 mg | ORAL_TABLET | ORAL | Status: AC
  Administered 2021-01-16: 1000 mg via ORAL

## 2021-01-16 MED ORDER — ALVIMOPAN 12 MG PO CAPS
12.0000 mg | ORAL_CAPSULE | Freq: Two times a day (BID) | ORAL | Status: DC
  Administered 2021-01-17 (×2): 12 mg via ORAL
  Filled 2021-01-16 (×2): qty 1

## 2021-01-16 MED ORDER — HEPARIN SODIUM (PORCINE) 5000 UNIT/ML IJ SOLN
5000.0000 [IU] | Freq: Three times a day (TID) | INTRAMUSCULAR | Status: DC
  Administered 2021-01-17 – 2021-01-18 (×4): 5000 [IU] via SUBCUTANEOUS
  Filled 2021-01-16 (×4): qty 1

## 2021-01-16 MED ORDER — BUPIVACAINE LIPOSOME 1.3 % IJ SUSP: 20.0000 mL | Freq: Once | INTRAMUSCULAR | Status: DC

## 2021-01-16 MED ORDER — ONDANSETRON HCL 4 MG PO TABS: 4.0000 mg | ORAL_TABLET | Freq: Four times a day (QID) | ORAL | Status: DC | PRN

## 2021-01-16 MED ORDER — BUPIVACAINE-EPINEPHRINE (PF) 0.25% -1:200000 IJ SOLN
INTRAMUSCULAR | Status: AC
  Filled 2021-01-16: qty 30

## 2021-01-16 MED ORDER — ONDANSETRON HCL 4 MG/2ML IJ SOLN: 4.0000 mg | Freq: Four times a day (QID) | INTRAMUSCULAR | Status: DC | PRN

## 2021-01-16 MED ORDER — DIPHENHYDRAMINE HCL 12.5 MG/5ML PO ELIX: 12.5000 mg | ORAL_SOLUTION | Freq: Four times a day (QID) | ORAL | Status: DC | PRN

## 2021-01-16 MED ORDER — CHLORHEXIDINE GLUCONATE CLOTH 2 % EX PADS: 6.0000 | MEDICATED_PAD | Freq: Once | CUTANEOUS | Status: DC

## 2021-01-16 MED ORDER — ONDANSETRON HCL 4 MG/2ML IJ SOLN
INTRAMUSCULAR | Status: AC
  Filled 2021-01-16: qty 2

## 2021-01-16 MED ORDER — FENTANYL CITRATE PF 50 MCG/ML IJ SOSY
PREFILLED_SYRINGE | INTRAMUSCULAR | Status: AC
  Filled 2021-01-16: qty 3

## 2021-01-16 MED ORDER — SUGAMMADEX SODIUM 200 MG/2ML IV SOLN
INTRAVENOUS | Status: DC | PRN
  Administered 2021-01-16: 200 mg via INTRAVENOUS

## 2021-01-16 MED ORDER — ALUM & MAG HYDROXIDE-SIMETH 200-200-20 MG/5ML PO SUSP: 30.0000 mL | Freq: Four times a day (QID) | ORAL | Status: DC | PRN

## 2021-01-16 MED ORDER — PHENYLEPHRINE 40 MCG/ML (10ML) SYRINGE FOR IV PUSH (FOR BLOOD PRESSURE SUPPORT)
PREFILLED_SYRINGE | INTRAVENOUS | Status: DC | PRN
  Administered 2021-01-16: 120 ug via INTRAVENOUS
  Administered 2021-01-16 (×2): 80 ug via INTRAVENOUS

## 2021-01-16 MED ORDER — ONDANSETRON HCL 4 MG/2ML IJ SOLN
INTRAMUSCULAR | Status: DC | PRN
  Administered 2021-01-16: 4 mg via INTRAVENOUS

## 2021-01-16 MED ORDER — EPHEDRINE SULFATE-NACL 50-0.9 MG/10ML-% IV SOSY
PREFILLED_SYRINGE | INTRAVENOUS | Status: DC | PRN
  Administered 2021-01-16 (×2): 5 mg via INTRAVENOUS

## 2021-01-16 MED ORDER — LACTATED RINGERS IV SOLN: INTRAVENOUS | Status: DC

## 2021-01-16 MED ORDER — DEXAMETHASONE SODIUM PHOSPHATE 10 MG/ML IJ SOLN
INTRAMUSCULAR | Status: DC | PRN
  Administered 2021-01-16: 10 mg via INTRAVENOUS

## 2021-01-16 MED ORDER — OXYCODONE HCL 5 MG PO TABS: 5.0000 mg | ORAL_TABLET | Freq: Four times a day (QID) | ORAL | 0 refills | Status: AC | PRN

## 2021-01-16 MED ORDER — TRAMADOL HCL 50 MG PO TABS: 50.0000 mg | ORAL_TABLET | Freq: Four times a day (QID) | ORAL | 0 refills | Status: DC | PRN

## 2021-01-16 MED ORDER — LIDOCAINE 2% (20 MG/ML) 5 ML SYRINGE
INTRAMUSCULAR | Status: DC | PRN
  Administered 2021-01-16: 1.5 mg/kg/h via INTRAVENOUS

## 2021-01-16 MED ORDER — DEXAMETHASONE SODIUM PHOSPHATE 10 MG/ML IJ SOLN
INTRAMUSCULAR | Status: AC
  Filled 2021-01-16: qty 1

## 2021-01-16 MED ORDER — ORAL CARE MOUTH RINSE: 15.0000 mL | Freq: Once | OROMUCOSAL | Status: AC

## 2021-01-16 MED ORDER — AMISULPRIDE (ANTIEMETIC) 5 MG/2ML IV SOLN
INTRAVENOUS | Status: AC
  Filled 2021-01-16: qty 4

## 2021-01-16 MED ORDER — ENSURE PRE-SURGERY PO LIQD
592.0000 mL | Freq: Once | ORAL | Status: DC
  Filled 2021-01-16: qty 592

## 2021-01-16 MED ORDER — LACTATED RINGERS IR SOLN
Status: DC | PRN
  Administered 2021-01-16: 1000 mL

## 2021-01-16 MED ORDER — ENSURE PRE-SURGERY PO LIQD
296.0000 mL | Freq: Once | ORAL | Status: DC
  Filled 2021-01-16: qty 296

## 2021-01-16 MED ORDER — PROPOFOL 10 MG/ML IV BOLUS
INTRAVENOUS | Status: AC
  Filled 2021-01-16: qty 20

## 2021-01-16 MED ORDER — ACETAMINOPHEN 500 MG PO TABS
1000.0000 mg | ORAL_TABLET | Freq: Once | ORAL | Status: DC
  Filled 2021-01-16: qty 2

## 2021-01-16 MED ORDER — ENSURE SURGERY PO LIQD
237.0000 mL | Freq: Two times a day (BID) | ORAL | Status: DC
  Administered 2021-01-17 (×2): 237 mL via ORAL

## 2021-01-16 MED ORDER — ALVIMOPAN 12 MG PO CAPS
12.0000 mg | ORAL_CAPSULE | ORAL | Status: AC
  Administered 2021-01-16: 12 mg via ORAL
  Filled 2021-01-16: qty 1

## 2021-01-16 MED ORDER — HEPARIN SODIUM (PORCINE) 5000 UNIT/ML IJ SOLN
5000.0000 [IU] | Freq: Once | INTRAMUSCULAR | Status: AC
  Administered 2021-01-16: 5000 [IU] via SUBCUTANEOUS
  Filled 2021-01-16: qty 1

## 2021-01-16 MED ORDER — METRONIDAZOLE 500 MG PO TABS: 1000.0000 mg | ORAL_TABLET | ORAL | Status: DC

## 2021-01-16 MED ORDER — MIDAZOLAM HCL 5 MG/5ML IJ SOLN
INTRAMUSCULAR | Status: DC | PRN
  Administered 2021-01-16: 2 mg via INTRAVENOUS

## 2021-01-16 MED ORDER — HYDRALAZINE HCL 20 MG/ML IJ SOLN: 10.0000 mg | INTRAMUSCULAR | Status: DC | PRN

## 2021-01-16 MED ORDER — MIDAZOLAM HCL 2 MG/2ML IJ SOLN
INTRAMUSCULAR | Status: AC
  Filled 2021-01-16: qty 2

## 2021-01-16 MED ORDER — ACETAMINOPHEN 500 MG PO TABS
1000.0000 mg | ORAL_TABLET | Freq: Four times a day (QID) | ORAL | Status: DC
  Administered 2021-01-17 (×2): 1000 mg via ORAL
  Administered 2021-01-17 – 2021-01-18 (×2): 500 mg via ORAL
  Filled 2021-01-16 (×5): qty 2

## 2021-01-16 MED ORDER — 0.9 % SODIUM CHLORIDE (POUR BTL) OPTIME
TOPICAL | Status: DC | PRN
  Administered 2021-01-16: 2000 mL

## 2021-01-16 MED ORDER — PHENYLEPHRINE HCL-NACL 20-0.9 MG/250ML-% IV SOLN
INTRAVENOUS | Status: DC | PRN
  Administered 2021-01-16: 35 ug/min via INTRAVENOUS

## 2021-01-16 MED ORDER — FENTANYL CITRATE PF 50 MCG/ML IJ SOSY
25.0000 ug | PREFILLED_SYRINGE | INTRAMUSCULAR | Status: DC | PRN
  Administered 2021-01-16: 50 ug via INTRAVENOUS

## 2021-01-16 MED ORDER — CHLORHEXIDINE GLUCONATE 0.12 % MT SOLN
15.0000 mL | Freq: Once | OROMUCOSAL | Status: AC
  Administered 2021-01-16: 15 mL via OROMUCOSAL

## 2021-01-16 MED ORDER — FENTANYL CITRATE (PF) 250 MCG/5ML IJ SOLN
INTRAMUSCULAR | Status: DC | PRN
  Administered 2021-01-16 (×2): 50 ug via INTRAVENOUS
  Administered 2021-01-16: 100 ug via INTRAVENOUS
  Administered 2021-01-16: 50 ug via INTRAVENOUS

## 2021-01-16 MED ORDER — LIDOCAINE 2% (20 MG/ML) 5 ML SYRINGE
INTRAMUSCULAR | Status: DC | PRN
  Administered 2021-01-16: 100 mg via INTRAVENOUS

## 2021-01-16 MED ORDER — EPHEDRINE 5 MG/ML INJ
INTRAVENOUS | Status: AC
  Filled 2021-01-16: qty 5

## 2021-01-16 MED ORDER — DIPHENHYDRAMINE HCL 50 MG/ML IJ SOLN: 12.5000 mg | Freq: Four times a day (QID) | INTRAMUSCULAR | Status: DC | PRN

## 2021-01-16 MED ORDER — POLYETHYLENE GLYCOL 3350 17 GM/SCOOP PO POWD: 1.0000 | Freq: Once | ORAL | Status: DC

## 2021-01-16 MED ORDER — OXYCODONE HCL 5 MG/5ML PO SOLN: 5.0000 mg | Freq: Once | ORAL | Status: DC | PRN

## 2021-01-16 MED ORDER — ONDANSETRON HCL 4 MG/2ML IJ SOLN
4.0000 mg | Freq: Once | INTRAMUSCULAR | Status: AC | PRN
  Administered 2021-01-16: 4 mg via INTRAVENOUS

## 2021-01-16 MED ORDER — ROCURONIUM BROMIDE 10 MG/ML (PF) SYRINGE
PREFILLED_SYRINGE | INTRAVENOUS | Status: DC | PRN
  Administered 2021-01-16: 10 mg via INTRAVENOUS
  Administered 2021-01-16: 70 mg via INTRAVENOUS
  Administered 2021-01-16: 10 mg via INTRAVENOUS

## 2021-01-16 MED ORDER — LIDOCAINE HCL 2 % IJ SOLN
INTRAMUSCULAR | Status: AC
  Filled 2021-01-16: qty 40

## 2021-01-16 MED ORDER — SIMETHICONE 80 MG PO CHEW
40.0000 mg | CHEWABLE_TABLET | Freq: Four times a day (QID) | ORAL | Status: DC | PRN
  Filled 2021-01-16: qty 1

## 2021-01-16 MED ORDER — IBUPROFEN 400 MG PO TABS
600.0000 mg | ORAL_TABLET | Freq: Four times a day (QID) | ORAL | Status: DC | PRN
  Administered 2021-01-16: 600 mg via ORAL
  Filled 2021-01-16: qty 1

## 2021-01-16 MED ORDER — ROCURONIUM BROMIDE 10 MG/ML (PF) SYRINGE
PREFILLED_SYRINGE | INTRAVENOUS | Status: AC
  Filled 2021-01-16: qty 10

## 2021-01-16 MED ORDER — KETAMINE HCL 10 MG/ML IJ SOLN
INTRAMUSCULAR | Status: AC
  Filled 2021-01-16: qty 1

## 2021-01-16 MED ORDER — SODIUM CHLORIDE 0.9 % IV SOLN
2.0000 g | INTRAVENOUS | Status: AC
  Administered 2021-01-16: 2 g via INTRAVENOUS
  Filled 2021-01-16: qty 2

## 2021-01-16 MED ORDER — BUPIVACAINE LIPOSOME 1.3 % IJ SUSP
INTRAMUSCULAR | Status: DC | PRN
  Administered 2021-01-16: 20 mL

## 2021-01-16 MED ORDER — AMISULPRIDE (ANTIEMETIC) 5 MG/2ML IV SOLN
10.0000 mg | Freq: Once | INTRAVENOUS | Status: AC | PRN
  Administered 2021-01-16: 10 mg via INTRAVENOUS

## 2021-01-16 MED ORDER — VITAMIN D3 25 MCG (1000 UNIT) PO TABS
5000.0000 [IU] | ORAL_TABLET | ORAL | Status: DC
Start: 2021-01-16 — End: 2021-01-18
  Filled 2021-01-16: qty 5

## 2021-01-16 MED ORDER — NEOMYCIN SULFATE 500 MG PO TABS: 1000.0000 mg | ORAL_TABLET | ORAL | Status: DC

## 2021-01-16 MED ORDER — BUPIVACAINE LIPOSOME 1.3 % IJ SUSP
INTRAMUSCULAR | Status: AC
  Filled 2021-01-16: qty 20

## 2021-01-16 MED ORDER — LIDOCAINE HCL (PF) 2 % IJ SOLN
INTRAMUSCULAR | Status: AC
  Filled 2021-01-16: qty 5

## 2021-01-16 MED ORDER — BISACODYL 5 MG PO TBEC: 20.0000 mg | DELAYED_RELEASE_TABLET | Freq: Once | ORAL | Status: DC

## 2021-01-16 MED ORDER — AMLODIPINE BESYLATE 5 MG PO TABS
2.5000 mg | ORAL_TABLET | Freq: Every day | ORAL | Status: DC
  Administered 2021-01-17 – 2021-01-18 (×2): 2.5 mg via ORAL
  Filled 2021-01-16 (×2): qty 1

## 2021-01-16 MED ORDER — OXYCODONE HCL 5 MG PO TABS: 5.0000 mg | ORAL_TABLET | Freq: Once | ORAL | Status: DC | PRN

## 2021-01-16 MED ORDER — FENTANYL CITRATE (PF) 250 MCG/5ML IJ SOLN
INTRAMUSCULAR | Status: AC
  Filled 2021-01-16: qty 5

## 2021-01-16 MED ORDER — BUPIVACAINE-EPINEPHRINE (PF) 0.25% -1:200000 IJ SOLN
INTRAMUSCULAR | Status: DC | PRN
  Administered 2021-01-16: 30 mL

## 2021-01-16 SURGICAL SUPPLY — 124 items
ADH SKN CLS APL DERMABOND .7 (GAUZE/BANDAGES/DRESSINGS) ×2
APL PRP STRL LF DISP 70% ISPRP (MISCELLANEOUS) ×2
APPLIER CLIP 5 13 M/L LIGAMAX5 (MISCELLANEOUS)
APPLIER CLIP ROT 10 11.4 M/L (STAPLE)
APR CLP MED LRG 11.4X10 (STAPLE)
APR CLP MED LRG 5 ANG JAW (MISCELLANEOUS)
BAG COUNTER SPONGE SURGICOUNT (BAG) ×1 IMPLANT
BAG SPNG CNTER NS LX DISP (BAG) ×2
BLADE EXTENDED COATED 6.5IN (ELECTRODE) ×3 IMPLANT
CANNULA REDUC XI 12-8 STAPL (CANNULA)
CANNULA REDUCER 12-8 DVNC XI (CANNULA) ×2 IMPLANT
CELLS DAT CNTRL 66122 CELL SVR (MISCELLANEOUS) IMPLANT
CHLORAPREP W/TINT 26 (MISCELLANEOUS) ×3 IMPLANT
CLIP APPLIE 5 13 M/L LIGAMAX5 (MISCELLANEOUS) IMPLANT
CLIP APPLIE ROT 10 11.4 M/L (STAPLE) IMPLANT
CLIP LIGATING HEMO O LOK GREEN (MISCELLANEOUS) IMPLANT
COVER SURGICAL LIGHT HANDLE (MISCELLANEOUS) ×6 IMPLANT
COVER TIP SHEARS 8 DVNC (MISCELLANEOUS) ×2 IMPLANT
COVER TIP SHEARS 8MM DA VINCI (MISCELLANEOUS) ×3
DECANTER SPIKE VIAL GLASS SM (MISCELLANEOUS) ×2 IMPLANT
DEFOGGER SCOPE WARMER CLEARIFY (MISCELLANEOUS) ×2 IMPLANT
DERMABOND ADVANCED (GAUZE/BANDAGES/DRESSINGS) ×1
DERMABOND ADVANCED .7 DNX12 (GAUZE/BANDAGES/DRESSINGS) IMPLANT
DEVICE TROCAR PUNCTURE CLOSURE (ENDOMECHANICALS) IMPLANT
DRAIN CHANNEL 19F RND (DRAIN) ×3 IMPLANT
DRAPE ARM DVNC X/XI (DISPOSABLE) ×8 IMPLANT
DRAPE COLUMN DVNC XI (DISPOSABLE) ×2 IMPLANT
DRAPE DA VINCI XI ARM (DISPOSABLE) ×12
DRAPE DA VINCI XI COLUMN (DISPOSABLE) ×3
DRAPE SURG IRRIG POUCH 19X23 (DRAPES) ×2 IMPLANT
DRSG OPSITE POSTOP 4X10 (GAUZE/BANDAGES/DRESSINGS) IMPLANT
DRSG OPSITE POSTOP 4X6 (GAUZE/BANDAGES/DRESSINGS) IMPLANT
DRSG OPSITE POSTOP 4X8 (GAUZE/BANDAGES/DRESSINGS) IMPLANT
DRSG TEGADERM 2-3/8X2-3/4 SM (GAUZE/BANDAGES/DRESSINGS) ×10 IMPLANT
DRSG TEGADERM 4X4.75 (GAUZE/BANDAGES/DRESSINGS) ×2 IMPLANT
ELECT REM PT RETURN 15FT ADLT (MISCELLANEOUS) ×3 IMPLANT
ENDOLOOP SUT PDS II  0 18 (SUTURE)
ENDOLOOP SUT PDS II 0 18 (SUTURE) IMPLANT
EVACUATOR SILICONE 100CC (DRAIN) ×3 IMPLANT
GAUZE SPONGE 2X2 8PLY STRL LF (GAUZE/BANDAGES/DRESSINGS) ×2 IMPLANT
GAUZE SPONGE 4X4 12PLY STRL (GAUZE/BANDAGES/DRESSINGS) ×1 IMPLANT
GLOVE SURG ENC MOIS LTX SZ7.5 (GLOVE) ×9 IMPLANT
GLOVE SURG UNDER LTX SZ8 (GLOVE) ×9 IMPLANT
GOWN STRL REUS W/TWL XL LVL3 (GOWN DISPOSABLE) ×17 IMPLANT
GRASPER SUT TROCAR 14GX15 (MISCELLANEOUS) IMPLANT
HOLDER FOLEY CATH W/STRAP (MISCELLANEOUS) ×3 IMPLANT
IRRIG SUCT STRYKERFLOW 2 WTIP (MISCELLANEOUS) ×3
IRRIGATION SUCT STRKRFLW 2 WTP (MISCELLANEOUS) ×2 IMPLANT
KIT PROCEDURE DA VINCI SI (MISCELLANEOUS)
KIT PROCEDURE DVNC SI (MISCELLANEOUS) IMPLANT
KIT TURNOVER KIT A (KITS) IMPLANT
NDL INSUFFLATION 14GA 120MM (NEEDLE) ×2 IMPLANT
NEEDLE INSUFFLATION 14GA 120MM (NEEDLE) ×3 IMPLANT
PACK CARDIOVASCULAR III (CUSTOM PROCEDURE TRAY) ×3 IMPLANT
PACK COLON (CUSTOM PROCEDURE TRAY) ×3 IMPLANT
PAD POSITIONING PINK XL (MISCELLANEOUS) ×3 IMPLANT
PENCIL SMOKE EVACUATOR (MISCELLANEOUS) IMPLANT
PROTECTOR NERVE ULNAR (MISCELLANEOUS) ×6 IMPLANT
RELOAD STAPLE 45 3.5 BLU DVNC (STAPLE) IMPLANT
RELOAD STAPLE 45 4.3 GRN DVNC (STAPLE) IMPLANT
RELOAD STAPLE 60 3.5 BLU DVNC (STAPLE) IMPLANT
RELOAD STAPLE 60 4.1 GRN THCK (STAPLE) IMPLANT
RELOAD STAPLE 60 4.3 GRN DVNC (STAPLE) IMPLANT
RELOAD STAPLER 3.5X45 BLU DVNC (STAPLE) IMPLANT
RELOAD STAPLER 3.5X60 BLU DVNC (STAPLE) IMPLANT
RELOAD STAPLER 4.3X45 GRN DVNC (STAPLE) IMPLANT
RELOAD STAPLER 4.3X60 GRN DVNC (STAPLE) IMPLANT
RELOAD STAPLER GREEN 60MM (STAPLE) ×2 IMPLANT
RETRACTOR WND ALEXIS 18 MED (MISCELLANEOUS) IMPLANT
RTRCTR WOUND ALEXIS 18CM MED (MISCELLANEOUS)
SCISSORS LAP 5X35 DISP (ENDOMECHANICALS) ×1 IMPLANT
SEAL CANN UNIV 5-8 DVNC XI (MISCELLANEOUS) ×8 IMPLANT
SEAL XI 5MM-8MM UNIVERSAL (MISCELLANEOUS) ×12
SEALER VESSEL DA VINCI XI (MISCELLANEOUS) ×3
SEALER VESSEL EXT DVNC XI (MISCELLANEOUS) ×2 IMPLANT
SIGMOIDOSCOPE DISPOSABLE (MISCELLANEOUS) ×1 IMPLANT
SLEEVE ADV FIXATION 5X100MM (TROCAR) IMPLANT
SOLUTION ELECTROLUBE (MISCELLANEOUS) ×3 IMPLANT
SPONGE GAUZE 2X2 STER 10/PKG (GAUZE/BANDAGES/DRESSINGS)
STAPLE ECHEON FLEX 60 POW ENDO (STAPLE) ×2 IMPLANT
STAPLER CANNULA SEAL DVNC XI (STAPLE) ×2 IMPLANT
STAPLER CANNULA SEAL XI (STAPLE) ×3
STAPLER ECHELON POWER CIR 29 (STAPLE) ×1 IMPLANT
STAPLER ECHELON POWER CIR 31 (STAPLE) IMPLANT
STAPLER RELOAD 3.5X45 BLU DVNC (STAPLE)
STAPLER RELOAD 3.5X45 BLUE (STAPLE)
STAPLER RELOAD 3.5X60 BLU DVNC (STAPLE)
STAPLER RELOAD 3.5X60 BLUE (STAPLE)
STAPLER RELOAD 4.3X45 GREEN (STAPLE)
STAPLER RELOAD 4.3X45 GRN DVNC (STAPLE)
STAPLER RELOAD 4.3X60 GREEN (STAPLE)
STAPLER RELOAD 4.3X60 GRN DVNC (STAPLE)
STAPLER RELOAD GREEN 60MM (STAPLE) ×3
STOPCOCK 4 WAY LG BORE MALE ST (IV SETS) ×6 IMPLANT
SURGILUBE 2OZ TUBE FLIPTOP (MISCELLANEOUS) ×3 IMPLANT
SUT MNCRL AB 4-0 PS2 18 (SUTURE) ×3 IMPLANT
SUT PDS AB 1 CT1 27 (SUTURE) ×2 IMPLANT
SUT PDS AB 1 TP1 96 (SUTURE) IMPLANT
SUT PROLENE 0 CT 2 (SUTURE) IMPLANT
SUT PROLENE 2 0 KS (SUTURE) ×3 IMPLANT
SUT PROLENE 2 0 SH DA (SUTURE) IMPLANT
SUT SILK 2 0 (SUTURE) ×3
SUT SILK 2 0 SH CR/8 (SUTURE) ×1 IMPLANT
SUT SILK 2-0 18XBRD TIE 12 (SUTURE) IMPLANT
SUT SILK 3 0 (SUTURE) ×3
SUT SILK 3 0 SH CR/8 (SUTURE) ×3 IMPLANT
SUT SILK 3-0 18XBRD TIE 12 (SUTURE) ×2 IMPLANT
SUT V-LOC BARB 180 2/0GR6 GS22 (SUTURE)
SUT VIC AB 3-0 SH 18 (SUTURE) IMPLANT
SUT VIC AB 3-0 SH 27 (SUTURE)
SUT VIC AB 3-0 SH 27XBRD (SUTURE) IMPLANT
SUT VICRYL 0 UR6 27IN ABS (SUTURE) ×3 IMPLANT
SUTURE V-LC BRB 180 2/0GR6GS22 (SUTURE) IMPLANT
SYR 10ML LL (SYRINGE) ×3 IMPLANT
SYS LAPSCP GELPORT 120MM (MISCELLANEOUS)
SYS WOUND ALEXIS 18CM MED (MISCELLANEOUS) ×3
SYSTEM LAPSCP GELPORT 120MM (MISCELLANEOUS) IMPLANT
SYSTEM WOUND ALEXIS 18CM MED (MISCELLANEOUS) ×2 IMPLANT
TAPE UMBILICAL 1/8 X36 TWILL (MISCELLANEOUS) ×2 IMPLANT
TOWEL OR NON WOVEN STRL DISP B (DISPOSABLE) ×3 IMPLANT
TROCAR ADV FIXATION 5X100MM (TROCAR) ×3 IMPLANT
TROCAR BLADELESS OPT 5 100 (ENDOMECHANICALS) ×1 IMPLANT
TUBING CONNECTING 10 (TUBING) ×7 IMPLANT
TUBING INSUFFLATION 10FT LAP (TUBING) ×3 IMPLANT

## 2021-01-16 NOTE — H&P (Signed)
CC: Here today for surgery   HPI: [name] is an 70 y.o. female with history of HTN, HLD, whom was taken to OR emergently 06/20/20 - exlap/sigmoidectomy/takedown of splenic flexure for perforated diverticulitis. Pathology - diverticulitis with perforation, pericolonic abscess, acute serositis.  Benign omentum.  She was admitted postoperatively where she recovered well.  She was discharged home on POD#5.  Home health assisted in her care. She denies fever/chills/nausea/vomiting.  Colonoscopy 12/01/2018 with Dr. Fuller Plan showed diverticulosis in left colon without any other abnormalities.  Last seen in our office 07/15/20 - she returns today to inquire about possible colostomy reversal.  She has been doing well. She denies any complaints aside from the inconveniences associated with her colostomy. He has been working well. Her appetite has been good. She denies any issues with nausea/vomiting/abdominal pain. Weight stable. Highly motivated to have her colostomy reversed.  She denies any changes in her health or health history since we met in the office in September. States she is ready for surgery.  PMH: HTN, HLD  PSH: as above  FHx: Denies any known family history of colorectal, breast, endometrial or ovarian cancer  Social Hx: Denies use of tobacco/EtOH/illicit drug. She lives at home with her husband  Review of Systems: A complete review of systems was obtained from the patient. I have reviewed this information and discussed as appropriate with the patient. See HPI as well for other ROS.  All of the below systems have been reviewed with the patient and positives are indicated with bold text General: chills, fever or night sweats Eyes: blurry vision or double vision ENT: epistaxis or sore throat Allergy/Immunology: itchy/watery eyes or nasal congestion Hematologic/Lymphatic: bleeding problems, blood clots or swollen lymph nodes Endocrine: temperature intolerance or unexpected  weight changes Breast: new or changing breast lumps or nipple discharge Resp: cough, shortness of breath, or wheezing CV: chest pain or dyspnea on exertion GI: as per HPI GU: dysuria, trouble voiding, or hematuria MSK: joint pain or joint stiffness Neuro: TIA or stroke symptoms Derm: pruritus and skin lesion changes Psych: anxiety and depression  Medical History: Past Medical History:  Diagnosis Date   Arthritis   History of cancer   Hypertension   There is no problem list on file for this patient.  Past Surgical History:  Procedure Laterality Date   colon rectal surgery N/A   COLON SURGERY   hysterectomy surgery N/A   MASTECTOMY    Allergies  Allergen Reactions   Pregabalin Other (See Comments)  Extreme dry mouth, dizziness   Tramadol Nausea And Vomiting, Hives and Vomiting  Dizziness   Current Outpatient Medications on File Prior to Visit  Medication Sig Dispense Refill   amLODIPine (NORVASC) 2.5 MG tablet amlodipine 2.5 mg tablet TAKE 1 TABLET BY MOUTH EVERY DAY   krill-om3-dha-epa-om6-lip-astx (KRILL OIL, OMEGA 3 AND 6,) 1000-130(40-80) mg Cap Take by mouth   meloxicam (MOBIC) 15 MG tablet Take 15 mg by mouth once daily   meloxicam (MOBIC) 15 MG tablet meloxicam 15 mg tablet TAKE 1 TABLET BY MOUTH EVERY DAY   multivitamin tablet Take 1 tablet by mouth once daily   No current facility-administered medications on file prior to visit.   Family History  Problem Relation Age of Onset   High blood pressure (Hypertension) Mother   Diabetes Mother   Diabetes Sister   Hyperlipidemia (Elevated cholesterol) Brother   Diabetes Brother    Social History   Tobacco Use  Smoking Status Former Smoker   Quit  date: 63   Years since quitting: 47.7  Smokeless Tobacco Never Used    Social History   Socioeconomic History   Marital status: Married  Tobacco Use   Smoking status: Former Smoker  Quit date: 1975  Years since quitting: 47.7   Smokeless tobacco:  Never Used  Scientific laboratory technician Use: Never used  Substance and Sexual Activity   Alcohol use: Not Currently   Drug use: Never   Objective:   There were no vitals filed for this visit.  There is no height or weight on file to calculate BMI.  Constitutional: NAD; conversant; no deformities; wearing mask Eyes: Moist conjunctiva; no lid lag; anicteric Neck: Trachea midline; no thyromegaly Lungs: Normal respiratory effort; no tactile fremitus CV: RRR; no palpable thrills; no pitting edema GI: Abdomen is soft, NT/ND; no palpable hepatosplenomegaly. Colostomy pink, productive. MSK: Normal range of motion of extremities; no clubbing/cyanosis Psychiatric: Appropriate affect; alert and oriented x3 Lymphatic: No palpable cervical or axillary lymphadenopathy  Labs, Imaging and Diagnostic Testing: I have personally reviewed the relevant labs  Assessment and Plan:  There are no diagnoses linked to this encounter.   [name] is a very pleasant 70 y.o. female with hx of HTN, HLD, breast ca - now s/p elap/hartmann's with takedown of splenic flexure 06/20/20 -GGE clear  -The anatomy and physiology of the GI tract was reviewed with the patient particularly as it pertains to her with her colostomy -She understands that this is a "elective" procedure from the perspective that this is done for quality-of-life reasons. She is highly motivated to have her colostomy reversed. -We discussed robotic versus open takedown of colostomy with colorectal anastomosis. Lysis of adhesions. Flexible sigmoidoscopy. Possible segmental/partial colectomy. -The planned procedure, material risks (including, but not limited to, pain, bleeding, infection, scarring, need for blood transfusion, damage to surrounding structures- blood vessels/nerves/viscus/organs, damage to ureter, urine leak, leak from anastomosis, need for additional procedures, sexual dysfunction, scenarios where another stoma may be necessary and  where it could be permanent, worsening of pre-existing medical conditions, hernia, recurrence, pneumonia, heart attack, stroke, death) benefits and alternatives to surgery were discussed at length. The patient's questions were answered to her satisfaction, she voiced understanding and elected to proceed with surgery. Additionally, we discussed typical postoperative expectations and the recovery process.  Return for postop appointment 2-3 weeks after your surgery.  I spent a total of 45 minutes in both face-to-face and non-face-to-face activities for this visit on the date of this encounter.  Nadeen Landau, MD [facility], [facility]

## 2021-01-16 NOTE — Anesthesia Postprocedure Evaluation (Signed)
Anesthesia Post Note  Patient: [name]  Procedure(s) Performed: XI ROBOTIC LOW ANTERIOR ASSISTED COLOSTOMY TAKEDOWN (Abdomen) LYSIS OF ADHESION POSSIBLE SEGEMNTAL/PARTIAL COLECTOMY     Patient location during evaluation: PACU Anesthesia Type: General Level of consciousness: awake and alert Pain management: pain level controlled Vital Signs Assessment: post-procedure vital signs reviewed and stable Respiratory status: spontaneous breathing, nonlabored ventilation and respiratory function stable Cardiovascular status: blood pressure returned to baseline and stable Postop Assessment: no apparent nausea or vomiting Anesthetic complications: no   No notable events documented.  Last Vitals:  Vitals:   01/16/21 1245 01/16/21 1300  BP: 128/84 123/84  Pulse: 82 74  Resp: 14 11  Temp:    SpO2: 100% 100%    Last Pain:  Vitals:   01/16/21 1200  TempSrc:   PainSc: 0-No pain                 Lidia Collum

## 2021-01-16 NOTE — Discharge Instructions (Signed)
POST OP INSTRUCTIONS AFTER COLON SURGERY  DIET: Be sure to include lots of fluids daily to stay hydrated - 64oz of water per day (8, 8 oz glasses).  Avoid fast food or heavy meals for the first couple of weeks as your are more likely to get nauseated. Avoid raw/uncooked fruits or vegetables for the first 4 weeks (its ok to have these if they are blended into smoothie form). If you have fruits/vegetables, make sure they are cooked until soft enough to mash on the roof of your mouth and chew your food well. Otherwise, diet as tolerated.  Take your usually prescribed home medications unless otherwise directed.  PAIN CONTROL: Pain is best controlled by a usual combination of three different methods TOGETHER: Ice/Heat Over the counter pain medication Prescription pain medication Most patients will experience some swelling and bruising around the surgical site.  Ice packs or heating pads (30-60 minutes up to 6 times a day) will help. Some people prefer to use ice alone, heat alone, alternating between ice & heat.  Experiment to what works for you.  Swelling and bruising can take several weeks to resolve.   It is helpful to take an over-the-counter pain medication regularly for the first few weeks: Ibuprofen (Motrin/Advil) - 200mg  tabs - take 3 tabs (600mg ) every 6 hours as needed for pain (unless you have been directed previously to avoid NSAIDs/ibuprofen) Acetaminophen (Tylenol) - you may take 650mg  every 6 hours as needed. You can take this with motrin as they act differently on the body. If you are taking a narcotic pain medication that has acetaminophen in it, do not take over the counter tylenol at the same time. NOTE: You may take both of these medications together - most patients find it most helpful when alternating between the two (i.e. Ibuprofen at 6am, tylenol at 9am, ibuprofen at 12pm ..Marland Kitchen) A  prescription for pain medication should be given to you upon discharge.  Take your pain medication as  prescribed if your pain is not adequatly controlled with the over-the-counter pain reliefs mentioned above.  Avoid getting constipated.  Between the surgery and the pain medications, it is not uncommon to experience some constipation.  Increasing fluid intake and taking a fiber supplement (such as Metamucil, Citrucel, FiberCon, MiraLax, etc) 1-2 times a day regularly will usually help prevent this problem from occurring.  A mild laxative (prune juice, Milk of Magnesia, MiraLax, etc) should be taken according to package directions if there are no bowel movements after 48 hours.    Dressing: Your prior colostomy site may be dressed with some dry gauze to protect your clothing and changed 1-2x/day. You do not need to 'pack' the wound. The muscle beneath is closed but the skin is open. This will heal over in the coming couple of weeks. It is ok to get this wet in the shower with soap/water over this wound and actually helps keep it clean. Your incisions are covered in Dermabond which is like sterile superglue for the skin. This will come off on it's own in a couple weeks. It is waterproof and you may bathe normally starting the day after your surgery in a shower. Avoid baths/pools/lakes/oceans until your wounds have fully healed.  ACTIVITIES as tolerated:   Avoid heavy lifting (>10lbs or 1 gallon of milk) for the next 6 weeks. You may resume regular daily activities as tolerated--such as daily self-care, walking, climbing stairs--gradually increasing activities as tolerated.  If you can walk 30 minutes without difficulty, it is  safe to try more intense activity such as jogging, treadmill, bicycling, low-impact aerobics.  DO NOT PUSH THROUGH PAIN.  Let pain be your guide: If it hurts to do something, don't do it. You may drive when you are no longer taking prescription pain medication, you can comfortably wear a seatbelt, and you can safely maneuver your car and apply brakes.  FOLLOW UP in our office Please  call [facility] at [phone] to set up an appointment to see your surgeon in the office for a follow-up appointment approximately 2 weeks after your surgery. Make sure that you call for this appointment the day you arrive home to insure a convenient appointment time.  9. If you have disability or family leave forms that need to be completed, you may have them completed by your primary care physician's office; for return to work instructions, please ask our office staff and they will be happy to assist you in obtaining this documentation   When to call us [phone]: Poor pain control Reactions / problems with new medications (rash/itching, etc)  Fever over 101.5 F (38.5 C) Inability to urinate Nausea/vomiting Worsening swelling or bruising Continued bleeding from incision. Increased pain, redness, or drainage from the incision  The clinic staff is available to answer your questions during regular business hours (8:30am-5pm).  Please don't hesitate to call and ask to speak to one of our nurses for clinical concerns.   A surgeon from [facility] is always on call at the hospitals   If you have a medical emergency, go to the nearest emergency room or call 911.  [facility], [place] [address] [place], [place], [place]  [address] MAIN: [phone] FAX: [phone] [email or web address]

## 2021-01-16 NOTE — Anesthesia Procedure Notes (Signed)
Procedure Name: Intubation Date/Time: 01/16/2021 7:49 AM Performed by: Maxwell Caul, CRNA Pre-anesthesia Checklist: Patient identified, Emergency Drugs available, Suction available and Patient being monitored Patient Re-evaluated:Patient Re-evaluated prior to induction Oxygen Delivery Method: Circle system utilized Preoxygenation: Pre-oxygenation with 100% oxygen Induction Type: IV induction Ventilation: Mask ventilation without difficulty Laryngoscope Size: Mac and 4 Grade View: Grade I Tube type: Oral Tube size: 7.0 mm Number of attempts: 1 Airway Equipment and Method: Stylet Placement Confirmation: ETT inserted through vocal cords under direct vision, positive ETCO2 and breath sounds checked- equal and bilateral Secured at: 21 cm Tube secured with: Tape Dental Injury: Teeth and Oropharynx as per pre-operative assessment

## 2021-01-16 NOTE — Op Note (Signed)
PATIENT: [name]  70 y.o. female  Patient Care Team: Plotnikov, Evie Lacks, MD as PCP - General Everlene Farrier, MD as Attending Physician (Obstetrics and Gynecology) Ladene Artist, MD (Gastroenterology) Mauro Kaufmann, RN as Oncology Nurse Navigator Rockwell Germany, RN as Oncology Nurse Navigator Erroll Luna, MD as Consulting Physician (General Surgery) Magrinat, Virgie Dad, MD as Consulting Physician (Oncology) Kyung Rudd, MD as Consulting Physician (Radiation Oncology)  PREOP DIAGNOSIS: COLOSTOMY STATUS; HX OF DIVERTICULITIS  POSTOP DIAGNOSIS: COLOSTOMY STATUS; HX OF DIVERTICULITIS  PROCEDURE:  Robotic assisted low anterior resection Takedown of colostomy Robotic lysis of adhesions x 70 minutes Diagnostic flexible sigmoidoscopy - necessary to evaluate an apparent rectosigmoid stricture Bilateral transversus abdominus plane blocks  SURGEON: Sharon Mt. Dema Severin, MD  ASSISTANT: Leighton Ruff, MD  ANESTHESIA: General endotracheal  EBL: 50 mL Total I/O In: 1100 [I.V.:1000; IV Piggyback:100] Out: 450 [Urine:400; Blood:50]  DRAINS: None  SPECIMEN:  Remnant sigmoid Rectosigmoid colon Colostomy  COUNTS: Sponge, needle and instrument counts were reported correct x2  FINDINGS:  Small bowel adhesions to the midline which required adhesiolysis to facilitate the procedure.  There are also small bowel adhesions to the remnant sigmoid and to her omentum in the left upper quadrant.  All these were taken down.  Small bowel adhesions to the cut edge of her mesosigmoid were also present.  She has a tight intrinsic type stenosis in the pelvis that appear to be distal to her rectosigmoid colon.  A flexible sigmoidoscopy was therefore carried out to evaluate this area.  There is no evident neoplasia.  There is a tight narrowing of the lumen at this location and ultimately able to pass with the sigmoidoscope.  In order to facilitate colostomy takedown and anastomosis, a low  anterior resection was carried out with a double stapled colorectal anastomosis fashioned at 11 cm from the anal verge by flexible sigmoidoscopy.  This is tension-free, hemostatic, airtight, and well perfused.  NARRATIVE: Informed consent was verified. He was taken to the operating room, placed supine on the operating table and SCD's were applied. General endotracheal anesthesia was induced without difficulty. She was then positioned in the lithotomy position with Allen stirrups. A foley catheter was then placed by nursing under sterile conditions. Hair on the abdomen was clipped.  She was secured to the operating table with arms tucked. Pressure points were evaluated and padded. The abdomen was then prepped and draped in the standard sterile fashion. Surgical timeout was called indicating the correct patient, procedure, positioning and need for preoperative antibiotics.   An OG tube was placed by anesthesia and confirmed to be to suction.  At Palmer's point, a stab incision was created and the Veress needle was introduced into the peritoneal cavity on the first attempt.  Intraperitoneal location was confirmed by the aspiration and saline drop test.  Pneumoperitoneum was established to a maximum pressure of 15 mmHg using CO2.  Following this, the abdomen was marked for planned trocar sites.  Just to the right and cephalad to the umbilicus, an 8 mm incision was created and an 8 mm blunt tipped robotic trocar was cautiously placed into the peritoneal cavity.  The laparoscope was inserted and demonstrated no evidence of trocar site nor Veress needle site complications.  The Veress needle was removed.  Bilateral transversus abdominis plane blocks were then created using a dilute mixture of Exparel with Marcaine.  3 additional 8 mm robotic trochars were placed under direct visualization roughly in a line extending from the right ASIS  towards the left upper quadrant. An additional 5 mm assist port was placed in the  right lateral abdomen under direct visualization.  The abdomen was surveyed and there are adhesions of small bowel to the low midline and within the pelvis. She was positioned in Trendelenburg with some left side up.  Small bowel was carefully retracted out of the pelvis.  The robot was then docked and I went to the console.  Beginning with adhesiolysis, robotic scissors were used to carefully lyse adhesions of small bowel to the midline.  Were ultimately able to free all of these.  There are also adhesions of the small bowel to the sigmoid stump in the pelvis.  These were carefully lysed sharply.  Small bowel adhesions extended up and around her colostomy towards her left paracolic gutter.  These were also taken down sharply.  Finally, there were small bowel to mesosigmoid adhesions that were carefully taken down sharply.  Attention was directed at the colostomy.  Omental adhesions were taken down from around the colostomy sharply.  There were also adhesions of the descending colon to the paracolic region that were lysed.  At this point there was more than adequate mobilization of the descending colon for it to reach into the deep pelvis without difficulty.  Attention was then directed at the sigmoid stump.  This was readily identified in the pelvis.  This was elevated.  It was apparent that there was a small amount of remaining sigmoid colon that would need to be removed to facilitate the procedure.  The left and right ureters were identified.  With the sigmoid colon elevated, the associated mesocolon was mobilized from the midline over the sacral promontory.  A window was created in the mesentery where there were no appendices epiploica and at the location that appears to be the proximalmost aspect of the rectum.  The intervening mesentery was then divided using the vessel sealer.  The mesentery is inspected and noted to be hemostatic.  The robot was then undocked and I scrubbed back in.  Attention was  directed at taking on the colostomy.  The colostomy is incised circumferentially and the underlying subcutaneous tissue dissected free.  Attachments to the rectus sheath were carefully mobilized.  At this point, the entire colostomy is free.  Colostomy is inspected and there were no mesenteric injuries and well perfused in appearance.  Just proximal to the actual colostomy portion, 1 discrete the mesentery.  A pursestring device was applied.  A 2-0 Prolene on a Keith needle was passed.  The colostomy is removed sharply and passed off the specimen.  Belt loops consisting of 3-0 silk were placed around the pursestring suture line.  There is a palpable pulse in the mesentery going out to this location.  EEA sizers were passed and a 29 mm stapler was selected.  The anvil was placed and the pursestring tied.  There is no significant amount of fat at the planned location anastomosis.  An Alexis wound protector was placed in the colon with the anvil was placed back into the abdomen.  A wound protector cap was placed.  Pneumoperitoneum was reestablished.  The cleared area of the mesentery on the rectosigmoid junction is identified.  Using a laparoscopic powered Echelon green load stapler, this is divided with a single firing.  This is removed and passed off as remnant sigmoid as specimen.   I then went below.  EEA sizers were carefully passed under direct visualization into the anal canal.  It became apparent  that there is a intrinsic appearing narrowing within her pelvis and I am unable to pass a 25 mm dilator.  Therefore, we opted to proceed with a flexible sigmoidoscopy.  A flexible sigmoidoscope was introduced through the anal canal.  Under direct visualization, the rectum was inspected as the scope was advanced.  I am able to pass this apparent point of narrowing and there is no apparent mucosal abnormalities at this location aside from some intrinsic narrowing of the actual proximalmost rectum.  It was clear that we  would need to go further down in our pelvic dissection to facilitate a anastomosis.  The robot was then redocked.  I went back to the console.  The rectosigmoid colon was elevated anteriorly.  The left and right ureters were again identified.  Staying within the midline of the pelvis, the plane between the fascia propria the rectum and the presacral fascia was identified.  This plane was developed into the mid/deep pelvis.  We were clearly working on the proximal portion of the rectum.  We went down to the point of apparent narrowing and just beyond it.  The associated mesentery was cleared out to this level.  This was done using the vessel sealer.  This all was then inspected and noted to be hemostatic.  Through our ostomy site, a 12 mm trocar was placed.  An additional firing of the green load of our Echelon stapler was then passed under direct visualization.  The proximal rectum was then divided out to our cleared area of mesentery using the stapler.  This was done with a single firing.  This was passed off as additional specimen-rectosigmoid colon  I then remained to the console my partner went below.  She was able to carefully pass a 29 mm EEA sizer under direct visualization without any difficulty.  The stapler was then passed transanally under direct visualization.  The spike was deployed on the anterior wall of the rectum.  The components were then mated.  Orientation was confirmed such that there is no twisting or small bowel underneath the cut edge of the mesentery of the descending colon.  The stapler was then closed, held, and fired.  The colon proximally anastomosis is gently occluded.  Irrigation is then used to fill the pelvis.  My partner passed the flexible sigmoidoscope under direct visualization while I remain to the console watching the anastomosis.  With the pelvis filled with sterile saline, the anastomosis is visualized and airtight.  There is no tension on the colon.  Looking  endoscopically, the anastomosis is hemostatic.  Pneumorectum is evacuated.  We scrubbed back in.  Irrigation is evacuated.  Pelvis is inspected and hemostatic.  The robot was then undocked.  Trochars were removed under direct visualization and hemostatic.  The wound protector was removed.    Gowns/gloves are changed and a fresh set of clean instruments utilized. Additional sterile drapes were placed around the field.   Sponge, needle, and instrument counts were reported correct. The rectus fascia at the colostomy site was then closed using 2 running #1 PDS sutures longitudinally.  The fascia was then palpated and noted to be completely closed.  Additional anesthetic was infiltrated at this site.  Sponge, needle, and instrument counts were then again reported correct. 4-0 Monocryl subcuticular suture was used to close the skin of all incision sites.  Dermabond was placed over all incisions.   The colostomy site was then partially closed using a 0 Vicryl pursestring suture.  This was then wiped  with a piece of Telfa and covered with 4 x 4's followed by tape.   She is then taken out of lithotomy, awakened from anesthesia, extubated, and transferred to a stretcher for transport to PACU in satisfactory condition having tolerated the procedure well.

## 2021-01-16 NOTE — Transfer of Care (Signed)
Immediate Anesthesia Transfer of Care Note  Patient: [name]  Procedure(s) Performed: XI ROBOTIC LOW ANTERIOR ASSISTED COLOSTOMY TAKEDOWN (Abdomen) LYSIS OF ADHESION POSSIBLE SEGEMNTAL/PARTIAL COLECTOMY  Patient Location: PACU  Anesthesia Type:General  Level of Consciousness: awake and alert   Airway & Oxygen Therapy: Patient Spontanous Breathing and Patient connected to face mask oxygen  Post-op Assessment: Report given to RN and Post -op Vital signs reviewed and stable  Post vital signs: Reviewed and stable  Last Vitals:  Vitals Value Taken Time  BP    Temp    Pulse    Resp    SpO2      Last Pain:  Vitals:   01/16/21 0626  TempSrc: Oral  PainSc:          Complications: No notable events documented.

## 2021-01-17 ENCOUNTER — Encounter (HOSPITAL_COMMUNITY): Payer: Self-pay | Admitting: Surgery

## 2021-01-17 LAB — TYPE AND SCREEN
ABO/RH(D): O POS
Antibody Screen: NEGATIVE

## 2021-01-17 LAB — BASIC METABOLIC PANEL
Anion gap: 7 (ref 5–15)
BUN: 10 mg/dL (ref 8–23)
CO2: 28 mmol/L (ref 22–32)
Calcium: 8.4 mg/dL — ABNORMAL LOW (ref 8.9–10.3)
Chloride: 101 mmol/L (ref 98–111)
Creatinine, Ser: 0.73 mg/dL (ref 0.44–1.00)
GFR, Estimated: >60 mL/min (ref >60)
Glucose, Bld: 114 mg/dL — ABNORMAL HIGH (ref 70–99)
Potassium: 3.1 mmol/L — ABNORMAL LOW (ref 3.5–5.1)
Sodium: 136 mmol/L (ref 135–145)

## 2021-01-17 LAB — CBC
HCT: 31.6 % — ABNORMAL LOW (ref 36.0–46.0)
Hemoglobin: 10.1 g/dL — ABNORMAL LOW (ref 12.0–15.0)
MCH: 27.3 pg (ref 26.0–34.0)
MCHC: 32 g/dL (ref 30.0–36.0)
MCV: 85.4 fL (ref 80.0–100.0)
Platelets: 231 10*3/uL (ref 150–400)
RBC: 3.7 MIL/uL — ABNORMAL LOW (ref 3.87–5.11)
RDW: 15 % (ref 11.5–15.5)
WBC: 8.2 10*3/uL (ref 4.0–10.5)
nRBC: 0 % (ref 0.0–0.2)

## 2021-01-17 MED ORDER — POTASSIUM CHLORIDE 20 MEQ PO PACK
40.0000 meq | PACK | Freq: Once | ORAL | Status: AC
  Administered 2021-01-17: 40 meq via ORAL
  Filled 2021-01-17: qty 2

## 2021-01-17 NOTE — Progress Notes (Signed)
PT Cancellation Note  Patient Details Name: [name] MRN: [id number] DOB: 11-24-1950   Cancelled Treatment:    Reason Eval/Treat Not Completed: PT screened, no needs identified, will sign off    Doreatha Massed, PT Acute Rehabilitation  Office: [phone] Pager: [phone]

## 2021-01-17 NOTE — Progress Notes (Signed)
1 Day Post-Op   Subjective/Chief Complaint: Doing great, having flatus, had some brbpr overnight but resolved, walking a lot, just doing tylenol   Objective: Vital signs in last 24 hours: Temp:  [96.1 F (35.6 C)-99.4 F (37.4 C)] 98.4 F (36.9 C) (11/19 0942) Pulse Rate:  [66-92] 75 (11/19 0942) Resp:  [7-20] 18 (11/19 0942) BP: (113-145)/(69-93) 134/87 (11/19 0942) SpO2:  [84 %-100 %] 99 % (11/19 0942) Weight:  [53.6 kg] 53.6 kg (11/19 0700)    Intake/Output from previous day: 11/18 0701 - 11/19 0700 In: 3037.5 [P.O.:270; I.V.:2667.5; IV Piggyback:100] Out: 2250 [Urine:2200; Blood:50] Intake/Output this shift: Total I/O In: 360 [P.O.:360] Out: -   General nad Cv regular Pulm effort normal Ab dressing dry nontender  Lab Results:  Recent Labs    01/17/21 0400  WBC 8.2  HGB 10.1*  HCT 31.6*  PLT 231   BMET Recent Labs    01/16/21 0625 01/17/21 0400  NA 137 136  K 3.7 3.1*  CL 103 101  CO2 27 28  GLUCOSE 101* 114*  BUN 8 10  CREATININE 0.63 0.73  CALCIUM 8.8* 8.4*   PT/INR No results for input(s): LABPROT, INR in the last 72 hours. ABG No results for input(s): PHART, HCO3 in the last 72 hours.  Invalid input(s): PCO2, PO2  Studies/Results: No results found.  Anti-infectives: Anti-infectives (From admission, onward)    Start     Dose/Rate Route Frequency Ordered Stop   01/16/21 1400  neomycin (MYCIFRADIN) tablet 1,000 mg  Status:  Discontinued       See Hyperspace for full Linked Orders Report.   1,000 mg Oral 3 times per day 01/16/21 0556 01/16/21 0559   01/16/21 1400  metroNIDAZOLE (FLAGYL) tablet 1,000 mg  Status:  Discontinued       See Hyperspace for full Linked Orders Report.   1,000 mg Oral 3 times per day 01/16/21 0556 01/16/21 0559   01/16/21 0600  cefoTEtan (CEFOTAN) 2 g in sodium chloride 0.9 % 100 mL IVPB        2 g 200 mL/hr over 30 Minutes Intravenous On call to O.R. 01/16/21 0556 01/16/21 0824       Assessment/Plan: POD 1  ostomy takedown -dressing off tomorrow -soft diet -po pain control -pulm toilet -likely home in am -replace k, check labs in am    Rolm Bookbinder 01/17/2021

## 2021-01-18 LAB — CBC
HCT: 33.2 % — ABNORMAL LOW (ref 36.0–46.0)
Hemoglobin: 10.4 g/dL — ABNORMAL LOW (ref 12.0–15.0)
MCH: 27.4 pg (ref 26.0–34.0)
MCHC: 31.3 g/dL (ref 30.0–36.0)
MCV: 87.6 fL (ref 80.0–100.0)
Platelets: 221 10*3/uL (ref 150–400)
RBC: 3.79 MIL/uL — ABNORMAL LOW (ref 3.87–5.11)
RDW: 15.2 % (ref 11.5–15.5)
WBC: 7.8 10*3/uL (ref 4.0–10.5)
nRBC: 0 % (ref 0.0–0.2)

## 2021-01-18 LAB — BASIC METABOLIC PANEL
Anion gap: 6 (ref 5–15)
BUN: 15 mg/dL (ref 8–23)
CO2: 29 mmol/L (ref 22–32)
Calcium: 8.8 mg/dL — ABNORMAL LOW (ref 8.9–10.3)
Chloride: 103 mmol/L (ref 98–111)
Creatinine, Ser: 0.8 mg/dL (ref 0.44–1.00)
GFR, Estimated: >60 mL/min (ref >60)
Glucose, Bld: 97 mg/dL (ref 70–99)
Potassium: 3.6 mmol/L (ref 3.5–5.1)
Sodium: 138 mmol/L (ref 135–145)

## 2021-01-18 NOTE — Progress Notes (Signed)
2 Days Post-Op   Subjective/Chief Complaint: Having bowel function, maybe tiny fleck of blood but resolved, walking a lot, tol diet pain controlled, wants to go home   Objective: Vital signs in last 24 hours: Temp:  [97.8 F (36.6 C)-98.8 F (37.1 C)] 98.1 F (36.7 C) (11/20 0630) Pulse Rate:  [75-88] 79 (11/20 0630) Resp:  [16-18] 18 (11/20 0630) BP: (114-134)/(76-87) 114/80 (11/20 0630) SpO2:  [97 %-100 %] 97 % (11/20 0630) Weight:  [60 kg] 60 kg (11/20 0500) Last BM Date: 01/16/21  Intake/Output from previous day: 11/19 0701 - 11/20 0700 In: 2294.1 [P.O.:1080; I.V.:1214.1] Out: 220 [Urine:120; Stool:100] Intake/Output this shift: Total I/O In: 240 [P.O.:240] Out: -   General nad Cv regular Pulm effort normal Ab soft approp tender, packing removed stoma site without infection    Lab Results:  Recent Labs    01/17/21 0400 01/18/21 0410  WBC 8.2 7.8  HGB 10.1* 10.4*  HCT 31.6* 33.2*  PLT 231 221   BMET Recent Labs    01/17/21 0400 01/18/21 0410  NA 136 138  K 3.1* 3.6  CL 101 103  CO2 28 29  GLUCOSE 114* 97  BUN 10 15  CREATININE 0.73 0.80  CALCIUM 8.4* 8.8*   PT/INR No results for input(s): LABPROT, INR in the last 72 hours. ABG No results for input(s): PHART, HCO3 in the last 72 hours.  Invalid input(s): PCO2, PO2  Studies/Results: No results found.  Anti-infectives: Anti-infectives (From admission, onward)    Start     Dose/Rate Route Frequency Ordered Stop   01/16/21 1400  neomycin (MYCIFRADIN) tablet 1,000 mg  Status:  Discontinued       See Hyperspace for full Linked Orders Report.   1,000 mg Oral 3 times per day 01/16/21 0556 01/16/21 0559   01/16/21 1400  metroNIDAZOLE (FLAGYL) tablet 1,000 mg  Status:  Discontinued       See Hyperspace for full Linked Orders Report.   1,000 mg Oral 3 times per day 01/16/21 0556 01/16/21 0559   01/16/21 0600  cefoTEtan (CEFOTAN) 2 g in sodium chloride 0.9 % 100 mL IVPB        2 g 200 mL/hr over  30 Minutes Intravenous On call to O.R. 01/16/21 0556 01/17/21 1647       Assessment/Plan: POD 2 ostomy takedown -dressing off, dry gauze -soft diet -po pain control -pulm toilet -K normal after replacement -dc home today  Rolm Bookbinder 01/18/2021

## 2021-01-18 NOTE — Progress Notes (Signed)
Discharge instructions given to patient and all questions were answered.  

## 2021-01-19 LAB — SURGICAL PATHOLOGY

## 2021-01-20 NOTE — Discharge Summary (Signed)
Patient ID: Geral Tuch MRN: [id number] DOB/AGE: 1950-11-01 70 y.o.  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   Mass of left thigh 07/14/2017   Hematoma of left thigh 03/18/2015   Pain in joint, lower leg 04/05/2014   Hand pain 03/29/2012   Fibromyalgia 03/29/2012   Well adult exam 03/17/2011   HIP PAIN 02/06/2010   HYPERGLYCEMIA 02/06/2010   URI 08/08/2009   ANEMIA-NOS 11/07/2007   Essential hypertension 11/07/2007   Osteoarthritis 11/07/2007    Consultants None  Procedures OR 01/16/21  Robotic assisted low anterior resection Takedown of colostomy Robotic lysis of adhesions x 70 minutes Diagnostic flexible sigmoidoscopy - necessary to evaluate an apparent rectosigmoid stricture Bilateral transversus abdominus plane blocks  Hospital Course: She was admitted postoperatively and did great.  On 01/18/2021 she was tolerating regular diet, having reliable bowel function, pain well controlled on oral analgesics, ambulating, voiding.  She was comfortable with and stable for discharge home.  On the day of discharge, she was seen by my partner, Dr. Donne Hazel.    Allergies as of 01/18/2021       Reactions   Lyrica [pregabalin] Other (See Comments)   Extreme dry mouth, dizziness   Tramadol Nausea And Vomiting, Other (See Comments)   Dizziness        Medication List     TAKE these medications    amLODipine 2.5 MG tablet Commonly known as: NORVASC Take 1 tablet (2.5 mg total) by  mouth daily.   b complex vitamins capsule Take 1 capsule by mouth daily.   BIOTIN PLUS KERATIN PO Take 1 capsule by mouth daily.   Krill Oil 1000 MG Caps Take 1,000 mg by mouth every Monday, Wednesday, and Friday.   meloxicam 15 MG tablet Commonly known as: MOBIC TAKE 1 TABLET BY MOUTH EVERY DAY What changed: when to take this   multivitamin tablet Take 1 tablet by mouth daily.   Olive Leaf Extract 500 MG Caps Take 500 mg by mouth every Monday, Wednesday, and Friday.   oxyCODONE 5 MG immediate release tablet Commonly known as: Roxicodone Take 1 tablet (5 mg total) by mouth every 6 (six) hours as needed for up to 5 days (postop pain not controlled with tylenol and ibuprofen first).   Turmeric 500 MG Caps Take 500 mg by mouth daily.   Vitamin D3 125 MCG (5000 UT) Caps Take 5,000 Units by mouth 2 (two) times a week. Monday and Friday          Follow-up Information     Ileana Roup, MD. Schedule an appointment as soon as possible for a visit in 3 week(s).   Specialties: General Surgery, Colon and Rectal Surgery Why: 2-3 weeks for postop appointment Contact information: [place] [phone]                 Sharon Mt. Dema Severin, M.D. [facility]

## 2021-01-27 ENCOUNTER — Encounter: Payer: Self-pay | Admitting: Emergency Medicine

## 2021-01-27 ENCOUNTER — Other Ambulatory Visit: Payer: Self-pay

## 2021-01-27 ENCOUNTER — Ambulatory Visit: Payer: Federal, State, Local not specified - PPO | Admitting: Emergency Medicine

## 2021-01-27 VITALS — BP 122/78 | HR 62 | Ht 64.0 in | Wt 123.0 lb

## 2021-01-27 DIAGNOSIS — R300 Dysuria: Secondary | ICD-10-CM

## 2021-01-27 DIAGNOSIS — L989 Disorder of the skin and subcutaneous tissue, unspecified: Secondary | ICD-10-CM | POA: Diagnosis not present

## 2021-01-27 LAB — POCT URINALYSIS DIP (MANUAL ENTRY)
Bilirubin, UA: NEGATIVE
Blood, UA: NEGATIVE
Glucose, UA: NEGATIVE mg/dL
Ketones, POC UA: NEGATIVE mg/dL
Leukocytes, UA: NEGATIVE
Nitrite, UA: NEGATIVE
Protein Ur, POC: NEGATIVE mg/dL
Spec Grav, UA: 1.01 (ref 1.010–1.025)
Urobilinogen, UA: 0.2 E.U./dL
pH, UA: 6 (ref 5.0–8.0)

## 2021-01-27 NOTE — Patient Instructions (Signed)
Health Maintenance After Age 69 After age 69, you are at a higher risk for certain long-term diseases and infections as well as injuries from falls. Falls are a major cause of broken bones and head injuries in people who are older than age 69. Getting regular preventive care can help to keep you healthy and well. Preventive care includes getting regular testing and making lifestyle changes as recommended by your health care provider. Talk with your health care provider about: Which screenings and tests you should have. A screening is a test that checks for a disease when you have no symptoms. A diet and exercise plan that is right for you. What should I know about screenings and tests to prevent falls? Screening and testing are the best ways to find a health problem early. Early diagnosis and treatment give you the best chance of managing medical conditions that are common after age 69. Certain conditions and lifestyle choices may make you more likely to have a fall. Your health care provider may recommend: Regular vision checks. Poor vision and conditions such as cataracts can make you more likely to have a fall. If you wear glasses, make sure to get your prescription updated if your vision changes. Medicine review. Work with your health care provider to regularly review all of the medicines you are taking, including over-the-counter medicines. Ask your health care provider about any side effects that may make you more likely to have a fall. Tell your health care provider if any medicines that you take make you feel dizzy or sleepy. Strength and balance checks. Your health care provider may recommend certain tests to check your strength and balance while standing, walking, or changing positions. Foot health exam. Foot pain and numbness, as well as not wearing proper footwear, can make you more likely to have a fall. Screenings, including: Osteoporosis screening. Osteoporosis is a condition that causes  the bones to get weaker and break more easily. Blood pressure screening. Blood pressure changes and medicines to control blood pressure can make you feel dizzy. Depression screening. You may be more likely to have a fall if you have a fear of falling, feel depressed, or feel unable to do activities that you used to do. Alcohol use screening. Using too much alcohol can affect your balance and may make you more likely to have a fall. Follow these instructions at home: Lifestyle Do not drink alcohol if: Your health care provider tells you not to drink. If you drink alcohol: Limit how much you have to: 0-1 drink a day for women. 0-2 drinks a day for men. Know how much alcohol is in your drink. In the [place] one drink equals one 12 oz bottle of beer (355 mL), one 5 oz glass of wine (148 mL), or one 1 oz glass of hard liquor (44 mL). Do not use any products that contain nicotine or tobacco. These products include cigarettes, chewing tobacco, and vaping devices, such as e-cigarettes. If you need help quitting, ask your health care provider. Activity  Follow a regular exercise program to stay fit. This will help you maintain your balance. Ask your health care provider what types of exercise are appropriate for you. If you need a cane or walker, use it as recommended by your health care provider. Wear supportive shoes that have nonskid soles. Safety  Remove any tripping hazards, such as rugs, cords, and clutter. Install safety equipment such as grab bars in bathrooms and safety rails on stairs. Keep rooms and walkways   well-lit. General instructions Talk with your health care provider about your risks for falling. Tell your health care provider if: You fall. Be sure to tell your health care provider about all falls, even ones that seem minor. You feel dizzy, tiredness (fatigue), or off-balance. Take over-the-counter and prescription medicines only as told by your health care provider. These include  supplements. Eat a healthy diet and maintain a healthy weight. A healthy diet includes low-fat dairy products, low-fat (lean) meats, and fiber from whole grains, beans, and lots of fruits and vegetables. Stay current with your vaccines. Schedule regular health, dental, and eye exams. Summary Having a healthy lifestyle and getting preventive care can help to protect your health and wellness after age 69. Screening and testing are the best way to find a health problem early and help you avoid having a fall. Early diagnosis and treatment give you the best chance for managing medical conditions that are more common for people who are older than age 69. Falls are a major cause of broken bones and head injuries in people who are older than age 69. Take precautions to prevent a fall at home. Work with your health care provider to learn what changes you can make to improve your health and wellness and to prevent falls. This information is not intended to replace advice given to you by your health care provider. Make sure you discuss any questions you have with your health care provider. Document Revised: 07/07/2020 Document Reviewed: 07/07/2020 Elsevier Patient Education  2022 [employer] Inc[employer]  

## 2021-01-27 NOTE — Progress Notes (Signed)
[name] 70 y.o.   Chief Complaint  Patient presents with   Dysuria    X 2 days   Hand Pain    Left thumb, pt states a sore bleeds from time to time    HISTORY OF PRESENT ILLNESS: This is a 70 y.o. female developed burning on urination 2 nights ago.  None yesterday or today. Asymptomatic today.  Was advised by nurse to come in and have her urine checked for UTI. Also has a small vascular lesion to her left thumb that has been there for years.  Bleeds with any type of minor trauma. Just wanted to have it checked. No other complaints or medical concerns today.  Dysuria  Pertinent negatives include no chills, flank pain, frequency, hematuria or urgency.  Hand Pain  Pertinent negatives include no chest pain.    Prior to Admission medications   Medication Sig Start Date End Date Taking? Authorizing Provider  amLODipine (NORVASC) 2.5 MG tablet Take 1 tablet (2.5 mg total) by mouth daily. 08/06/20  Yes Plotnikov, Evie Lacks, MD  b complex vitamins capsule Take 1 capsule by mouth daily.   Yes [provider]  Cholecalciferol (VITAMIN D3) 125 MCG (5000 UT) CAPS Take 5,000 Units by mouth 2 (two) times a week. Monday and Friday 03/01/13  Yes [provider]  Astrid Drafts 1000 MG CAPS Take 1,000 mg by mouth every Monday, Wednesday, and Friday.   Yes [provider]  meloxicam (MOBIC) 15 MG tablet TAKE 1 TABLET BY MOUTH EVERY DAY Patient taking differently: Take 15 mg by mouth every Monday, Wednesday, and Friday. 08/12/20  Yes Plotnikov, Evie Lacks, MD  Multiple Vitamin (MULTIVITAMIN) tablet Take 1 tablet by mouth daily.   Yes [provider]  Jonna Coup Leaf Extract 500 MG CAPS Take 500 mg by mouth every Monday, Wednesday, and Friday.   Yes [provider]  Specialty Vitamins Products (BIOTIN PLUS KERATIN PO) Take 1 capsule by mouth daily.   Yes [provider]  Turmeric 500 MG CAPS Take 500 mg by mouth daily.   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   Biceps tendon rupture 05/10/2018   Genetic testing 05/08/2018   Family history of breast cancer    Family history of ovarian cancer    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   Mass of left thigh 07/14/2017   Hematoma of left thigh 03/18/2015   Pain in joint, lower leg 04/05/2014   Hand pain 03/29/2012   Fibromyalgia 03/29/2012   Well adult exam 03/17/2011   HIP PAIN 02/06/2010   HYPERGLYCEMIA 02/06/2010   URI 08/08/2009   ANEMIA-NOS 11/07/2007   Essential hypertension 11/07/2007   Osteoarthritis 11/07/2007    Past Medical History:  Diagnosis Date   Allergy    ANEMIA-NOS 11/07/2007   past hx    Cancer ([place])    Breast- right-    Family history of breast cancer    Family history of ovarian cancer    HIP PAIN 02/06/2010   HYPERGLYCEMIA 02/06/2010  HYPERTENSION 11/07/2007   OSTEOARTHRITIS 11/07/2007   Osteopenia    PONV (postoperative nausea and vomiting)    URI 08/08/2009    Past Surgical History:  Procedure Laterality Date   COLONOSCOPY     COLONOSCOPY     LAPAROTOMY N/A 06/20/2020   Procedure: EXPLORATORY LAPAROTOMY, sigmoidectomy, takedown splenic flexure, end descending colostomy placement.;  Surgeon: Ileana Roup, MD;  Location: [facility];  Service: General;  Laterality: N/A;   LYSIS OF ADHESION N/A 01/16/2021   Procedure: LYSIS OF ADHESION;  Surgeon: Ileana Roup, MD;  Location: [facility];  Service: General;  Laterality: N/A;   PARTIAL COLECTOMY N/A 01/16/2021   Procedure: POSSIBLE  SEGEMNTAL/PARTIAL COLECTOMY;  Surgeon: Ileana Roup, MD;  Location: [facility];  Service: General;  Laterality: N/A;   POLYPECTOMY     SIMPLE MASTECTOMY Right 05/23/2018   SENTINAL NODE MAPPING    SIMPLE MASTECTOMY WITH AXILLARY SENTINEL NODE BIOPSY Right 05/23/2018   Procedure: RIGHT SIMPLE MASTECTOMY WITH  RIGHT SENTINEL NODE [place];  Surgeon: Erroll Luna, MD;  Location: [facility];  Service: General;  Laterality: Right;   VAGINAL HYSTERECTOMY  03/12/2008   with BSO   WISDOM TOOTH EXTRACTION     XI ROBOTIC ASSISTED COLOSTOMY TAKEDOWN N/A 01/16/2021   Procedure: XI ROBOTIC LOW ANTERIOR ASSISTED COLOSTOMY TAKEDOWN;  Surgeon: Ileana Roup, MD;  Location: [facility];  Service: General;  Laterality: N/A;    Social History   Socioeconomic History   Marital status: Married    Spouse name: Interior and spatial designer   Number of children: 3   Years of education: Not on file   Highest education level: Not on file  Occupational History   Not on file  Tobacco Use   Smoking status: Former    Types: Cigarettes    Quit date: 03/01/1973    Years since quitting: 47.9   Smokeless tobacco: Never  Vaping Use   Vaping Use: Never used  Substance and Sexual Activity   Alcohol use: No   Drug use: No   Sexual activity: Yes  Other Topics Concern   Not on file  Social History Narrative   Not on file   Social Determinants of Health   Financial Resource Strain: Not on file  Food Insecurity: Not on file  Transportation Needs: Not on file  Physical Activity: Not on file  Stress: Not on file  Social Connections: Not on file  Intimate Partner Violence: Not on file    Family History  Problem Relation Age of Onset   Cancer Father        mesothelioma   Diabetes Mother    Hypertension Mother    Hypertension Other    Breast cancer Paternal Aunt 18   Ovarian cancer Maternal Aunt        late 59s   Lupus Daughter    Breast cancer Paternal Aunt    Colon cancer Neg Hx    Prostate cancer Neg Hx    Colon  polyps Neg Hx    Esophageal cancer Neg Hx    Rectal cancer Neg Hx    Stomach cancer Neg Hx      Review of Systems  Constitutional: Negative.  Negative for chills and fever.  HENT: Negative.  Negative for congestion and sore throat.   Respiratory: Negative.  Negative for cough and shortness of breath.   Cardiovascular: Negative.  Negative for chest pain and palpitations.  Genitourinary:  Positive for dysuria (Gone today). Negative for flank pain, frequency, hematuria and urgency.  Skin: Negative.  Negative for rash.  Neurological:  Negative for dizziness and headaches.  All other systems reviewed and are negative.   Physical Exam Vitals reviewed.  Constitutional:      Appearance: Normal appearance.  HENT:     Head: Normocephalic.  Eyes:     Extraocular Movements: Extraocular movements intact.  Cardiovascular:     Rate and Rhythm: Normal rate.  Pulmonary:     Effort: Pulmonary effort is normal.  Musculoskeletal:        General: Normal range of motion.     Cervical back: Normal range of motion.  Skin:    General: Skin is warm and dry.  Neurological:     Mental Status: She is alert and oriented to person, place, and time.  Psychiatric:        Mood and Affect: Mood normal.        Behavior: Behavior normal.    Results for orders placed or performed in visit on 01/27/21 (from the past 24 hour(s))  POCT urinalysis dipstick     Status: None   Collection Time: 01/27/21  3:33 PM  Result Value Ref Range   Color, UA yellow yellow   Clarity, UA clear clear   Glucose, UA negative negative mg/dL   Bilirubin, UA negative negative   Ketones, POC UA negative negative mg/dL   Spec Grav, UA 1.010 1.010 - 1.025   Blood, UA negative negative   pH, UA 6.0 5.0 - 8.0   Protein Ur, POC negative negative mg/dL   Urobilinogen, UA 0.2 0.2 or 1.0 E.U./dL   Nitrite, UA Negative Negative   Leukocytes, UA Negative Negative    ASSESSMENT & PLAN: Clinically stable.  No signs or findings of a  UTI.  Urine sent for culture.  No need for antibiotics. Benign thumb lesion.  No treatment needed.  Problem List Items Addressed This Visit   None Visit Diagnoses     Dysuria    -  Primary   Improved   Relevant Orders   POCT urinalysis dipstick (Completed)   Urine Culture   Thumb lesion          Patient Instructions  Health Maintenance After Age 76 After age 78, you are at a higher risk for certain long-term diseases and infections as well as injuries from falls. Falls are a major cause of broken bones and head injuries in people who are older than age 52. Getting regular preventive care can help to keep you healthy and well. Preventive care includes getting regular testing and making lifestyle changes as recommended by your health care provider. Talk with your health care provider about: Which screenings and tests you should have. A screening is a test that checks for a disease when you have no symptoms. A diet and exercise plan that is right for you. What should I know about screenings and tests to prevent falls? Screening and testing are the best ways to find a health problem early. Early diagnosis and treatment give you the best chance of managing medical conditions that are common after age 56. Certain conditions and lifestyle choices may make you more likely to have a fall. Your health care provider may recommend: Regular vision checks. Poor vision and conditions such as cataracts can make you more likely to have a fall. If you wear glasses, make sure to get your prescription updated if your vision changes. Medicine review. Work with your health care provider to regularly review all of the medicines you are taking,  including over-the-counter medicines. Ask your health care provider about any side effects that may make you more likely to have a fall. Tell your health care provider if any medicines that you take make you feel dizzy or sleepy. Strength and balance checks. Your health care  provider may recommend certain tests to check your strength and balance while standing, walking, or changing positions. Foot health exam. Foot pain and numbness, as well as not wearing proper footwear, can make you more likely to have a fall. Screenings, including: Osteoporosis screening. Osteoporosis is a condition that causes the bones to get weaker and break more easily. Blood pressure screening. Blood pressure changes and medicines to control blood pressure can make you feel dizzy. Depression screening. You may be more likely to have a fall if you have a fear of falling, feel depressed, or feel unable to do activities that you used to do. Alcohol use screening. Using too much alcohol can affect your balance and may make you more likely to have a fall. Follow these instructions at home: Lifestyle Do not drink alcohol if: Your health care provider tells you not to drink. If you drink alcohol: Limit how much you have to: 0-1 drink a day for women. 0-2 drinks a day for men. Know how much alcohol is in your drink. In the [place] one drink equals one 12 oz bottle of beer (355 mL), one 5 oz glass of wine (148 mL), or one 1 oz glass of hard liquor (44 mL). Do not use any products that contain nicotine or tobacco. These products include cigarettes, chewing tobacco, and vaping devices, such as e-cigarettes. If you need help quitting, ask your health care provider. Activity  Follow a regular exercise program to stay fit. This will help you maintain your balance. Ask your health care provider what types of exercise are appropriate for you. If you need a cane or walker, use it as recommended by your health care provider. Wear supportive shoes that have nonskid soles. Safety  Remove any tripping hazards, such as rugs, cords, and clutter. Install safety equipment such as grab bars in bathrooms and safety rails on stairs. Keep rooms and walkways well-lit. General instructions Talk with your health  care provider about your risks for falling. Tell your health care provider if: You fall. Be sure to tell your health care provider about all falls, even ones that seem minor. You feel dizzy, tiredness (fatigue), or off-balance. Take over-the-counter and prescription medicines only as told by your health care provider. These include supplements. Eat a healthy diet and maintain a healthy weight. A healthy diet includes low-fat dairy products, low-fat (lean) meats, and fiber from whole grains, beans, and lots of fruits and vegetables. Stay current with your vaccines. Schedule regular health, dental, and eye exams. Summary Having a healthy lifestyle and getting preventive care can help to protect your health and wellness after age 50. Screening and testing are the best way to find a health problem early and help you avoid having a fall. Early diagnosis and treatment give you the best chance for managing medical conditions that are more common for people who are older than age 21. Falls are a major cause of broken bones and head injuries in people who are older than age 73. Take precautions to prevent a fall at home. Work with your health care provider to learn what changes you can make to improve your health and wellness and to prevent falls. This information is not intended to replace advice  given to you by your health care provider. Make sure you discuss any questions you have with your health care provider. Document Revised: 07/07/2020 Document Reviewed: 07/07/2020 Elsevier Patient Education  2022 [place], MD [facility]

## 2021-01-28 ENCOUNTER — Telehealth: Payer: Self-pay

## 2021-01-28 LAB — URINE CULTURE

## 2021-01-28 NOTE — Telephone Encounter (Signed)
Transition Care Management Follow-up Telephone Call Date of discharge and from where: 01/18/2021   [facility] How have you been since you were released from the hospital? Doing well,   Any questions or concerns? No  Items Reviewed: Did the pt receive and understand the discharge instructions provided? Yes  Medications obtained and verified? Yes  Other? No  Any new allergies since your discharge? No  Dietary orders reviewed? Yes Do you have support at home? Yes   Home Care and Equipment/Supplies: Were home health services ordered? no If so, what is the name of the agency?  Has the agency set up a time to come to the patient's home? not applicable Were any new equipment or medical supplies ordered?  No What is the name of the medical supply agency?  Were you able to get the supplies/equipment? not applicable Do you have any questions related to the use of the equipment or supplies? No  Functional Questionnaire: (I = Independent and D = Dependent) ADLs: I  Bathing/Dressing- I  Meal Prep- I  Eating- I  Maintaining continence- GOING TO BATHROOM 6-7 TIMRD PER DAY  Transferring/Ambulation- I  Managing Meds- I  Follow up appointments reviewed:  PCP Hospital f/u appt confirmed? No   Specialist Hospital f/u appt confirmed? Yes  Scheduled to see Dr. Dema Severin on 1207/2022  Are transportation arrangements needed? No  If their condition worsens, is the pt aware to call PCP or go to the Emergency Dept.? Yes Was the patient provided with contact information for the PCP's office or ED? Yes Was to pt encouraged to call back with questions or concerns? Yes  .acs

## 2021-02-15 ENCOUNTER — Other Ambulatory Visit: Payer: Self-pay | Admitting: Internal Medicine

## 2021-05-28 ENCOUNTER — Telehealth: Payer: Self-pay | Admitting: Internal Medicine

## 2021-05-28 NOTE — Telephone Encounter (Signed)
Left message for patient to call back to schedule Medicare Annual Wellness Visit  ? ?Last AWV  07/21/18 ? ?Please schedule at anytime with [facility] if patient calls the office back.   ? ? ?Any questions, please call me at [phone]  ?

## 2021-06-10 ENCOUNTER — Ambulatory Visit (INDEPENDENT_AMBULATORY_CARE_PROVIDER_SITE_OTHER): Payer: Federal, State, Local not specified - PPO

## 2021-06-10 DIAGNOSIS — Z0000 Encounter for general adult medical examination without abnormal findings: Secondary | ICD-10-CM | POA: Diagnosis not present

## 2021-06-10 NOTE — Progress Notes (Addendum)
?I connected with [name] today by telephone and verified that I am speaking with the correct person using two identifiers. ?Location patient: home ?Location provider: work ?Persons participating in the virtual visit: patient, provider. ?  ?I discussed the limitations, risks, security and privacy concerns of performing an evaluation and management service by telephone and the availability of in person appointments. I also discussed with the patient that there may be a patient responsible charge related to this service. The patient expressed understanding and verbally consented to this telephonic visit.  ?  ?Interactive audio and video telecommunications were attempted between this provider and patient, however failed, due to patient having technical difficulties OR patient did not have access to video capability.  We continued and completed visit with audio only. ? ?Some vital signs may be absent or patient reported.  ? ?Time Spent with patient on telephone encounter: 30 minutes ? ?Subjective:  ? [name] is a 71 y.o. female who presents for Medicare Annual (Subsequent) preventive examination. ? ?Review of Systems    ? ?Cardiac Risk Factors include: advanced age (>30mn, >>71women);family history of premature cardiovascular disease ? ?   ?Objective:  ?  ?There were no vitals filed for this visit. ?There is no height or weight on file to calculate BMI. ? ? ?  06/10/2021  ?  9:51 AM 01/16/2021  ?  4:39 PM 12/26/2020  ?  9:02 AM 10/02/2018  ? 12:10 PM 05/23/2018  ?  1:36 PM 05/17/2018  ? 12:07 PM 05/15/2018  ?  3:06 PM  ?Advanced Directives  ?Does Patient Have a Medical Advance Directive? Yes Yes Yes No Yes No;Yes No  ?Type of Advance Directive Living will;Healthcare Power of [place]  Living will;Healthcare Power of Attorney Living will   ?Does patient want to make changes to medical advance directive? No - Patient declined No  - Patient declined   No - Patient declined    ?Copy of [place]?  No - copy requested Yes - validated most recent copy scanned in chart (See row information)  No - copy requested    ?Would patient like information on creating a medical advance directive?    No - Patient declined No - Patient declined  No - Patient declined  ? ? ?Current Medications (verified) ?Outpatient Encounter Medications as of 06/10/2021  ?Medication Sig  ? amLODipine (NORVASC) 2.5 MG tablet Take 1 tablet (2.5 mg total) by mouth daily.  ? b complex vitamins capsule Take 1 capsule by mouth daily.  ? Cholecalciferol (VITAMIN D3) 125 MCG (5000 UT) CAPS Take 5,000 Units by mouth 2 (two) times a week. Monday and Friday  ? Krill Oil 1000 MG CAPS Take 1,000 mg by mouth every Monday, Wednesday, and Friday.  ? magnesium 30 MG tablet Take 30 mg by mouth 2 (two) times daily. Includes Zinc and Vitamin D3  ? meloxicam (MOBIC) 15 MG tablet Take 1 tablet (15 mg total) by mouth daily as needed for pain.  ? Multiple Vitamin (MULTIVITAMIN) tablet Take 1 tablet by mouth daily.  ? Olive Leaf Extract 500 MG CAPS Take 500 mg by mouth every Monday, Wednesday, and Friday.  ? Specialty Vitamins Products (BIOTIN PLUS KERATIN PO) Take 1 capsule by mouth daily.  ? Turmeric 500 MG CAPS Take 500 mg by mouth daily.  ? ?No facility-administered encounter medications on file as of 06/10/2021.  ? ? ?Allergies (verified) ?Lyrica [pregabalin] and Tramadol  ? ?  History: ?Past Medical History:  ?Diagnosis Date  ? Allergy   ? ANEMIA-NOS 11/07/2007  ? past hx   ? Cancer [facility])   ? Breast- right-   ? Family history of breast cancer   ? Family history of ovarian cancer   ? HIP PAIN 02/06/2010  ? HYPERGLYCEMIA 02/06/2010  ? HYPERTENSION 11/07/2007  ? OSTEOARTHRITIS 11/07/2007  ? Osteopenia   ? PONV (postoperative nausea and vomiting)   ? URI 08/08/2009  ? ?Past Surgical History:  ?Procedure Laterality Date  ? COLONOSCOPY    ? COLONOSCOPY    ? LAPAROTOMY N/A 06/20/2020  ?  Procedure: EXPLORATORY LAPAROTOMY, sigmoidectomy, takedown splenic flexure, end descending colostomy placement.;  Surgeon: Ileana Roup, MD;  Location: [place];  Service: General;  Laterality: N/A;  ? LYSIS OF ADHESION N/A 01/16/2021  ? Procedure: LYSIS OF ADHESION;  Surgeon: Ileana Roup, MD;  Location: [facility];  Service: General;  Laterality: N/A;  ? PARTIAL COLECTOMY N/A 01/16/2021  ? Procedure: POSSIBLE SEGEMNTAL/PARTIAL COLECTOMY;  Surgeon: Ileana Roup, MD;  Location: [facility];  Service: General;  Laterality: N/A;  ? POLYPECTOMY    ? SIMPLE MASTECTOMY Right 05/23/2018  ? SENTINAL NODE MAPPING   ? SIMPLE MASTECTOMY WITH AXILLARY SENTINEL NODE BIOPSY Right 05/23/2018  ? Procedure: RIGHT SIMPLE MASTECTOMY WITH  RIGHT SENTINEL NODE MAPPING;  Surgeon: Erroll Luna, MD;  Location: [place];  Service: General;  Laterality: Right;  ? VAGINAL HYSTERECTOMY  03/12/2008  ? with BSO  ? WISDOM TOOTH EXTRACTION    ? XI ROBOTIC ASSISTED COLOSTOMY TAKEDOWN N/A 01/16/2021  ? Procedure: XI ROBOTIC LOW ANTERIOR ASSISTED COLOSTOMY TAKEDOWN;  Surgeon: Ileana Roup, MD;  Location: [facility];  Service: General;  Laterality: N/A;  ? ?Family History  ?Problem Relation Age of Onset  ? Cancer Father   ?     mesothelioma  ? Diabetes Mother   ? Hypertension Mother   ? Hypertension Other   ? Breast cancer Paternal Aunt 58  ? Ovarian cancer Maternal Aunt   ?     late 33s  ? Lupus Daughter   ? Breast cancer Paternal Aunt   ? Colon cancer Neg Hx   ? Prostate cancer Neg Hx   ? Colon polyps Neg Hx   ? Esophageal cancer Neg Hx   ? Rectal cancer Neg Hx   ? Stomach cancer Neg Hx   ? ?Social History  ? ?Socioeconomic History  ? Marital status: Married  ?  Spouse name: [name]  ? Number of children: 3  ? Years of education: Not on file  ? Highest education level: Not on file  ?Occupational History  ? Not on file  ?Tobacco Use  ? Smoking status: Former  ?  Types: Cigarettes  ?  Quit date: 03/01/1973  ?  Years since quitting: 48.3   ? Smokeless tobacco: Never  ?Vaping Use  ? Vaping Use: Never used  ?Substance and Sexual Activity  ? Alcohol use: No  ? Drug use: No  ? Sexual activity: Yes  ?Other Topics Concern  ? Not on file  ?Social History Narrative  ? Not on file  ? ?Social Determinants of Health  ? ?Financial Resource Strain: Low Risk   ? Difficulty of Paying Living Expenses: Not hard at all  ?Food Insecurity: No Food Insecurity  ? Worried About Charity fundraiser in the Last Year: Never true  ? Ran Out of Food in the Last Year: Never true  ?Transportation Needs: No Transportation  Needs  ? Lack of Transportation (Medical): No  ? Lack of Transportation (Non-Medical): No  ?Physical Activity: Sufficiently Active  ? Days of Exercise per Week: 7 days  ? Minutes of Exercise per Session: 60 min  ?Stress: No Stress Concern Present  ? Feeling of Stress : Not at all  ?Social Connections: Socially Integrated  ? Frequency of Communication with Friends and Family: More than three times a week  ? Frequency of Social Gatherings with Friends and Family: More than three times a week  ? Attends Religious Services: More than 4 times per year  ? Active Member of Clubs or Organizations: Yes  ? Attends Archivist Meetings: More than 4 times per year  ? Marital Status: Married  ? ? ?Tobacco Counseling ?Counseling given: Not Answered ? ? ?Clinical Intake: ? ?Pre-visit preparation completed: Yes ? ?Pain : No/denies pain ? ?  ? ?Nutritional Risks: None ?Diabetes: No ? ?How often do you need to have someone help you when you read instructions, pamphlets, or other written materials from your doctor or pharmacy?: 1 - Never ?What is the last grade level you completed in school?: 1 year of [employer] ? ?Diabetic? no ? ?Interpreter Needed?: No ? ?Information entered by :: Lisette Abu, LPN ? ? ?Activities of Daily Living ? ?  06/10/2021  ?  9:55 AM 01/16/2021  ?  4:39 PM  ?In your present state of health, do you have any difficulty performing the  following activities:  ?Hearing? 0 0  ?Vision? 0 0  ?Difficulty concentrating or making decisions? 0 0  ?Walking or climbing stairs? 0 1  ?Dressing or bathing? 0 0  ?Doing errands, shopping? 0 0  ?Preparin

## 2021-06-10 NOTE — Patient Instructions (Signed)
[name] , ?Thank you for taking time to come for your Medicare Wellness Visit. I appreciate your ongoing commitment to your health goals. Please review the following plan we discussed and let me know if I can assist you in the future.  ? ?Screening recommendations/referrals: ?Colonoscopy: 12/01/2018; due every 7 years ?Mammogram: 09/2020; due every year ?Bone Density: never done ?Recommended yearly ophthalmology/optometry visit for glaucoma screening and checkup ?Recommended yearly dental visit for hygiene and checkup ? ?Vaccinations: ?Influenza vaccine: declined ?Pneumococcal vaccine: 06/02/2016, 06/20/2017 ?Tdap vaccine: 03/29/2012 ?Zoster vaccine: 03/29/2012 ?Shingles vaccine: never done   ?Covid-19: 04/13/2019, 05/08/2019, 12/11/2019 ? ?Advanced directives: Yes; Please bring a copy of your health care power of attorney and living will to the office at your convenience. ? ?Conditions/risks identified: Yes ? ?Next appointment: Please schedule your next Medicare Wellness Visit with your Nurse Health Advisor in 1 year by calling [phone]. ? ? ?Preventive Care 77 Years and Older, Female ?Preventive care refers to lifestyle choices and visits with your health care provider that can promote health and wellness. ?What does preventive care include? ?A yearly physical exam. This is also called an annual well check. ?Dental exams once or twice a year. ?Routine eye exams. Ask your health care provider how often you should have your eyes checked. ?Personal lifestyle choices, including: ?Daily care of your teeth and gums. ?Regular physical activity. ?Eating a healthy diet. ?Avoiding tobacco and drug use. ?Limiting alcohol use. ?Practicing safe sex. ?Taking low-dose aspirin every day. ?Taking vitamin and mineral supplements as recommended by your health care provider. ?What happens during an annual well check? ?The services and screenings done by your health care provider during your annual well check will depend on your age,  overall health, lifestyle risk factors, and family history of disease. ?Counseling  ?Your health care provider may ask you questions about your: ?Alcohol use. ?Tobacco use. ?Drug use. ?Emotional well-being. ?Home and relationship well-being. ?Sexual activity. ?Eating habits. ?History of falls. ?Memory and ability to understand (cognition). ?Work and work Statistician. ?Reproductive health. ?Screening  ?You may have the following tests or measurements: ?Height, weight, and BMI. ?Blood pressure. ?Lipid and cholesterol levels. These may be checked every 5 years, or more frequently if you are over 36 years old. ?Skin check. ?Lung cancer screening. You may have this screening every year starting at age 48 if you have a 30-pack-year history of smoking and currently smoke or have quit within the past 15 years. ?Fecal occult blood test (FOBT) of the stool. You may have this test every year starting at age 71. ?Flexible sigmoidoscopy or colonoscopy. You may have a sigmoidoscopy every 5 years or a colonoscopy every 10 years starting at age 73. ?Hepatitis C blood test. ?Hepatitis B blood test. ?Sexually transmitted disease (STD) testing. ?Diabetes screening. This is done by checking your blood sugar (glucose) after you have not eaten for a while (fasting). You may have this done every 1-3 years. ?Bone density scan. This is done to screen for osteoporosis. You may have this done starting at age 59. ?Mammogram. This may be done every 1-2 years. Talk to your health care provider about how often you should have regular mammograms. ?Talk with your health care provider about your test results, treatment options, and if necessary, the need for more tests. ?Vaccines  ?Your health care provider may recommend certain vaccines, such as: ?Influenza vaccine. This is recommended every year. ?Tetanus, diphtheria, and acellular pertussis (Tdap, Td) vaccine. You may need a Td booster every 10 years. ?Zoster  vaccine. You may need this after age  35. ?Pneumococcal 13-valent conjugate (PCV13) vaccine. One dose is recommended after age 49. ?Pneumococcal polysaccharide (PPSV23) vaccine. One dose is recommended after age 29. ?Talk to your health care provider about which screenings and vaccines you need and how often you need them. ?This information is not intended to replace advice given to you by your health care provider. Make sure you discuss any questions you have with your health care provider. ?Document Released: 03/14/2015 Document Revised: 11/05/2015 Document Reviewed: 12/17/2014 ?Elsevier Interactive Patient Education ? 2017 [place]. ? ?Fall Prevention in the Home ?Falls can cause injuries. They can happen to people of all ages. There are many things you can do to make your home safe and to help prevent falls. ?What can I do on the outside of my home? ?Regularly fix the edges of walkways and driveways and fix any cracks. ?Remove anything that might make you trip as you walk through a door, such as a raised step or threshold. ?Trim any bushes or trees on the path to your home. ?Use bright outdoor lighting. ?Clear any walking paths of anything that might make someone trip, such as rocks or tools. ?Regularly check to see if handrails are loose or broken. Make sure that both sides of any steps have handrails. ?Any raised decks and porches should have guardrails on the edges. ?Have any leaves, snow, or ice cleared regularly. ?Use sand or salt on walking paths during winter. ?Clean up any spills in your garage right away. This includes oil or grease spills. ?What can I do in the bathroom? ?Use night lights. ?Install grab bars by the toilet and in the tub and shower. Do not use towel bars as grab bars. ?Use non-skid mats or decals in the tub or shower. ?If you need to sit down in the shower, use a plastic, non-slip stool. ?Keep the floor dry. Clean up any water that spills on the floor as soon as it happens. ?Remove soap buildup in the tub or shower  regularly. ?Attach bath mats securely with double-sided non-slip rug tape. ?Do not have throw rugs and other things on the floor that can make you trip. ?What can I do in the bedroom? ?Use night lights. ?Make sure that you have a light by your bed that is easy to reach. ?Do not use any sheets or blankets that are too big for your bed. They should not hang down onto the floor. ?Have a firm chair that has side arms. You can use this for support while you get dressed. ?Do not have throw rugs and other things on the floor that can make you trip. ?What can I do in the kitchen? ?Clean up any spills right away. ?Avoid walking on wet floors. ?Keep items that you use a lot in easy-to-reach places. ?If you need to reach something above you, use a strong step stool that has a grab bar. ?Keep electrical cords out of the way. ?Do not use floor polish or wax that makes floors slippery. If you must use wax, use non-skid floor wax. ?Do not have throw rugs and other things on the floor that can make you trip. ?What can I do with my stairs? ?Do not leave any items on the stairs. ?Make sure that there are handrails on both sides of the stairs and use them. Fix handrails that are broken or loose. Make sure that handrails are as long as the stairways. ?Check any carpeting to make sure that it  is firmly attached to the stairs. Fix any carpet that is loose or worn. ?Avoid having throw rugs at the top or bottom of the stairs. If you do have throw rugs, attach them to the floor with carpet tape. ?Make sure that you have a light switch at the top of the stairs and the bottom of the stairs. If you do not have them, ask someone to add them for you. ?What else can I do to help prevent falls? ?Wear shoes that: ?Do not have high heels. ?Have rubber bottoms. ?Are comfortable and fit you well. ?Are closed at the toe. Do not wear sandals. ?If you use a stepladder: ?Make sure that it is fully opened. Do not climb a closed stepladder. ?Make sure that  both sides of the stepladder are locked into place. ?Ask someone to hold it for you, if possible. ?Clearly mark and make sure that you can see: ?Any grab bars or handrails. ?First and last steps. ?Where the

## 2021-08-15 ENCOUNTER — Other Ambulatory Visit: Payer: Self-pay | Admitting: Internal Medicine

## 2021-08-24 ENCOUNTER — Ambulatory Visit (INDEPENDENT_AMBULATORY_CARE_PROVIDER_SITE_OTHER): Payer: Federal, State, Local not specified - PPO | Admitting: Internal Medicine

## 2021-08-24 ENCOUNTER — Encounter: Payer: Self-pay | Admitting: Internal Medicine

## 2021-08-24 VITALS — BP 120/82 | HR 72 | Temp 98.2°F | Ht 64.0 in | Wt 124.0 lb

## 2021-08-24 DIAGNOSIS — E559 Vitamin D deficiency, unspecified: Secondary | ICD-10-CM | POA: Diagnosis not present

## 2021-08-24 DIAGNOSIS — Z136 Encounter for screening for cardiovascular disorders: Secondary | ICD-10-CM | POA: Diagnosis not present

## 2021-08-24 DIAGNOSIS — I10 Essential (primary) hypertension: Secondary | ICD-10-CM | POA: Diagnosis not present

## 2021-08-24 DIAGNOSIS — Z0000 Encounter for general adult medical examination without abnormal findings: Secondary | ICD-10-CM | POA: Diagnosis not present

## 2021-08-24 DIAGNOSIS — M1991 Primary osteoarthritis, unspecified site: Secondary | ICD-10-CM | POA: Diagnosis not present

## 2021-08-24 LAB — COMPREHENSIVE METABOLIC PANEL
ALT: 14 U/L (ref 0–35)
AST: 19 U/L (ref 0–37)
Albumin: 4.4 g/dL (ref 3.5–5.2)
Alkaline Phosphatase: 100 U/L (ref 39–117)
BUN: 11 mg/dL (ref 6–23)
CO2: 30 mEq/L (ref 19–32)
Calcium: 9.6 mg/dL (ref 8.4–10.5)
Chloride: 103 mEq/L (ref 96–112)
Creatinine, Ser: 0.82 mg/dL (ref 0.40–1.20)
GFR: 72.51 mL/min (ref >60.00)
Glucose, Bld: 94 mg/dL (ref 70–99)
Potassium: 3.8 mEq/L (ref 3.5–5.1)
Sodium: 141 mEq/L (ref 135–145)
Total Bilirubin: 0.5 mg/dL (ref 0.2–1.2)
Total Protein: 7.1 g/dL (ref 6.0–8.3)

## 2021-08-24 LAB — CBC WITH DIFFERENTIAL/PLATELET
Basophils Absolute: 0 10*3/uL (ref 0.0–0.1)
Basophils Relative: 1 % (ref 0.0–3.0)
Eosinophils Absolute: 0.1 10*3/uL (ref 0.0–0.7)
Eosinophils Relative: 3 % (ref 0.0–5.0)
HCT: 38.8 % (ref 36.0–46.0)
Hemoglobin: 12.7 g/dL (ref 12.0–15.0)
Lymphocytes Relative: 36.8 % (ref 12.0–46.0)
Lymphs Abs: 1.7 10*3/uL (ref 0.7–4.0)
MCHC: 32.7 g/dL (ref 30.0–36.0)
MCV: 85.2 fl (ref 78.0–100.0)
Monocytes Absolute: 0.5 10*3/uL (ref 0.1–1.0)
Monocytes Relative: 9.9 % (ref 3.0–12.0)
Neutro Abs: 2.3 10*3/uL (ref 1.4–7.7)
Neutrophils Relative %: 49.3 % (ref 43.0–77.0)
Platelets: 297 10*3/uL (ref 150.0–400.0)
RBC: 4.56 Mil/uL (ref 3.87–5.11)
RDW: 14.5 % (ref 11.5–15.5)
WBC: 4.6 10*3/uL (ref 4.0–10.5)

## 2021-08-24 LAB — LIPID PANEL
Cholesterol: 214 mg/dL — ABNORMAL HIGH (ref 0–200)
HDL: 88.1 mg/dL (ref >39.00)
LDL Cholesterol: 112 mg/dL — ABNORMAL HIGH (ref 0–99)
NonHDL: 125.8
Total CHOL/HDL Ratio: 2
Triglycerides: 70 mg/dL (ref 0.0–149.0)
VLDL: 14 mg/dL (ref 0.0–40.0)

## 2021-08-24 LAB — URINALYSIS
Bilirubin Urine: NEGATIVE
Hgb urine dipstick: NEGATIVE
Ketones, ur: NEGATIVE
Leukocytes,Ua: NEGATIVE
Nitrite: NEGATIVE
Specific Gravity, Urine: 1.01 (ref 1.000–1.030)
Total Protein, Urine: NEGATIVE
Urine Glucose: NEGATIVE
Urobilinogen, UA: 0.2 (ref 0.0–1.0)
pH: 7.5 (ref 5.0–8.0)

## 2021-08-24 LAB — TSH: TSH: 2.37 u[IU]/mL (ref 0.35–5.50)

## 2021-08-24 MED ORDER — MELOXICAM 15 MG PO TABS: 15.0000 mg | ORAL_TABLET | Freq: Every day | ORAL | 1 refills | Status: DC | PRN

## 2021-08-24 MED ORDER — AMLODIPINE BESYLATE 2.5 MG PO TABS: 2.5000 mg | ORAL_TABLET | Freq: Every day | ORAL | 3 refills | Status: DC

## 2021-08-24 NOTE — Assessment & Plan Note (Signed)
On Vit D 

## 2021-08-24 NOTE — Progress Notes (Signed)
Subjective:  Patient ID: [name], female    DOB: 09-Sep-1950  Age: 71 y.o. MRN: [id number]  CC: No chief complaint on file.   HPI [name] presents for well exam F/u on OA  Outpatient Medications Prior to Visit  Medication Sig Dispense Refill   b complex vitamins capsule Take 1 capsule by mouth daily.     Cholecalciferol (VITAMIN D3) 125 MCG (5000 UT) CAPS Take 5,000 Units by mouth 2 (two) times a week. Monday and Friday     Krill Oil 1000 MG CAPS Take 1,000 mg by mouth every Monday, Wednesday, and Friday.     magnesium 30 MG tablet Take 30 mg by mouth 2 (two) times daily. Includes Zinc and Vitamin D3     Multiple Vitamin (MULTIVITAMIN) tablet Take 1 tablet by mouth daily.     Olive Leaf Extract 500 MG CAPS Take 500 mg by mouth every Monday, Wednesday, and Friday.     Specialty Vitamins Products (BIOTIN PLUS KERATIN PO) Take 1 capsule by mouth daily.     Turmeric 500 MG CAPS Take 500 mg by mouth daily.     amLODipine (NORVASC) 2.5 MG tablet Take 1 tablet (2.5 mg total) by mouth daily. Annual appt is due w/labs must see provider for future refills 30 tablet 0   meloxicam (MOBIC) 15 MG tablet Take 1 tablet (15 mg total) by mouth daily as needed for pain. 90 tablet 0   No facility-administered medications prior to visit.    ROS: Review of Systems  Constitutional:  Negative for activity change, appetite change, chills, fatigue and unexpected weight change.  HENT:  Negative for congestion, mouth sores and sinus pressure.   Eyes:  Negative for visual disturbance.  Respiratory:  Negative for cough and chest tightness.   Gastrointestinal:  Negative for abdominal pain and nausea.  Genitourinary:  Negative for difficulty urinating, frequency and vaginal pain.  Musculoskeletal:  Positive for arthralgias. Negative for back pain and gait problem.  Skin:  Negative for pallor and rash.  Neurological:  Negative for dizziness, tremors, weakness, numbness and headaches.   Psychiatric/Behavioral:  Negative for confusion and sleep disturbance.     Objective:  BP 120/82 (BP Location: Left Arm, Patient Position: Sitting, Cuff Size: Normal)   Pulse 72   Temp 98.2 F (36.8 C) (Oral)   Ht 5\' 4"  (1.626 m)   Wt 124 lb (56.2 kg)   SpO2 96%   BMI 21.28 kg/m   BP Readings from Last 3 Encounters:  08/24/21 120/82  01/27/21 122/78  01/18/21 114/80    Wt Readings from Last 3 Encounters:  08/24/21 124 lb (56.2 kg)  01/27/21 123 lb (55.8 kg)  01/18/21 132 lb 4.4 oz (60 kg)    Physical Exam Constitutional:      General: She is not in acute distress.    Appearance: Normal appearance. She is well-developed.  HENT:     Head: Normocephalic.     Right Ear: External ear normal.     Left Ear: External ear normal.     Nose: Nose normal.  Eyes:     General:        Right eye: No discharge.        Left eye: No discharge.     Conjunctiva/sclera: Conjunctivae normal.     Pupils: Pupils are equal, round, and reactive to light.  Neck:     Thyroid: No thyromegaly.     Vascular: No JVD.     Trachea: No tracheal deviation.  Cardiovascular:     Rate and Rhythm: Normal rate and regular rhythm.     Heart sounds: Normal heart sounds.  Pulmonary:     Effort: No respiratory distress.     Breath sounds: No stridor. No wheezing.  Abdominal:     General: Bowel sounds are normal. There is no distension.     Palpations: Abdomen is soft. There is no mass.     Tenderness: There is no abdominal tenderness. There is no guarding or rebound.  Musculoskeletal:        General: No tenderness.     Cervical back: Normal range of motion and neck supple. No rigidity.  Lymphadenopathy:     Cervical: No cervical adenopathy.  Skin:    Findings: No erythema or rash.  Neurological:     Mental Status: She is oriented to person, place, and time.     Cranial Nerves: No cranial nerve deficit.     Motor: No abnormal muscle tone.     Coordination: Coordination normal.     Deep Tendon  Reflexes: Reflexes normal.  Psychiatric:        Behavior: Behavior normal.        Thought Content: Thought content normal.        Judgment: Judgment normal.     Lab Results  Component Value Date   WBC 7.8 01/18/2021   HGB 10.4 (L) 01/18/2021   HCT 33.2 (L) 01/18/2021   PLT 221 01/18/2021   GLUCOSE 97 01/18/2021   CHOL 206 (H) 08/05/2020   TRIG 57.0 08/05/2020   HDL 75.60 08/05/2020   LDLDIRECT 90.1 03/22/2012   LDLCALC 119 (H) 08/05/2020   ALT 15 01/16/2021   AST 21 01/16/2021   NA 138 01/18/2021   K 3.6 01/18/2021   CL 103 01/18/2021   CREATININE 0.80 01/18/2021   BUN 15 01/18/2021   CO2 29 01/18/2021   TSH 1.96 08/05/2020    No results found.  Assessment & Plan:   Problem List Items Addressed This Visit     Essential hypertension    Chronic  Cont on Amlodipine BP is nl at home      Relevant Medications   amLODipine (NORVASC) 2.5 MG tablet   Osteoarthritis    On Meloxicam prn Blue-Emu cream was recommended to use 2-3 times a day      Relevant Medications   meloxicam (MOBIC) 15 MG tablet   Vitamin D deficiency    On Vit D      Well adult exam - Primary     We discussed age appropriate health related issues, including available/recomended screening tests and vaccinations. Labs were ordered to be later reviewed . All questions were answered. We discussed one or more of the following - seat belt use, use of sunscreen/sun exposure exercise, safe sex, fall risk reduction, second hand smoke exposure, firearm use and storage, seat belt use, a need for adhering to healthy diet and exercise. Labs were ordered.  All questions were answered. Dr. Cliffton Asters (06/20/2020) - Exploratory laparotomy with sigmoidectomy, takedown of splenic flexure, partial omentectomy, creation of end descending colostomy  Prevnar PAP/mammo/DEXA - Dr Huntley Dec Colon 2015, due 2020 Dr Russella Dar Last colon 2020, due 2027 Mammo in Sept       Relevant Orders   TSH   Urinalysis   CBC with  Differential/Platelet   Lipid panel   Comprehensive metabolic panel      Meds ordered this encounter  Medications   amLODipine (NORVASC) 2.5 MG tablet  Sig: Take 1 tablet (2.5 mg total) by mouth daily. Annual appt is due w/labs must see provider for future refills    Dispense:  90 tablet    Refill:  3   meloxicam (MOBIC) 15 MG tablet    Sig: Take 1 tablet (15 mg total) by mouth daily as needed for pain.    Dispense:  90 tablet    Refill:  1      Follow-up: Return in about 1 year (around 08/25/2022) for Wellness Exam.  Sonda Primes, MD

## 2021-09-04 ENCOUNTER — Encounter (HOSPITAL_COMMUNITY): Payer: Self-pay | Admitting: Nurse Practitioner

## 2021-09-11 ENCOUNTER — Encounter (HOSPITAL_COMMUNITY): Payer: Self-pay | Admitting: Nurse Practitioner

## 2021-09-16 ENCOUNTER — Other Ambulatory Visit: Payer: Self-pay | Admitting: Internal Medicine

## 2021-09-22 ENCOUNTER — Encounter (HOSPITAL_COMMUNITY): Payer: Self-pay | Admitting: Nurse Practitioner

## 2021-11-09 LAB — HM DEXA SCAN

## 2022-06-23 ENCOUNTER — Telehealth: Payer: Self-pay

## 2022-06-23 NOTE — Telephone Encounter (Signed)
Contacted [name] to schedule their annual wellness visit. Appointment made for 06/24/22.

## 2022-06-24 ENCOUNTER — Ambulatory Visit (INDEPENDENT_AMBULATORY_CARE_PROVIDER_SITE_OTHER): Payer: Federal, State, Local not specified - PPO

## 2022-06-24 VITALS — Ht 64.0 in | Wt 125.0 lb

## 2022-06-24 DIAGNOSIS — Z0000 Encounter for general adult medical examination without abnormal findings: Secondary | ICD-10-CM

## 2022-06-24 NOTE — Progress Notes (Addendum)
I connected with  [name] on 06/24/22 by a audio enabled telemedicine application and verified that I am speaking with the correct person using two identifiers.  Patient Location: Home  Provider Location: Office/Clinic  I discussed the limitations of evaluation and management by telemedicine. The patient expressed understanding and agreed to proceed.  Subjective:   [name] is a 72 y.o. female who presents for Medicare Annual (Subsequent) preventive examination.  Review of Systems     Cardiac Risk Factors include: advanced age (>23men, >32 women);hypertension;family history of premature cardiovascular disease     Objective:    Today's Vitals   06/24/22 1534  Weight: 125 lb (56.7 kg)  Height: 5\' 4"  (1.626 m)  PainSc: 0-No pain   Body mass index is 21.46 kg/m.     06/24/2022    3:35 PM 06/10/2021    9:51 AM 01/16/2021    4:39 PM 12/26/2020    9:02 AM 10/02/2018   12:10 PM 05/23/2018    1:36 PM 05/17/2018   12:07 PM  Advanced Directives  Does Patient Have a Medical Advance Directive? Yes Yes Yes Yes No Yes No;Yes  Type of Advance Directive Living will Living will;Healthcare Power of [employer] Power of [place];Living will Healthcare Power of [place];Living will  Living will;Healthcare Power of Attorney Living will  Does patient want to make changes to medical advance directive?  No - Patient declined No - Patient declined   No - Patient declined   Copy of Healthcare Power of Attorney in Chart?   No - copy requested Yes - validated most recent copy scanned in chart (See row information)  No - copy requested   Would patient like information on creating a medical advance directive?     No - Patient declined No - Patient declined     Current Medications (verified) Outpatient Encounter Medications as of 06/24/2022  Medication Sig   amLODipine (NORVASC) 2.5 MG tablet Take 1 tablet (2.5 mg total) by mouth daily.   b complex vitamins capsule Take 1 capsule by  mouth daily.   Cholecalciferol (VITAMIN D3) 125 MCG (5000 UT) CAPS Take 5,000 Units by mouth 2 (two) times a week. Monday and Friday   Krill Oil 1000 MG CAPS Take 1,000 mg by mouth every Monday, Wednesday, and Friday.   magnesium 30 MG tablet Take 30 mg by mouth 2 (two) times daily. Includes Zinc and Vitamin D3   meloxicam (MOBIC) 15 MG tablet Take 1 tablet (15 mg total) by mouth daily as needed for pain.   Multiple Vitamin (MULTIVITAMIN) tablet Take 1 tablet by mouth daily.   Olive Leaf Extract 500 MG CAPS Take 500 mg by mouth every Monday, Wednesday, and Friday.   Specialty Vitamins Products (BIOTIN PLUS KERATIN PO) Take 1 capsule by mouth daily.   Turmeric 500 MG CAPS Take 500 mg by mouth daily.   No facility-administered encounter medications on file as of 06/24/2022.    Allergies (verified) Lyrica [pregabalin] and Tramadol   History: Past Medical History:  Diagnosis Date   Allergy    ANEMIA-NOS 11/07/2007   past hx    Cancer    Breast- right-    Family history of breast cancer    Family history of ovarian cancer    HIP PAIN 02/06/2010   HYPERGLYCEMIA 02/06/2010   HYPERTENSION 11/07/2007   OSTEOARTHRITIS 11/07/2007   Osteopenia    PONV (postoperative nausea and vomiting)    URI 08/08/2009   Past Surgical History:  Procedure Laterality Date  COLONOSCOPY     COLONOSCOPY     LAPAROTOMY N/A 06/20/2020   Procedure: EXPLORATORY LAPAROTOMY, sigmoidectomy, takedown splenic flexure, end descending colostomy placement.;  Surgeon: Andria Meuse, MD;  Location: [facility] OR;  Service: General;  Laterality: N/A;   LYSIS OF ADHESION N/A 01/16/2021   Procedure: LYSIS OF ADHESION;  Surgeon: Andria Meuse, MD;  Location: [facility];  Service: General;  Laterality: N/A;   PARTIAL COLECTOMY N/A 01/16/2021   Procedure: POSSIBLE SEGEMNTAL/PARTIAL COLECTOMY;  Surgeon: Andria Meuse, MD;  Location: [facility];  Service: General;  Laterality: N/A;   POLYPECTOMY     SIMPLE MASTECTOMY Right  05/23/2018   SENTINAL NODE MAPPING    SIMPLE MASTECTOMY WITH AXILLARY SENTINEL NODE BIOPSY Right 05/23/2018   Procedure: RIGHT SIMPLE MASTECTOMY WITH  RIGHT SENTINEL NODE MAPPING;  Surgeon: Harriette Bouillon, MD;  Location: [facility] OR;  Service: General;  Laterality: Right;   VAGINAL HYSTERECTOMY  03/12/2008   with BSO   WISDOM TOOTH EXTRACTION     XI ROBOTIC ASSISTED COLOSTOMY TAKEDOWN N/A 01/16/2021   Procedure: XI ROBOTIC LOW ANTERIOR ASSISTED COLOSTOMY TAKEDOWN;  Surgeon: Andria Meuse, MD;  Location: [facility];  Service: General;  Laterality: N/A;   Family History  Problem Relation Age of Onset   Cancer Father        mesothelioma   Diabetes Mother    Hypertension Mother    Hypertension Other    Breast cancer Paternal Aunt 60   Ovarian cancer Maternal Aunt        late 46s   Lupus Daughter    Breast cancer Paternal Aunt    Colon cancer Neg Hx    Prostate cancer Neg Hx    Colon polyps Neg Hx    Esophageal cancer Neg Hx    Rectal cancer Neg Hx    Stomach cancer Neg Hx    Social History   Socioeconomic History   Marital status: Married    Spouse name: Radio producer   Number of children: 3   Years of education: Not on file   Highest education level: Not on file  Occupational History   Not on file  Tobacco Use   Smoking status: Former    Types: Cigarettes    Quit date: 03/01/1973    Years since quitting: 49.3   Smokeless tobacco: Never  Vaping Use   Vaping Use: Never used  Substance and Sexual Activity   Alcohol use: No   Drug use: No   Sexual activity: Yes  Other Topics Concern   Not on file  Social History Narrative   Not on file   Social Determinants of Health   Financial Resource Strain: Low Risk  (06/10/2021)   Overall Financial Resource Strain (CARDIA)    Difficulty of Paying Living Expenses: Not hard at all  Food Insecurity: No Food Insecurity (06/24/2022)   Hunger Vital Sign    Worried About Running Out of Food in the Last Year: Never true    Ran Out of Food in  the Last Year: Never true  Transportation Needs: No Transportation Needs (06/24/2022)   PRAPARE - Transportation    Lack of Transportation (Medical): No    Lack of Transportation (Non-Medical): No  Physical Activity: Sufficiently Active (06/24/2022)   Exercise Vital Sign    Days of Exercise per Week: 5 days    Minutes of Exercise per Session: 60 min  Stress: No Stress Concern Present (06/24/2022)   [employer] of Occupational Health - Occupational Stress Questionnaire  Feeling of Stress : Not at all  Social Connections: Socially Integrated (06/24/2022)   Social Connection and Isolation Panel [NHANES]    Frequency of Communication with Friends and Family: More than three times a week    Frequency of Social Gatherings with Friends and Family: More than three times a week    Attends Religious Services: More than 4 times per year    Active Member of [employer] or Organizations: Yes    Attends Engineer, structural: More than 4 times per year    Marital Status: Married    Tobacco Counseling Counseling given: Not Answered   Clinical Intake:  Pre-visit preparation completed: Yes  Pain : No/denies pain Pain Score: 0-No pain     BMI - recorded: 21.46 Nutritional Status: BMI of 19-24  Normal Nutritional Risks: None Diabetes: No  How often do you need to have someone help you when you read instructions, pamphlets, or other written materials from your doctor or pharmacy?: 1 - Never What is the last grade level you completed in school?: HSG  Diabetic? No  Interpreter Needed?: No  Information entered by :: Susie Cassette, LPN.   Activities of Daily Living    06/24/2022    3:39 PM  In your present state of health, do you have any difficulty performing the following activities:  Hearing? 0  Vision? 0  Difficulty concentrating or making decisions? 0  Walking or climbing stairs? 0  Dressing or bathing? 0  Doing errands, shopping? 0  Preparing Food and eating ? N   Using the Toilet? N  In the past six months, have you accidently leaked urine? N  Do you have problems with loss of bowel control? N  Managing your Medications? N  Managing your Finances? N  Housekeeping or managing your Housekeeping? N    Patient Care Team: Plotnikov, Georgina Quint, MD as PCP - General Harold Hedge, MD as Attending Physician (Obstetrics and Gynecology) Meryl Dare, MD (Gastroenterology) Pershing Proud, RN as Oncology Nurse Navigator Donnelly Angelica, RN as Oncology Nurse Navigator Harriette Bouillon, MD as Consulting Physician (General Surgery) Magrinat, Valentino Hue, MD (Inactive) as Consulting Physician (Oncology) Dorothy Puffer, MD as Consulting Physician (Radiation Oncology) Herschel Senegal, OD as Consulting Physician (Optometry)  Indicate any recent Medical Services you may have received from other than [facility] providers in the past year (date may be approximate).     Assessment:   This is a routine wellness examination for [place].  Hearing/Vision screen Hearing Screening - Comments:: Denies hearing difficulties   Vision Screening - Comments:: Wears rx glasses and contacts - up to date with routine eye exams with [facility] at [address].   Dietary issues and exercise activities discussed: Current Exercise Habits: Home exercise routine, Time (Minutes): 60, Frequency (Times/Week): 5, Weekly Exercise (Minutes/Week): 300, Intensity: Moderate   Goals Addressed             This Visit's Progress    My goal for 2024 is to maintain my health by staying independent and physically active.        Depression Screen    06/24/2022    3:50 PM 06/10/2021   10:01 AM 08/05/2020    9:03 AM 07/24/2019    8:23 AM 07/21/2018    1:17 PM 06/20/2017    8:11 AM 06/02/2016    1:53 PM  PHQ 2/9 Scores  PHQ - 2 Score 0 0 0 0 0 0 0  PHQ- 9 Score 0  0  Fall Risk    06/24/2022    3:36 PM 06/10/2021    9:54 AM 08/05/2020    9:03 AM 07/24/2019    8:23 AM 07/21/2018    1:16 PM  Fall  Risk   Falls in the past year? 0 0 0 0 1  Number falls in past yr: 0 0 0 0 0  Injury with Fall? 0 0 0 0 0  Risk for fall due to : No Fall Risks No Fall Risks History of fall(s)    Follow up Falls prevention discussed Falls evaluation completed   Falls evaluation completed    FALL RISK PREVENTION PERTAINING TO THE HOME:  Any stairs in or around the home? No  If so, are there any without handrails? No  Home free of loose throw rugs in walkways, pet beds, electrical cords, etc? Yes  Adequate lighting in your home to reduce risk of falls? Yes   ASSISTIVE DEVICES UTILIZED TO PREVENT FALLS:  Life alert? No  Use of a cane, walker or w/c? No  Grab bars in the bathroom? Yes  Shower chair or bench in shower? Yes  Elevated toilet seat or a handicapped toilet? Yes   TIMED UP AND GO:  Was the test performed? No . Telephonic Visit  Cognitive Function:        06/24/2022    3:38 PM 06/10/2021   10:03 AM  6CIT Screen  What Year? 0 points 0 points  What month? 0 points 0 points  What time? 0 points 0 points  Count back from 20 0 points 0 points  Months in reverse 0 points 0 points  Repeat phrase 0 points 0 points  Total Score 0 points 0 points    Immunizations Immunization History  Administered Date(s) Administered   Influenza Whole 12/10/2009, 10/31/2010, 09/30/2011, 10/19/2012   Influenza, High Dose Seasonal PF 02/09/2017, 01/24/2018   Influenza,inj,Quad PF,6+ Mos 11/06/2014, 10/15/2015   Influenza-Unspecified 10/12/2013   PFIZER(Purple Top)SARS-COV-2 Vaccination 04/13/2019, 05/08/2019, 12/11/2019   Pneumococcal Conjugate-13 06/02/2016   Pneumococcal Polysaccharide-23 06/20/2017   Tdap 03/29/2012   Zoster, Live 03/29/2012    TDAP status: Due, Education has been provided regarding the importance of this vaccine. Advised may receive this vaccine at local pharmacy or Health Dept. Aware to provide a copy of the vaccination record if obtained from local pharmacy or Health Dept.  Verbalized acceptance and understanding.  Flu Vaccine status: Declined, Education has been provided regarding the importance of this vaccine but patient still declined. Advised may receive this vaccine at local pharmacy or Health Dept. Aware to provide a copy of the vaccination record if obtained from local pharmacy or Health Dept. Verbalized acceptance and understanding.  Pneumococcal vaccine status: Up to date  Covid-19 vaccine status: Completed vaccines  Qualifies for Shingles Vaccine? Yes   Zostavax completed Yes   Shingrix Completed?: No.    Education has been provided regarding the importance of this vaccine. Patient has been advised to call insurance company to determine out of pocket expense if they have not yet received this vaccine. Advised may also receive vaccine at local pharmacy or Health Dept. Verbalized acceptance and understanding.  Screening Tests Health Maintenance  Topic Date Due   Zoster Vaccines- Shingrix (1 of 2) Never done   DEXA SCAN  Never done   MAMMOGRAM  04/13/2020   COVID-19 Vaccine (4 - 2023-24 season) 10/30/2021   DTaP/Tdap/Td (2 - Td or Tdap) 03/29/2022   INFLUENZA VACCINE  09/30/2022   COLONOSCOPY (Pts 45-4yrs Insurance coverage  will need to be confirmed)  11/30/2025   Pneumonia Vaccine 77+ Years old  Completed   Hepatitis C Screening  Completed   HPV VACCINES  Aged Out    Health Maintenance  Health Maintenance Due  Topic Date Due   Zoster Vaccines- Shingrix (1 of 2) Never done   DEXA SCAN  Never done   MAMMOGRAM  04/13/2020   COVID-19 Vaccine (4 - 2023-24 season) 10/30/2021   DTaP/Tdap/Td (2 - Td or Tdap) 03/29/2022    Colorectal cancer screening: Type of screening: Colonoscopy. Completed 12/01/2018. Repeat every 7 years  Mammogram status: Completed 09/2021. Repeat every year  Bone Density status: Not recommended.  Lung Cancer Screening: (Low Dose CT Chest recommended if Age 51-80 years, 30 pack-year currently smoking OR have quit w/in  15years.) does not qualify.   Lung Cancer Screening Referral: no  Additional Screening:  Hepatitis C Screening: does qualify; Completed 05/28/2015  Vision Screening: Recommended annual ophthalmology exams for early detection of glaucoma and other disorders of the eye. Is the patient up to date with their annual eye exam?  Yes  Who is the provider or what is the name of the office in which the patient attends annual eye exams? Estill Bamberg, OD at [facility] on [address]. If pt is not established with a provider, would they like to be referred to a provider to establish care? No .   Dental Screening: Recommended annual dental exams for proper oral hygiene  Community Resource Referral / Chronic Care Management: CRR required this visit?  No   CCM required this visit?  No      Plan:     I have personally reviewed and noted the following in the patient's chart:   Medical and social history Use of alcohol, tobacco or illicit drugs  Current medications and supplements including opioid prescriptions. Patient is not currently taking opioid prescriptions. Functional ability and status Nutritional status Physical activity Advanced directives List of other physicians Hospitalizations, surgeries, and ER visits in previous 12 months Vitals Screenings to include cognitive, depression, and falls Referrals and appointments  In addition, I have reviewed and discussed with patient certain preventive protocols, quality metrics, and best practice recommendations. A written personalized care plan for preventive services as well as general preventive health recommendations were provided to patient.     Mickeal Needy, LPN   06/07/8117   Nurse Notes:  Normal cognitive status assessed by direct observation via telephone by this Nurse Health Advisor. No abnormalities found.    Medical screening examination/treatment/procedure(s) were performed by non-physician practitioner and as  supervising physician I was immediately available for consultation/collaboration.  I agree with above. Jacinta Shoe, MD

## 2022-06-24 NOTE — Patient Instructions (Addendum)
[name] , Thank you for taking time to come for your Medicare Wellness Visit. I appreciate your ongoing commitment to your health goals. Please review the following plan we discussed and let me know if I can assist you in the future.   These are the goals we discussed:  Goals      My goal for 2024 is to maintain my health by staying independent and physically active.        This is a list of the screening recommended for you and due dates:  Health Maintenance  Topic Date Due   Zoster (Shingles) Vaccine (1 of 2) Never done   DEXA scan (bone density measurement)  Never done   Mammogram  04/13/2020   COVID-19 Vaccine (4 - 2023-24 season) 10/30/2021   DTaP/Tdap/Td vaccine (2 - Td or Tdap) 03/29/2022   Flu Shot  09/30/2022   Colon Cancer Screening  11/30/2025   Pneumonia Vaccine  Completed   Hepatitis C Screening: USPSTF Recommendation to screen - Ages 18-79 yo.  Completed   HPV Vaccine  Aged Out    Advanced directives: Yes; patient has a Living Will. Please bring a copy of your health care power of attorney and living will to the office at your convenience.  Conditions/risks identified: Yes  Next appointment: Follow up in one year for your annual wellness visit.   Preventive Care 45 Years and Older, Female Preventive care refers to lifestyle choices and visits with your health care provider that can promote health and wellness. What does preventive care include? A yearly physical exam. This is also called an annual well check. Dental exams once or twice a year. Routine eye exams. Ask your health care provider how often you should have your eyes checked. Personal lifestyle choices, including: Daily care of your teeth and gums. Regular physical activity. Eating a healthy diet. Avoiding tobacco and drug use. Limiting alcohol use. Practicing safe sex. Taking low-dose aspirin every day. Taking vitamin and mineral supplements as recommended by your health care provider. What  happens during an annual well check? The services and screenings done by your health care provider during your annual well check will depend on your age, overall health, lifestyle risk factors, and family history of disease. Counseling  Your health care provider may ask you questions about your: Alcohol use. Tobacco use. Drug use. Emotional well-being. Home and relationship well-being. Sexual activity. Eating habits. History of falls. Memory and ability to understand (cognition). Work and work Astronomer. Reproductive health. Screening  You may have the following tests or measurements: Height, weight, and BMI. Blood pressure. Lipid and cholesterol levels. These may be checked every 5 years, or more frequently if you are over 24 years old. Skin check. Lung cancer screening. You may have this screening every year starting at age 58 if you have a 30-pack-year history of smoking and currently smoke or have quit within the past 15 years. Fecal occult blood test (FOBT) of the stool. You may have this test every year starting at age 6. Flexible sigmoidoscopy or colonoscopy. You may have a sigmoidoscopy every 5 years or a colonoscopy every 10 years starting at age 99. Hepatitis C blood test. Hepatitis B blood test. Sexually transmitted disease (STD) testing. Diabetes screening. This is done by checking your blood sugar (glucose) after you have not eaten for a while (fasting). You may have this done every 1-3 years. Bone density scan. This is done to screen for osteoporosis. You may have this done starting at age  65. Mammogram. This may be done every 1-2 years. Talk to your health care provider about how often you should have regular mammograms. Talk with your health care provider about your test results, treatment options, and if necessary, the need for more tests. Vaccines  Your health care provider may recommend certain vaccines, such as: Influenza vaccine. This is recommended every  year. Tetanus, diphtheria, and acellular pertussis (Tdap, Td) vaccine. You may need a Td booster every 10 years. Zoster vaccine. You may need this after age 37. Pneumococcal 13-valent conjugate (PCV13) vaccine. One dose is recommended after age 76. Pneumococcal polysaccharide (PPSV23) vaccine. One dose is recommended after age 23. Talk to your health care provider about which screenings and vaccines you need and how often you need them. This information is not intended to replace advice given to you by your health care provider. Make sure you discuss any questions you have with your health care provider. Document Released: 03/14/2015 Document Revised: 11/05/2015 Document Reviewed: 12/17/2014 Elsevier Interactive Patient Education  2017 [place] Prevention in the Home Falls can cause injuries. They can happen to people of all ages. There are many things you can do to make your home safe and to help prevent falls. What can I do on the outside of my home? Regularly fix the edges of walkways and driveways and fix any cracks. Remove anything that might make you trip as you walk through a door, such as a raised step or threshold. Trim any bushes or trees on the path to your home. Use bright outdoor lighting. Clear any walking paths of anything that might make someone trip, such as rocks or tools. Regularly check to see if handrails are loose or broken. Make sure that both sides of any steps have handrails. Any raised decks and porches should have guardrails on the edges. Have any leaves, snow, or ice cleared regularly. Use sand or salt on walking paths during winter. Clean up any spills in your garage right away. This includes oil or grease spills. What can I do in the bathroom? Use night lights. Install grab bars by the toilet and in the tub and shower. Do not use towel bars as grab bars. Use non-skid mats or decals in the tub or shower. If you need to sit down in the shower, use a  plastic, non-slip stool. Keep the floor dry. Clean up any water that spills on the floor as soon as it happens. Remove soap buildup in the tub or shower regularly. Attach bath mats securely with double-sided non-slip rug tape. Do not have throw rugs and other things on the floor that can make you trip. What can I do in the bedroom? Use night lights. Make sure that you have a light by your bed that is easy to reach. Do not use any sheets or blankets that are too big for your bed. They should not hang down onto the floor. Have a firm chair that has side arms. You can use this for support while you get dressed. Do not have throw rugs and other things on the floor that can make you trip. What can I do in the kitchen? Clean up any spills right away. Avoid walking on wet floors. Keep items that you use a lot in easy-to-reach places. If you need to reach something above you, use a strong step stool that has a grab bar. Keep electrical cords out of the way. Do not use floor polish or wax that makes floors slippery.  If you must use wax, use non-skid floor wax. Do not have throw rugs and other things on the floor that can make you trip. What can I do with my stairs? Do not leave any items on the stairs. Make sure that there are handrails on both sides of the stairs and use them. Fix handrails that are broken or loose. Make sure that handrails are as long as the stairways. Check any carpeting to make sure that it is firmly attached to the stairs. Fix any carpet that is loose or worn. Avoid having throw rugs at the top or bottom of the stairs. If you do have throw rugs, attach them to the floor with carpet tape. Make sure that you have a light switch at the top of the stairs and the bottom of the stairs. If you do not have them, ask someone to add them for you. What else can I do to help prevent falls? Wear shoes that: Do not have high heels. Have rubber bottoms. Are comfortable and fit you  well. Are closed at the toe. Do not wear sandals. If you use a stepladder: Make sure that it is fully opened. Do not climb a closed stepladder. Make sure that both sides of the stepladder are locked into place. Ask someone to hold it for you, if possible. Clearly mark and make sure that you can see: Any grab bars or handrails. First and last steps. Where the edge of each step is. Use tools that help you move around (mobility aids) if they are needed. These include: Canes. Walkers. Scooters. Crutches. Turn on the lights when you go into a dark area. Replace any light bulbs as soon as they burn out. Set up your furniture so you have a clear path. Avoid moving your furniture around. If any of your floors are uneven, fix them. If there are any pets around you, be aware of where they are. Review your medicines with your doctor. Some medicines can make you feel dizzy. This can increase your chance of falling. Ask your doctor what other things that you can do to help prevent falls. This information is not intended to replace advice given to you by your health care provider. Make sure you discuss any questions you have with your health care provider. Document Released: 12/12/2008 Document Revised: 07/24/2015 Document Reviewed: 03/22/2014 Elsevier Interactive Patient Education  2017 [employer].

## 2022-09-14 ENCOUNTER — Encounter: Payer: Federal, State, Local not specified - PPO | Admitting: Internal Medicine

## 2022-09-15 ENCOUNTER — Ambulatory Visit (INDEPENDENT_AMBULATORY_CARE_PROVIDER_SITE_OTHER): Payer: Federal, State, Local not specified - PPO | Admitting: Internal Medicine

## 2022-09-15 ENCOUNTER — Encounter: Payer: Self-pay | Admitting: Internal Medicine

## 2022-09-15 VITALS — BP 120/80 | HR 77 | Temp 98.0°F | Ht 64.0 in | Wt 121.0 lb

## 2022-09-15 DIAGNOSIS — L910 Hypertrophic scar: Secondary | ICD-10-CM

## 2022-09-15 DIAGNOSIS — D0511 Intraductal carcinoma in situ of right breast: Secondary | ICD-10-CM | POA: Diagnosis not present

## 2022-09-15 DIAGNOSIS — Z0000 Encounter for general adult medical examination without abnormal findings: Secondary | ICD-10-CM

## 2022-09-15 DIAGNOSIS — I10 Essential (primary) hypertension: Secondary | ICD-10-CM | POA: Diagnosis not present

## 2022-09-15 LAB — CBC WITH DIFFERENTIAL/PLATELET
Basophils Absolute: 0.1 10*3/uL (ref 0.0–0.1)
Basophils Relative: 1.2 % (ref 0.0–3.0)
Eosinophils Absolute: 0.2 10*3/uL (ref 0.0–0.7)
Eosinophils Relative: 4.4 % (ref 0.0–5.0)
HCT: 38.2 % (ref 36.0–46.0)
Hemoglobin: 12.2 g/dL (ref 12.0–15.0)
Lymphocytes Relative: 32.3 % (ref 12.0–46.0)
Lymphs Abs: 1.7 10*3/uL (ref 0.7–4.0)
MCHC: 31.8 g/dL (ref 30.0–36.0)
MCV: 85.5 fl (ref 78.0–100.0)
Monocytes Absolute: 0.4 10*3/uL (ref 0.1–1.0)
Monocytes Relative: 7.4 % (ref 3.0–12.0)
Neutro Abs: 3 10*3/uL (ref 1.4–7.7)
Neutrophils Relative %: 54.7 % (ref 43.0–77.0)
Platelets: 286 10*3/uL (ref 150.0–400.0)
RBC: 4.46 Mil/uL (ref 3.87–5.11)
RDW: 15 % (ref 11.5–15.5)
WBC: 5.4 10*3/uL (ref 4.0–10.5)

## 2022-09-15 LAB — COMPREHENSIVE METABOLIC PANEL
ALT: 10 U/L (ref 0–35)
AST: 16 U/L (ref 0–37)
Albumin: 4.1 g/dL (ref 3.5–5.2)
Alkaline Phosphatase: 101 U/L (ref 39–117)
BUN: 11 mg/dL (ref 6–23)
CO2: 30 mEq/L (ref 19–32)
Calcium: 9.6 mg/dL (ref 8.4–10.5)
Chloride: 105 mEq/L (ref 96–112)
Creatinine, Ser: 0.73 mg/dL (ref 0.40–1.20)
GFR: 82.74 mL/min (ref >60.00)
Glucose, Bld: 93 mg/dL (ref 70–99)
Potassium: 4.4 mEq/L (ref 3.5–5.1)
Sodium: 142 mEq/L (ref 135–145)
Total Bilirubin: 0.5 mg/dL (ref 0.2–1.2)
Total Protein: 7 g/dL (ref 6.0–8.3)

## 2022-09-15 LAB — LIPID PANEL
Cholesterol: 212 mg/dL — ABNORMAL HIGH (ref 0–200)
HDL: 81.5 mg/dL (ref >39.00)
LDL Cholesterol: 119 mg/dL — ABNORMAL HIGH (ref 0–99)
NonHDL: 130.97
Total CHOL/HDL Ratio: 3
Triglycerides: 60 mg/dL (ref 0.0–149.0)
VLDL: 12 mg/dL (ref 0.0–40.0)

## 2022-09-15 LAB — URINALYSIS
Bilirubin Urine: NEGATIVE
Hgb urine dipstick: NEGATIVE
Ketones, ur: NEGATIVE
Leukocytes,Ua: NEGATIVE
Nitrite: NEGATIVE
Specific Gravity, Urine: 1.02 (ref 1.000–1.030)
Total Protein, Urine: NEGATIVE
Urine Glucose: NEGATIVE
Urobilinogen, UA: 0.2 (ref 0.0–1.0)
pH: 6 (ref 5.0–8.0)

## 2022-09-15 LAB — TSH: TSH: 1.9 u[IU]/mL (ref 0.35–5.50)

## 2022-09-15 MED ORDER — AMLODIPINE BESYLATE 2.5 MG PO TABS: 2.5000 mg | ORAL_TABLET | Freq: Every day | ORAL | 3 refills | Status: DC

## 2022-09-15 NOTE — Assessment & Plan Note (Signed)
  We discussed age appropriate health related issues, including available/recomended screening tests and vaccinations. Labs were ordered to be later reviewed . All questions were answered. We discussed one or more of the following - seat belt use, use of sunscreen/sun exposure exercise, fall risk reduction, second hand smoke exposure, firearm use and storage, seat belt use, a need for adhering to healthy diet and exercise. Labs were ordered.  All questions were answered. Dr. Cliffton Asters (06/20/2020) - Exploratory laparotomy with sigmoidectomy, takedown of splenic flexure, partial omentectomy, creation of end descending colostomy  Prevnar PAP/mammo/DEXA - Dr Huntley Dec Colon 2015, 2020 due 2027 Dr Russella Dar Last colon 2020, due 2027 Mammo in Sept A cardiac CT scan for calcium scoring offered - declined

## 2022-09-15 NOTE — Assessment & Plan Note (Signed)
S/p right simple mastectomy with right axillary sentinel lymph node mapping 3/20 Dr Mignon Pine - no need to f/u F/u w/Dr Huntley Dec + mammo in Sept

## 2022-09-15 NOTE — Assessment & Plan Note (Signed)
Abdomen - pain, itching Plastic surgery ref was offered

## 2022-09-15 NOTE — Progress Notes (Signed)
Subjective:  Patient ID: [name], female    DOB: May 29, 1950  Age: 72 y.o. MRN: [id number]  CC: Annual Exam   HPI [name] presents for a well exam  Outpatient Medications Prior to Visit  Medication Sig Dispense Refill   b complex vitamins capsule Take 1 capsule by mouth daily.     Cholecalciferol (VITAMIN D3) 125 MCG (5000 UT) CAPS Take 5,000 Units by mouth 2 (two) times a week. Monday and Friday     Krill Oil 1000 MG CAPS Take 1,000 mg by mouth every Monday, Wednesday, and Friday.     magnesium 30 MG tablet Take 30 mg by mouth 2 (two) times daily. Includes Zinc and Vitamin D3     Multiple Vitamin (MULTIVITAMIN) tablet Take 1 tablet by mouth daily.     Specialty Vitamins Products (BIOTIN PLUS KERATIN PO) Take 1 capsule by mouth daily.     Turmeric 500 MG CAPS Take 500 mg by mouth daily.     amLODipine (NORVASC) 2.5 MG tablet Take 1 tablet (2.5 mg total) by mouth daily. 90 tablet 3   meloxicam (MOBIC) 15 MG tablet Take 1 tablet (15 mg total) by mouth daily as needed for pain. 90 tablet 1   Olive Leaf Extract 500 MG CAPS Take 500 mg by mouth every Monday, Wednesday, and Friday.     No facility-administered medications prior to visit.    ROS: Review of Systems  Constitutional:  Negative for activity change, appetite change, chills, fatigue and unexpected weight change.  HENT:  Negative for congestion, mouth sores and sinus pressure.   Eyes:  Negative for visual disturbance.  Respiratory:  Negative for cough and chest tightness.   Gastrointestinal:  Negative for abdominal pain and nausea.  Genitourinary:  Negative for difficulty urinating, frequency and vaginal pain.  Musculoskeletal:  Negative for back pain and gait problem.  Skin:  Negative for pallor and rash.  Neurological:  Negative for dizziness, tremors, weakness, numbness and headaches.  Psychiatric/Behavioral:  Negative for confusion and sleep disturbance.     Objective:  BP 120/80 (BP Location: Left Arm,  Patient Position: Sitting, Cuff Size: Large)   Pulse 77   Temp 98 F (36.7 C) (Oral)   Ht 5\' 4"  (1.626 m)   Wt 121 lb (54.9 kg)   SpO2 95%   BMI 20.77 kg/m   BP Readings from Last 3 Encounters:  09/15/22 120/80  08/24/21 120/82  01/27/21 122/78    Wt Readings from Last 3 Encounters:  09/15/22 121 lb (54.9 kg)  06/24/22 125 lb (56.7 kg)  08/24/21 124 lb (56.2 kg)    Physical Exam Constitutional:      General: She is not in acute distress.    Appearance: Normal appearance. She is well-developed.  HENT:     Head: Normocephalic.     Right Ear: External ear normal.     Left Ear: External ear normal.     Nose: Nose normal.  Eyes:     General:        Right eye: No discharge.        Left eye: No discharge.     Conjunctiva/sclera: Conjunctivae normal.     Pupils: Pupils are equal, round, and reactive to light.  Neck:     Thyroid: No thyromegaly.     Vascular: No JVD.     Trachea: No tracheal deviation.  Cardiovascular:     Rate and Rhythm: Normal rate and regular rhythm.     Heart sounds: Normal  heart sounds.  Pulmonary:     Effort: No respiratory distress.     Breath sounds: No stridor. No wheezing.  Abdominal:     General: Bowel sounds are normal. There is no distension.     Palpations: Abdomen is soft. There is no mass.     Tenderness: There is no abdominal tenderness. There is no guarding or rebound.  Musculoskeletal:        General: No tenderness.     Cervical back: Normal range of motion and neck supple. No rigidity.  Lymphadenopathy:     Cervical: No cervical adenopathy.  Skin:    Findings: No erythema or rash.  Neurological:     Cranial Nerves: No cranial nerve deficit.     Motor: No abnormal muscle tone.     Coordination: Coordination normal.     Deep Tendon Reflexes: Reflexes normal.  Psychiatric:        Behavior: Behavior normal.        Thought Content: Thought content normal.        Judgment: Judgment normal.   Thick keloids on abdomen  Lab  Results  Component Value Date   WBC 4.6 08/24/2021   HGB 12.7 08/24/2021   HCT 38.8 08/24/2021   PLT 297.0 08/24/2021   GLUCOSE 94 08/24/2021   CHOL 214 (H) 08/24/2021   TRIG 70.0 08/24/2021   HDL 88.10 08/24/2021   LDLDIRECT 90.1 03/22/2012   LDLCALC 112 (H) 08/24/2021   ALT 14 08/24/2021   AST 19 08/24/2021   NA 141 08/24/2021   K 3.8 08/24/2021   CL 103 08/24/2021   CREATININE 0.82 08/24/2021   BUN 11 08/24/2021   CO2 30 08/24/2021   TSH 2.37 08/24/2021    No results found.  Assessment & Plan:   Problem List Items Addressed This Visit     Essential hypertension    Chronic  Cont on Amlodipine      Relevant Medications   amLODipine (NORVASC) 2.5 MG tablet   Well adult exam - Primary     We discussed age appropriate health related issues, including available/recomended screening tests and vaccinations. Labs were ordered to be later reviewed . All questions were answered. We discussed one or more of the following - seat belt use, use of sunscreen/sun exposure exercise, fall risk reduction, second hand smoke exposure, firearm use and storage, seat belt use, a need for adhering to healthy diet and exercise. Labs were ordered.  All questions were answered. Dr. Cliffton Asters (06/20/2020) - Exploratory laparotomy with sigmoidectomy, takedown of splenic flexure, partial omentectomy, creation of end descending colostomy  Prevnar PAP/mammo/DEXA - Dr Huntley Dec Colon 2015, 2020 due 2027 Dr Russella Dar Last colon 2020, due 2027 Mammo in Sept A cardiac CT scan for calcium scoring offered - declined      Ductal carcinoma in situ (DCIS) of right breast    S/p right simple mastectomy with right axillary sentinel lymph node mapping 3/20 Dr Mignon Pine - no need to f/u F/u w/Dr Huntley Dec + mammo in Sept         Meds ordered this encounter  Medications   amLODipine (NORVASC) 2.5 MG tablet    Sig: Take 1 tablet (2.5 mg total) by mouth daily.    Dispense:  90 tablet    Refill:  3      Follow-up:  No follow-ups on file.  Julie Primes, MD

## 2022-09-15 NOTE — Assessment & Plan Note (Signed)
Chronic Cont on Amlodipine

## 2022-09-18 ENCOUNTER — Other Ambulatory Visit: Payer: Self-pay | Admitting: Internal Medicine

## 2022-11-25 LAB — HM MAMMOGRAPHY

## 2023-04-16 ENCOUNTER — Ambulatory Visit
Admission: EM | Admit: 2023-04-16 | Discharge: 2023-04-16 | Disposition: A | Discharge status: Home / Self Care | Payer: Medicare Other | Attending: Physician Assistant | Admitting: Physician Assistant

## 2023-04-16 DIAGNOSIS — L03011 Cellulitis of right finger: Secondary | ICD-10-CM | POA: Diagnosis not present

## 2023-04-16 MED ORDER — DOXYCYCLINE HYCLATE 100 MG PO CAPS: 100.0000 mg | ORAL_CAPSULE | Freq: Two times a day (BID) | ORAL | 0 refills | Status: AC

## 2023-04-16 NOTE — ED Triage Notes (Signed)
"  My right hand middle finger is infected, I started noticing it more on Thursday, now it is painful with swelling and drainage". No fever.

## 2023-04-27 ENCOUNTER — Encounter: Payer: Self-pay | Admitting: Physician Assistant

## 2023-04-27 NOTE — ED Provider Notes (Signed)
 [facility]    CSN: [id number] Arrival date & time: 04/16/23  1346      History   Chief Complaint Chief Complaint  Patient presents with   Finger Problem    HPI [name] is a 73 y.o. female.   Patient here today for evaluation of swelling and erythema to right distal middle finger.  She reports that she started noticing it several days ago but has been worse recently with more pain and swelling.  She has noticed some drainage from it.  She denies any fever.  She does not report any injury.  The history is provided by the patient.    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/2020   Vitamin D deficiency 07/24/2019   Neoplasm of right breast, 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   Biceps tendon rupture 05/10/2018   Genetic testing 05/08/2018   Family history of breast cancer    Family history of ovarian cancer    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   Malignant tumor of breast (HCC) 04/18/2018   Mass of left thigh 07/14/2017   Hematoma of left thigh 03/18/2015   Pain in joint, lower leg 04/05/2014   Well adult exam 03/17/2011   HIP PAIN 02/06/2010   Essential hypertension 11/07/2007   Osteoarthritis 11/07/2007    Past Surgical History:  Procedure Laterality Date   COLONOSCOPY     COLONOSCOPY     LAPAROTOMY N/A 06/20/2020   Procedure: EXPLORATORY LAPAROTOMY,  sigmoidectomy, takedown splenic flexure, end descending colostomy placement.;  Surgeon: Andria Meuse, MD;  Location: [facility];  Service: General;  Laterality: N/A;   LYSIS OF ADHESION N/A 01/16/2021   Procedure: LYSIS OF ADHESION;  Surgeon: Andria Meuse, MD;  Location: [facility];  Service: General;  Laterality: N/A;   PARTIAL COLECTOMY N/A 01/16/2021   Procedure: POSSIBLE SEGEMNTAL/PARTIAL COLECTOMY;  Surgeon: Andria Meuse, MD;  Location: [facility];  Service: General;  Laterality: N/A;   POLYPECTOMY     SIMPLE MASTECTOMY Right 05/23/2018   SENTINAL NODE MAPPING    SIMPLE MASTECTOMY WITH AXILLARY SENTINEL NODE BIOPSY Right 05/23/2018   Procedure: RIGHT SIMPLE MASTECTOMY WITH  RIGHT SENTINEL NODE MAPPING;  Surgeon: Harriette Bouillon, MD;  Location: [facility];  Service: General;  Laterality: Right;   VAGINAL HYSTERECTOMY  03/12/2008   with BSO   WISDOM TOOTH EXTRACTION     XI ROBOTIC ASSISTED COLOSTOMY TAKEDOWN N/A 01/16/2021   Procedure: XI ROBOTIC LOW ANTERIOR ASSISTED COLOSTOMY TAKEDOWN;  Surgeon: Andria Meuse, MD;  Location[facility];  Service: General;  Laterality: N/A;    OB History   No obstetric history on file.      Home Medications    Prior to Admission medications   Medication Sig Start Date End Date Taking? Authorizing Provider  amLODipine (NORVASC) 2.5  MG tablet TAKE 1 TABLET BY MOUTH EVERY DAY 09/20/22  Yes Plotnikov, Georgina Quint, MD  b complex vitamins capsule Take 1 capsule by mouth daily.   Yes [provider]  Cholecalciferol (VITAMIN D3) 125 MCG (5000 UT) CAPS Take 5,000 Units by mouth 2 (two) times a week. Monday and Friday 03/01/13  Yes [provider]  [place] Lanius 1000 MG CAPS Take 1,000 mg by mouth every Monday, Wednesday, and Friday.   Yes [provider]  magnesium 30 MG tablet Take 30 mg by mouth 2 (two) times daily. Includes Zinc and Vitamin D3   Yes [provider]  Multiple Vitamin (MULTIVITAMIN) tablet Take 1  tablet by mouth daily.   Yes [provider]  Specialty Vitamins Products (BIOTIN PLUS KERATIN PO) Take 1 capsule by mouth daily.   Yes [provider]  Turmeric 500 MG CAPS Take 500 mg by mouth daily.    [provider]    Family History Family History  Problem Relation Age of Onset   Cancer Father        mesothelioma   Diabetes Mother    Hypertension Mother    Hypertension Other    Breast cancer Paternal Aunt 69   Ovarian cancer Maternal Aunt        late 79s   Lupus Daughter    Breast cancer Paternal Aunt    Colon cancer Neg Hx    Prostate cancer Neg Hx    Colon polyps Neg Hx    Esophageal cancer Neg Hx    Rectal cancer Neg Hx    Stomach cancer Neg Hx     Social History Social History   Tobacco Use   Smoking status: Former    Current packs/day: 0.00    Types: Cigarettes    Quit date: 03/01/1973    Years since quitting: 50.1   Smokeless tobacco: Never  Vaping Use   Vaping status: Never Used  Substance Use Topics   Alcohol use: No   Drug use: No     Allergies   Lyrica [pregabalin] and Tramadol   Review of Systems Review of Systems  Constitutional:  Negative for chills and fever.  Eyes:  Negative for discharge and redness.  Gastrointestinal:  Negative for abdominal pain, nausea and vomiting.  Skin:  Positive for color change. Negative for wound.     Physical Exam Triage Vital Signs ED Triage Vitals  Encounter Vitals Group     BP 04/16/23 1457 (!) 160/96     Systolic BP Percentile --      Diastolic BP Percentile --      Pulse Rate 04/16/23 1457 75     Resp 04/16/23 1457 18     Temp 04/16/23 1457 98.1 F (36.7 C)     Temp Source 04/16/23 1457 Oral     SpO2 04/16/23 1457 97 %     Weight 04/16/23 1455 122 lb (55.3 kg)     Height 04/16/23 1455 5\' 4"  (1.626 m)     Head Circumference --      Peak Flow --      Pain Score 04/16/23 1451 0     Pain Loc --      Pain Education --      Exclude from Growth Chart --    No data  found.  Updated Vital Signs BP (!) 160/96 (BP Location: Right Arm)   Pulse 75   Temp 98.1 F (36.7 C) (Oral)   Resp 18   Ht  5\' 4"  (1.626 m)   Wt 122 lb (55.3 kg)   SpO2 97%   BMI 20.94 kg/m   Visual Acuity Right Eye Distance:   Left Eye Distance:   Bilateral Distance:    Right Eye Near:   Left Eye Near:    Bilateral Near:     Physical Exam Vitals and nursing note reviewed.  Constitutional:      General: She is not in acute distress.    Appearance: Normal appearance. She is not ill-appearing.  HENT:     Head: Normocephalic and atraumatic.  Eyes:     Conjunctiva/sclera: Conjunctivae normal.  Cardiovascular:     Rate and Rhythm: Normal rate.  Pulmonary:     Effort: Pulmonary effort is normal. No respiratory distress.  Skin:    Comments: Mild swelling and erythema appreciated to the lateral right middle finger with purulent fluid collection noted around cuticle  Neurological:     Mental Status: She is alert.  Psychiatric:        Mood and Affect: Mood normal.        Behavior: Behavior normal.        Thought Content: Thought content normal.      UC Treatments / Results  Labs (all labs ordered are listed, but only abnormal results are displayed) Labs Reviewed - No data to display  EKG   Radiology No results found.  Procedures Procedures (including critical care time)  Medications Ordered in UC Medications - No data to display  Initial Impression / Assessment and Plan / UC Course  I have reviewed the triage vital signs and the nursing notes.  Pertinent labs & imaging results that were available during my care of the patient were reviewed by me and considered in my medical decision making (see chart for details).    Discussed most likely paronychia with and offered incision and drainage versus antibiotic treatment.  Patient would like to proceed with antibiotic treatment and will soak finger to promote drainage and will plan follow-up if no improvement  with same.  Final Clinical Impressions(s) / UC Diagnoses   Final diagnoses:  Paronychia of finger of right hand   Discharge Instructions   None    ED Prescriptions     Medication Sig Dispense Auth. Provider   doxycycline (VIBRAMYCIN) 100 MG capsule Take 1 capsule (100 mg total) by mouth 2 (two) times daily for 7 days. 14 capsule Tomi Bamberger, PA-C      PDMP not reviewed this encounter.   Tomi Bamberger, PA-C 04/27/23 1329

## 2023-06-27 ENCOUNTER — Ambulatory Visit (INDEPENDENT_AMBULATORY_CARE_PROVIDER_SITE_OTHER)

## 2023-06-27 VITALS — Ht 64.0 in | Wt 122.0 lb

## 2023-06-27 DIAGNOSIS — Z0000 Encounter for general adult medical examination without abnormal findings: Secondary | ICD-10-CM | POA: Diagnosis not present

## 2023-06-27 NOTE — Progress Notes (Cosign Needed)
 Subjective:   [name] is a 73 y.o. who presents for a Medicare Wellness preventive visit.  Visit Complete: Virtual I connected with  [place] on 06/27/23 by a audio enabled telemedicine application and verified that I am speaking with the correct person using two identifiers.  Patient Location: Home  Provider Location: Home Office  I discussed the limitations of evaluation and management by telemedicine. The patient expressed understanding and agreed to proceed.  Vital Signs: Because this visit was a virtual/telehealth visit, some criteria may be missing or patient reported. Any vitals not documented were not able to be obtained and vitals that have been documented are patient reported.  VideoDeclined- This patient declined Librarian, academic. Therefore the visit was completed with audio only.  Persons Participating in Visit: Patient.  AWV Questionnaire: No: Patient Medicare AWV questionnaire was not completed prior to this visit.  Cardiac Risk Factors include: advanced age (>31men, >28 women);hypertension     Objective:    Today's Vitals   06/27/23 1015  Weight: 122 lb (55.3 kg)  Height: 5\' 4"  (1.626 m)   Body mass index is 20.94 kg/m.     06/27/2023   10:23 AM 06/24/2022    3:35 PM 06/10/2021    9:51 AM 01/16/2021    4:39 PM 12/26/2020    9:02 AM 10/02/2018   12:10 PM 05/23/2018    1:36 PM  Advanced Directives  Does Patient Have a Medical Advance Directive? No;Yes Yes Yes Yes Yes No Yes  Type of Advance Directive Living will Living will Living will;Healthcare Power of [employer] Power of [place];Living will Healthcare Power of [place];Living will  Living will;Healthcare Power of Attorney  Does patient want to make changes to medical advance directive?   No - Patient declined No - Patient declined   No - Patient declined  Copy of Healthcare Power of Attorney in Chart?    No - copy requested Yes - validated most recent  copy scanned in chart (See row information)  No - copy requested  Would patient like information on creating a medical advance directive?      No - Patient declined No - Patient declined    Current Medications (verified) Outpatient Encounter Medications as of 06/27/2023  Medication Sig   amLODipine  (NORVASC ) 2.5 MG tablet TAKE 1 TABLET BY MOUTH EVERY DAY   b complex vitamins capsule Take 1 capsule by mouth daily.   Calcium Carb-Cholecalciferol  (CALCIUM 500 +D PO)    Cholecalciferol  (VITAMIN D3) 125 MCG (5000 UT) CAPS Take 5,000 Units by mouth 2 (two) times a week. Monday and Friday   Krill Oil 1000 MG CAPS Take 1,000 mg by mouth every Monday, Wednesday, and Friday.   Magnesium  400 MG CAPS    Multiple Vitamin (MULTIVITAMIN ADULT PO)    Specialty Vitamins Products (BIOTIN PLUS KERATIN PO) Take 1 capsule by mouth daily.   Turmeric 500 MG CAPS Take 500 mg by mouth daily.   [DISCONTINUED] Multiple Vitamin (MULTIVITAMIN) tablet Take 1 tablet by mouth daily.   B Complex Vitamins (B COMPLEX 1 PO)    Fish Oil-Krill Oil (KRILL OIL PLUS PO)    [DISCONTINUED] magnesium  30 MG tablet Take 30 mg by mouth 2 (two) times daily. Includes Zinc and Vitamin D3 (Patient not taking: Reported on 06/27/2023)   No facility-administered encounter medications on file as of 06/27/2023.    Allergies (verified) Lyrica  [pregabalin ] and Tramadol    History: Past Medical History:  Diagnosis Date   Allergy  ANEMIA-NOS 11/07/2007   past hx    Cancer (HCC)    Breast- right-    Family history of breast cancer    Family history of ovarian cancer    HIP PAIN 02/06/2010   HYPERGLYCEMIA 02/06/2010   HYPERTENSION 11/07/2007   OSTEOARTHRITIS 11/07/2007   Osteopenia    PONV (postoperative nausea and vomiting)    URI 08/08/2009   Past Surgical History:  Procedure Laterality Date   COLONOSCOPY     COLONOSCOPY     LAPAROTOMY N/A 06/20/2020   Procedure: EXPLORATORY LAPAROTOMY, sigmoidectomy, takedown splenic flexure, end  descending colostomy placement.;  Surgeon: Melvenia Stabs, MD;  Location: [facility];  Service: General;  Laterality: N/A;   LYSIS OF ADHESION N/A 01/16/2021   Procedure: LYSIS OF ADHESION;  Surgeon: Melvenia Stabs, MD;  Location: [facility];  Service: General;  Laterality: N/A;   PARTIAL COLECTOMY N/A 01/16/2021   Procedure: POSSIBLE SEGEMNTAL/PARTIAL COLECTOMY;  Surgeon: Melvenia Stabs, MD;  Location: [facility];  Service: General;  Laterality: N/A;   POLYPECTOMY     SIMPLE MASTECTOMY Right 05/23/2018   SENTINAL NODE MAPPING    SIMPLE MASTECTOMY WITH AXILLARY SENTINEL NODE BIOPSY Right 05/23/2018   Procedure: RIGHT SIMPLE MASTECTOMY WITH  RIGHT SENTINEL NODE MAPPING;  Surgeon: Sim Dryer, MD;  Location: [facility];  Service: General;  Laterality: Right;   VAGINAL HYSTERECTOMY  03/12/2008   with BSO   WISDOM TOOTH EXTRACTION     XI ROBOTIC ASSISTED COLOSTOMY TAKEDOWN N/A 01/16/2021   Procedure: XI ROBOTIC LOW ANTERIOR ASSISTED COLOSTOMY TAKEDOWN;  Surgeon: Melvenia Stabs, MD;  Location: [facility];  Service: General;  Laterality: N/A;   Family History  Problem Relation Age of Onset   Cancer Father        mesothelioma   Diabetes Mother    Hypertension Mother    Hypertension Other    Breast cancer Paternal Aunt 69   Ovarian cancer Maternal Aunt        late 15s   Lupus Daughter    Breast cancer Paternal Aunt    Colon cancer Neg Hx    Prostate cancer Neg Hx    Colon polyps Neg Hx    Esophageal cancer Neg Hx    Rectal cancer Neg Hx    Stomach cancer Neg Hx    Social History   Socioeconomic History   Marital status: Married    Spouse name: Radio producer   Number of children: 3   Years of education: Not on file   Highest education level: Not on file  Occupational History   Not on file  Tobacco Use   Smoking status: Former    Current packs/day: 0.00    Types: Cigarettes    Quit date: 03/01/1973    Years since quitting: 50.3   Smokeless tobacco: Never  Vaping Use   Vaping  status: Never Used  Substance and Sexual Activity   Alcohol use: No   Drug use: No   Sexual activity: Yes    Birth control/protection: None  Other Topics Concern   Not on file  Social History Narrative   Lives with husband   Social Drivers of Health   Financial Resource Strain: Low Risk  (06/27/2023)   Overall Financial Resource Strain (CARDIA)    Difficulty of Paying Living Expenses: Not hard at all  Food Insecurity: No Food Insecurity (06/27/2023)   Hunger Vital Sign    Worried About Running Out of Food in the Last Year: Never true  Ran Out of Food in the Last Year: Never true  Transportation Needs: No Transportation Needs (06/27/2023)   PRAPARE - Administrator, Civil Service (Medical): No    Lack of Transportation (Non-Medical): No  Physical Activity: Sufficiently Active (06/27/2023)   Exercise Vital Sign    Days of Exercise per Week: 5 days    Minutes of Exercise per Session: 60 min  Stress: No Stress Concern Present (06/27/2023)   [employer] of Occupational Health - Occupational Stress Questionnaire    Feeling of Stress : Not at all  Social Connections: Socially Integrated (06/27/2023)   Social Connection and Isolation Panel [NHANES]    Frequency of Communication with Friends and Family: More than three times a week    Frequency of Social Gatherings with Friends and Family: Once a week    Attends Religious Services: More than 4 times per year    Active Member of [employer] or Organizations: Yes    Attends Banker Meetings: Never    Marital Status: Married    Tobacco Counseling Counseling given: Not Answered    Clinical Intake:  Pre-visit preparation completed: Yes  Pain : No/denies pain     BMI - recorded: 20.94 Nutritional Status: BMI of 19-24  Normal Nutritional Risks: None Diabetes: No  No results found for: "HGBA1C"   How often do you need to have someone help you when you read instructions, pamphlets, or other written  materials from your doctor or pharmacy?: 1 - Never  Interpreter Needed?: No  Information entered by :: Darlys Buis, RMA   Activities of Daily Living     06/27/2023   10:16 AM  In your present state of health, do you have any difficulty performing the following activities:  Hearing? 0  Vision? 0  Difficulty concentrating or making decisions? 0  Walking or climbing stairs? 0  Dressing or bathing? 0  Doing errands, shopping? 0  Preparing Food and eating ? N  Using the Toilet? N  In the past six months, have you accidently leaked urine? N  Do you have problems with loss of bowel control? N  Managing your Medications? N  Managing your Finances? N  Housekeeping or managing your Housekeeping? N    Patient Care Team: Plotnikov, Oakley Bellman, MD as PCP - General Thora Flint, MD as Attending Physician (Obstetrics and Gynecology) Asencion Blacksmith, MD (Inactive) (Gastroenterology) Auther Bo, RN as Oncology Nurse Navigator Alane Hsu, RN as Oncology Nurse Navigator Sim Dryer, MD as Consulting Physician (General Surgery) Magrinat, Rozella Cornfield, MD (Inactive) as Consulting Physician (Oncology) Johna Myers, MD as Consulting Physician (Radiation Oncology) Phineas Breech, OD as Consulting Physician (Optometry)  Indicate any recent Medical Services you may have received from other than [facility] providers in the past year (date may be approximate).     Assessment:   This is a routine wellness examination for [place].  Hearing/Vision screen Hearing Screening - Comments:: Denies hearing difficulties   Vision Screening - Comments:: Wears contact lenes   Goals Addressed             This Visit's Progress    My goal for 2024 is to maintain my health by staying independent and physically active.   On track      Depression Screen     06/27/2023   10:26 AM 06/24/2022    3:50 PM 06/10/2021   10:01 AM 08/05/2020    9:03 AM 07/24/2019    8:23 AM 07/21/2018  1:17 PM  06/20/2017    8:11 AM  PHQ 2/9 Scores  PHQ - 2 Score 0 0 0 0 0 0 0  PHQ- 9 Score 0 0  0       Fall Risk     06/27/2023   10:24 AM 06/24/2022    3:36 PM 06/10/2021    9:54 AM 08/05/2020    9:03 AM 07/24/2019    8:23 AM  Fall Risk   Falls in the past year? 0 0 0 0 0  Number falls in past yr: 0 0 0 0 0  Injury with Fall? 0 0 0 0 0  Risk for fall due to : No Fall Risks No Fall Risks No Fall Risks History of fall(s)   Follow up Falls prevention discussed;Falls evaluation completed Falls prevention discussed Falls evaluation completed      MEDICARE RISK AT HOME:  Medicare Risk at Home Any stairs in or around the home?: Yes (outside coming in) If so, are there any without handrails?: Yes Home free of loose throw rugs in walkways, pet beds, electrical cords, etc?: Yes Adequate lighting in your home to reduce risk of falls?: Yes Life alert?: No Use of a cane, walker or w/c?: No Grab bars in the bathroom?: Yes Shower chair or bench in shower?: Yes Elevated toilet seat or a handicapped toilet?: Yes  TIMED UP AND GO:  Was the test performed?  No  Cognitive Function: Declined/Normal: No cognitive concerns noted by patient or family. Patient alert, oriented, able to answer questions appropriately and recall recent events. No signs of memory loss or confusion.        06/24/2022    3:38 PM 06/10/2021   10:03 AM  6CIT Screen  What Year? 0 points 0 points  What month? 0 points 0 points  What time? 0 points 0 points  Count back from 20 0 points 0 points  Months in reverse 0 points 0 points  Repeat phrase 0 points 0 points  Total Score 0 points 0 points    Immunizations Immunization History  Administered Date(s) Administered   Influenza Whole 12/10/2009, 10/31/2010, 09/30/2011, 10/19/2012   Influenza, High Dose Seasonal PF 02/09/2017, 01/24/2018   Influenza,inj,Quad PF,6+ Mos 11/06/2014, 10/15/2015   Influenza-Unspecified 10/12/2013   PFIZER(Purple Top)SARS-COV-2 Vaccination  04/13/2019, 05/08/2019, 12/11/2019   Pneumococcal Conjugate-13 06/02/2016   Pneumococcal Polysaccharide-23 06/20/2017   Tdap 03/29/2012   Zoster, Live 03/29/2012    Screening Tests Health Maintenance  Topic Date Due   Zoster Vaccines- Shingrix (1 of 2) 04/17/1970   DEXA SCAN  Never done   MAMMOGRAM  04/13/2020   DTaP/Tdap/Td (2 - Td or Tdap) 03/29/2022   COVID-19 Vaccine (4 - 2024-25 season) 10/31/2022   INFLUENZA VACCINE  09/30/2023   Medicare Annual Wellness (AWV)  06/26/2024   Colonoscopy  11/30/2025   Pneumonia Vaccine 61+ Years old  Completed   Hepatitis C Screening  Completed   HPV VACCINES  Aged Out   Meningococcal B Vaccine  Aged Out    Health Maintenance  Health Maintenance Due  Topic Date Due   Zoster Vaccines- Shingrix (1 of 2) 04/17/1970   DEXA SCAN  Never done   MAMMOGRAM  04/13/2020   DTaP/Tdap/Td (2 - Td or Tdap) 03/29/2022   COVID-19 Vaccine (4 - 2024-25 season) 10/31/2022   Health Maintenance Items Addressed: See Nurse Notes  Additional Screening:  Vision Screening: Recommended annual ophthalmology exams for early detection of glaucoma and other disorders of the eye.  Dental Screening:  Recommended annual dental exams for proper oral hygiene  Community Resource Referral / Chronic Care Management: CRR required this visit?  No   CCM required this visit?  No     Plan:     I have personally reviewed and noted the following in the patient's chart:   Medical and social history Use of alcohol, tobacco or illicit drugs  Current medications and supplements including opioid prescriptions. Patient is not currently taking opioid prescriptions. Functional ability and status Nutritional status Physical activity Advanced directives List of other physicians Hospitalizations, surgeries, and ER visits in previous 12 months Vitals Screenings to include cognitive, depression, and falls Referrals and appointments  In addition, I have reviewed and  discussed with patient certain preventive protocols, quality metrics, and best practice recommendations. A written personalized care plan for preventive services as well as general preventive health recommendations were provided to patient.     Shakeila Pfarr L Henny Strauch, CMA   06/27/2023   After Visit Summary: (MyChart) Due to this being a telephonic visit, the after visit summary with patients personalized plan was offered to patient via MyChart   Notes: Please refer to Routing Comments.  Medical screening examination/treatment/procedure(s) were performed by non-physician practitioner and as supervising physician I was immediately available for consultation/collaboration.  I agree with above. Adelaide Holy, MD

## 2023-06-27 NOTE — Patient Instructions (Addendum)
 Ms. Kanode , Thank you for taking time to come for your Medicare Wellness Visit. I appreciate your ongoing commitment to your health goals. Please review the following plan we discussed and let me know if I can assist you in the future.   Referrals/Orders/Follow-Ups/Clinician Recommendations: It was nice talking with you today.  You are due for a tetanus and Shingles vaccine.  You can get these at your local pharmacy.  Keep up the good work.  This is a list of the screening recommended for you and due dates:  Health Maintenance  Topic Date Due   Zoster (Shingles) Vaccine (1 of 2) 04/17/1970   DEXA scan (bone density measurement)  Never done   Mammogram  04/13/2020   DTaP/Tdap/Td vaccine (2 - Td or Tdap) 03/29/2022   COVID-19 Vaccine (4 - 2024-25 season) 10/31/2022   Flu Shot  09/30/2023   Medicare Annual Wellness Visit  06/26/2024   Colon Cancer Screening  11/30/2025   Pneumonia Vaccine  Completed   Hepatitis C Screening  Completed   HPV Vaccine  Aged Out   Meningitis B Vaccine  Aged Out    Advanced directives: (Copy Requested) Please bring a copy of your health care power of attorney and living will to the office to be added to your chart at your convenience. You can mail to [facility] [address] [place], [place] [address] or email to [email or web address]  Next Medicare Annual Wellness Visit scheduled for next year: Yes

## 2023-10-12 ENCOUNTER — Encounter: Payer: Self-pay | Admitting: Internal Medicine

## 2023-10-12 ENCOUNTER — Ambulatory Visit (INDEPENDENT_AMBULATORY_CARE_PROVIDER_SITE_OTHER): Admitting: Internal Medicine

## 2023-10-12 VITALS — BP 138/88 | HR 81 | Temp 98.3°F | Ht 64.0 in | Wt 125.0 lb

## 2023-10-12 DIAGNOSIS — M8580 Other specified disorders of bone density and structure, unspecified site: Secondary | ICD-10-CM | POA: Diagnosis not present

## 2023-10-12 DIAGNOSIS — Z0000 Encounter for general adult medical examination without abnormal findings: Secondary | ICD-10-CM | POA: Diagnosis not present

## 2023-10-12 DIAGNOSIS — I10 Essential (primary) hypertension: Secondary | ICD-10-CM | POA: Diagnosis not present

## 2023-10-12 DIAGNOSIS — D0511 Intraductal carcinoma in situ of right breast: Secondary | ICD-10-CM | POA: Diagnosis not present

## 2023-10-12 LAB — CBC WITH DIFFERENTIAL/PLATELET
Basophils Absolute: 0 K/uL (ref 0.0–0.1)
Basophils Relative: 0.9 % (ref 0.0–3.0)
Eosinophils Absolute: 0.1 K/uL (ref 0.0–0.7)
Eosinophils Relative: 1.1 % (ref 0.0–5.0)
HCT: 39.8 % (ref 36.0–46.0)
Hemoglobin: 12.6 g/dL (ref 12.0–15.0)
Lymphocytes Relative: 28.9 % (ref 12.0–46.0)
Lymphs Abs: 1.4 K/uL (ref 0.7–4.0)
MCHC: 31.7 g/dL (ref 30.0–36.0)
MCV: 85.5 fl (ref 78.0–100.0)
Monocytes Absolute: 0.5 K/uL (ref 0.1–1.0)
Monocytes Relative: 10.2 % (ref 3.0–12.0)
Neutro Abs: 2.9 K/uL (ref 1.4–7.7)
Neutrophils Relative %: 58.9 % (ref 43.0–77.0)
Platelets: 300 K/uL (ref 150.0–400.0)
RBC: 4.66 Mil/uL (ref 3.87–5.11)
RDW: 14.2 % (ref 11.5–15.5)
WBC: 5 K/uL (ref 4.0–10.5)

## 2023-10-12 LAB — COMPREHENSIVE METABOLIC PANEL WITH GFR
ALT: 10 U/L (ref 0–35)
AST: 15 U/L (ref 0–37)
Albumin: 4.2 g/dL (ref 3.5–5.2)
Alkaline Phosphatase: 103 U/L (ref 39–117)
BUN: 10 mg/dL (ref 6–23)
CO2: 30 meq/L (ref 19–32)
Calcium: 9.3 mg/dL (ref 8.4–10.5)
Chloride: 105 meq/L (ref 96–112)
Creatinine, Ser: 0.78 mg/dL (ref 0.40–1.20)
GFR: 75.85 mL/min (ref >60.00)
Glucose, Bld: 89 mg/dL (ref 70–99)
Potassium: 4.4 meq/L (ref 3.5–5.1)
Sodium: 142 meq/L (ref 135–145)
Total Bilirubin: 0.4 mg/dL (ref 0.2–1.2)
Total Protein: 7.1 g/dL (ref 6.0–8.3)

## 2023-10-12 LAB — LIPID PANEL
Cholesterol: 189 mg/dL (ref 0–200)
HDL: 74.1 mg/dL (ref >39.00)
LDL Cholesterol: 102 mg/dL — ABNORMAL HIGH (ref 0–99)
NonHDL: 114.6
Total CHOL/HDL Ratio: 3
Triglycerides: 65 mg/dL (ref 0.0–149.0)
VLDL: 13 mg/dL (ref 0.0–40.0)

## 2023-10-12 LAB — URINALYSIS
Ketones, ur: 15 — AB
Leukocytes,Ua: NEGATIVE
Nitrite: NEGATIVE
Specific Gravity, Urine: 1.02 (ref 1.000–1.030)
Total Protein, Urine: NEGATIVE
Urine Glucose: NEGATIVE
Urobilinogen, UA: 0.2 (ref 0.0–1.0)
pH: 6 (ref 5.0–8.0)

## 2023-10-12 LAB — TSH: TSH: 1.69 u[IU]/mL (ref 0.35–5.50)

## 2023-10-12 MED ORDER — AMLODIPINE BESYLATE 2.5 MG PO TABS: 2.5000 mg | ORAL_TABLET | Freq: Every day | ORAL | 3 refills | Status: AC

## 2023-10-12 NOTE — Assessment & Plan Note (Signed)
  We discussed age appropriate health related issues, including available/recomended screening tests and vaccinations. Labs were ordered to be later reviewed . All questions were answered. We discussed one or more of the following - seat belt use, use of sunscreen/sun exposure exercise, fall risk reduction, second hand smoke exposure, firearm use and storage, seat belt use, a need for adhering to healthy diet and exercise. Labs were ordered.  All questions were answered. Dr. Cliffton Asters (06/20/2020) - Exploratory laparotomy with sigmoidectomy, takedown of splenic flexure, partial omentectomy, creation of end descending colostomy  Prevnar PAP/mammo/DEXA - Dr Huntley Dec Colon 2015, 2020 due 2027 Dr Russella Dar Last colon 2020, due 2027 Mammo in Sept A cardiac CT scan for calcium scoring offered - declined

## 2023-10-12 NOTE — Assessment & Plan Note (Signed)
 Take Vit D3+K2 Pt declined other Rx options

## 2023-10-12 NOTE — Assessment & Plan Note (Signed)
Chronic Cont on Amlodipine

## 2023-10-12 NOTE — Assessment & Plan Note (Signed)
 Mammo q 12 months

## 2023-10-12 NOTE — Patient Instructions (Signed)
 Vit D3+K2

## 2023-10-12 NOTE — Progress Notes (Signed)
 Subjective:  Patient ID: [name], female    DOB: 1950-05-10  Age: 73 y.o. MRN: [id number]  CC: Annual Exam   HPI Halena Mohar presents for a well exam F/u HTN, osteopenia  Outpatient Medications Prior to Visit  Medication Sig Dispense Refill   b complex vitamins capsule Take 1 capsule by mouth daily.     Cholecalciferol  (VITAMIN D3) 125 MCG (5000 UT) CAPS Take 5,000 Units by mouth 2 (two) times a week. Monday and Friday     Krill Oil 1000 MG CAPS Take 1,000 mg by mouth every Monday, Wednesday, and Friday.     Magnesium  400 MG CAPS      Multiple Vitamin (MULTIVITAMIN ADULT PO)      Specialty Vitamins Products (BIOTIN PLUS KERATIN PO) Take 1 capsule by mouth daily.     Turmeric 500 MG CAPS Take 500 mg by mouth daily.     amLODipine  (NORVASC ) 2.5 MG tablet TAKE 1 TABLET BY MOUTH EVERY DAY 90 tablet 3   Fish Oil-Krill Oil (KRILL OIL PLUS PO)  (Patient not taking: Reported on 10/12/2023)     No facility-administered medications prior to visit.    ROS: Review of Systems  Constitutional:  Positive for fatigue. Negative for activity change, appetite change, chills and unexpected weight change.  HENT:  Negative for congestion, mouth sores and sinus pressure.   Eyes:  Negative for visual disturbance.  Respiratory:  Negative for cough and chest tightness.   Gastrointestinal:  Negative for abdominal pain and nausea.  Genitourinary:  Negative for difficulty urinating, frequency and vaginal pain.  Musculoskeletal:  Negative for back pain and gait problem.  Skin:  Negative for pallor and rash.  Neurological:  Negative for dizziness, tremors, weakness, numbness and headaches.  Psychiatric/Behavioral:  Negative for confusion and sleep disturbance.     Objective:  BP 138/88   Pulse 81   Temp 98.3 F (36.8 C) (Oral)   Ht 5' 4 (1.626 m)   Wt 125 lb (56.7 kg)   SpO2 98%   BMI 21.46 kg/m   BP Readings from Last 3 Encounters:  10/12/23 138/88  04/16/23 (!) 160/96  09/15/22  120/80    Wt Readings from Last 3 Encounters:  10/12/23 125 lb (56.7 kg)  06/27/23 122 lb (55.3 kg)  04/16/23 122 lb (55.3 kg)    Physical Exam Constitutional:      General: She is not in acute distress.    Appearance: Normal appearance. She is well-developed.  HENT:     Head: Normocephalic.     Right Ear: External ear normal.     Left Ear: External ear normal.     Nose: Nose normal.  Eyes:     General:        Right eye: No discharge.        Left eye: No discharge.     Conjunctiva/sclera: Conjunctivae normal.     Pupils: Pupils are equal, round, and reactive to light.  Neck:     Thyroid : No thyromegaly.     Vascular: No JVD.     Trachea: No tracheal deviation.  Cardiovascular:     Rate and Rhythm: Normal rate and regular rhythm.     Heart sounds: Normal heart sounds.  Pulmonary:     Effort: No respiratory distress.     Breath sounds: No stridor. No wheezing.  Abdominal:     General: Bowel sounds are normal. There is no distension.     Palpations: Abdomen is soft. There is no  mass.     Tenderness: There is no abdominal tenderness. There is no guarding or rebound.  Musculoskeletal:        General: No tenderness.     Cervical back: Normal range of motion and neck supple. No rigidity.  Lymphadenopathy:     Cervical: No cervical adenopathy.  Skin:    Findings: No erythema or rash.  Neurological:     Cranial Nerves: No cranial nerve deficit.     Motor: No abnormal muscle tone.     Coordination: Coordination normal.     Deep Tendon Reflexes: Reflexes normal.  Psychiatric:        Behavior: Behavior normal.        Thought Content: Thought content normal.        Judgment: Judgment normal.     Lab Results  Component Value Date   WBC 5.4 09/15/2022   HGB 12.2 09/15/2022   HCT 38.2 09/15/2022   PLT 286.0 09/15/2022   GLUCOSE 93 09/15/2022   CHOL 212 (H) 09/15/2022   TRIG 60.0 09/15/2022   HDL 81.50 09/15/2022   LDLDIRECT 90.1 03/22/2012   LDLCALC 119 (H)  09/15/2022   ALT 10 09/15/2022   AST 16 09/15/2022   NA 142 09/15/2022   K 4.4 09/15/2022   CL 105 09/15/2022   CREATININE 0.73 09/15/2022   BUN 11 09/15/2022   CO2 30 09/15/2022   TSH 1.90 09/15/2022    No results found.  Assessment & Plan:   Problem List Items Addressed This Visit     Ductal carcinoma in situ (DCIS) of right breast   Mammo q 12 months      Essential hypertension   Chronic  Cont on Amlodipine       Relevant Medications   amLODipine  (NORVASC ) 2.5 MG tablet   Other Relevant Orders   TSH   Urinalysis   CBC with Differential/Platelet   Lipid panel   Comprehensive metabolic panel with GFR   Osteopenia   Take Vit D3+K2 Pt declined other Rx options      Well adult exam - Primary    We discussed age appropriate health related issues, including available/recomended screening tests and vaccinations. Labs were ordered to be later reviewed . All questions were answered. We discussed one or more of the following - seat belt use, use of sunscreen/sun exposure exercise, fall risk reduction, second hand smoke exposure, firearm use and storage, seat belt use, a need for adhering to healthy diet and exercise. Labs were ordered.  All questions were answered. Dr. Teresa (06/20/2020) - Exploratory laparotomy with sigmoidectomy, takedown of splenic flexure, partial omentectomy, creation of end descending colostomy  Prevnar PAP/mammo/DEXA - Dr Evangeline Colon 2015, 2020 due 2027 Dr Aneita Last colon 2020, due 2027 Mammo in Sept A cardiac CT scan for calcium scoring offered - declined      Relevant Orders   TSH   Urinalysis   CBC with Differential/Platelet   Lipid panel   Comprehensive metabolic panel with GFR      Meds ordered this encounter  Medications   amLODipine  (NORVASC ) 2.5 MG tablet    Sig: Take 1 tablet (2.5 mg total) by mouth daily.    Dispense:  90 tablet    Refill:  3      Follow-up: Return in about 1 year (around 10/11/2024) for Wellness  Exam.  Marolyn Noel, MD

## 2023-10-16 ENCOUNTER — Ambulatory Visit: Payer: Self-pay | Admitting: Internal Medicine

## 2024-06-27 ENCOUNTER — Ambulatory Visit
